# Patient Record
Sex: Female | Born: 1965
Health system: Southern US, Community
[De-identification: ages and names within clinical notes are randomized; demographics above are authoritative.]

## PROBLEM LIST (undated history)

## (undated) DIAGNOSIS — F329 Major depressive disorder, single episode, unspecified: Secondary | ICD-10-CM

## (undated) DIAGNOSIS — S83206A Unspecified tear of unspecified meniscus, current injury, right knee, initial encounter: Secondary | ICD-10-CM

## (undated) DIAGNOSIS — R112 Nausea with vomiting, unspecified: Secondary | ICD-10-CM

## (undated) DIAGNOSIS — M5412 Radiculopathy, cervical region: Secondary | ICD-10-CM

## (undated) DIAGNOSIS — F419 Anxiety disorder, unspecified: Secondary | ICD-10-CM

## (undated) DIAGNOSIS — N809 Endometriosis, unspecified: Secondary | ICD-10-CM

## (undated) DIAGNOSIS — F988 Other specified behavioral and emotional disorders with onset usually occurring in childhood and adolescence: Secondary | ICD-10-CM

## (undated) DIAGNOSIS — I10 Essential (primary) hypertension: Secondary | ICD-10-CM

## (undated) DIAGNOSIS — Z973 Presence of spectacles and contact lenses: Secondary | ICD-10-CM

## (undated) DIAGNOSIS — N3010 Interstitial cystitis (chronic) without hematuria: Secondary | ICD-10-CM

## (undated) DIAGNOSIS — F32A Depression, unspecified: Secondary | ICD-10-CM

## (undated) DIAGNOSIS — M1990 Unspecified osteoarthritis, unspecified site: Secondary | ICD-10-CM

## (undated) DIAGNOSIS — Z9889 Other specified postprocedural states: Secondary | ICD-10-CM

## (undated) DIAGNOSIS — M23221 Derangement of posterior horn of medial meniscus due to old tear or injury, right knee: Secondary | ICD-10-CM

## (undated) HISTORY — DX: Other specified postprocedural states: Z98.890

## (undated) HISTORY — PX: TOTAL ABDOMINAL HYSTERECTOMY W/ BILATERAL SALPINGOOPHORECTOMY: SHX83

## (undated) HISTORY — DX: Anxiety disorder, unspecified: F41.9

## (undated) HISTORY — PX: DIAGNOSTIC LAPAROSCOPY: SUR761

## (undated) HISTORY — DX: Derangement of posterior horn of medial meniscus due to old tear or injury, right knee: M23.221

## (undated) HISTORY — DX: Depression, unspecified: F32.A

## (undated) HISTORY — DX: Interstitial cystitis (chronic) without hematuria: N30.10

## (undated) HISTORY — DX: Radiculopathy, cervical region: M54.12

## (undated) HISTORY — PX: CYSTOSCOPY WITH HYDRODISTENSION AND BIOPSY: SHX5127

## (undated) HISTORY — PX: REDUCTION MAMMAPLASTY: SUR839

## (undated) HISTORY — PX: KNEE ARTHROSCOPY: SUR90

## (undated) HISTORY — PX: BLADDER SURGERY: SHX569

## (undated) HISTORY — DX: Major depressive disorder, single episode, unspecified: F32.9

## (undated) HISTORY — DX: Other specified behavioral and emotional disorders with onset usually occurring in childhood and adolescence: F98.8

## (undated) HISTORY — PX: SHOULDER ARTHROSCOPY: SHX128

---
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
## 2000-08-11 ENCOUNTER — Ambulatory Visit (HOSPITAL_COMMUNITY): Admission: RE | Admit: 2000-08-11 | Discharge: 2000-08-11 | Payer: Self-pay | Admitting: Obstetrics and Gynecology

## 2000-08-11 ENCOUNTER — Other Ambulatory Visit: Admission: RE | Admit: 2000-08-11 | Discharge: 2000-08-11 | Payer: Self-pay | Admitting: Obstetrics and Gynecology

## 2000-08-11 ENCOUNTER — Encounter: Payer: Self-pay | Admitting: Obstetrics and Gynecology

## 2003-11-10 ENCOUNTER — Encounter: Admission: RE | Admit: 2003-11-10 | Discharge: 2003-11-10 | Payer: Self-pay | Admitting: Family Medicine

## 2004-01-17 ENCOUNTER — Ambulatory Visit (HOSPITAL_COMMUNITY): Admission: RE | Admit: 2004-01-17 | Discharge: 2004-01-17 | Payer: Self-pay | Admitting: Urology

## 2004-01-17 ENCOUNTER — Ambulatory Visit (HOSPITAL_BASED_OUTPATIENT_CLINIC_OR_DEPARTMENT_OTHER): Admission: RE | Admit: 2004-01-17 | Discharge: 2004-01-17 | Payer: Self-pay | Admitting: Urology

## 2005-05-22 ENCOUNTER — Other Ambulatory Visit: Admission: RE | Admit: 2005-05-22 | Discharge: 2005-05-22 | Payer: Self-pay | Admitting: Family Medicine

## 2005-06-16 ENCOUNTER — Encounter: Admission: RE | Admit: 2005-06-16 | Discharge: 2005-06-16 | Payer: Self-pay | Admitting: Orthopedic Surgery

## 2006-04-14 ENCOUNTER — Ambulatory Visit (HOSPITAL_COMMUNITY): Admission: RE | Admit: 2006-04-14 | Discharge: 2006-04-14 | Payer: Self-pay | Admitting: Urology

## 2006-05-28 ENCOUNTER — Ambulatory Visit (HOSPITAL_COMMUNITY): Admission: RE | Admit: 2006-05-28 | Discharge: 2006-05-28 | Payer: Self-pay | Admitting: Obstetrics and Gynecology

## 2007-05-19 ENCOUNTER — Encounter (INDEPENDENT_AMBULATORY_CARE_PROVIDER_SITE_OTHER): Payer: Self-pay | Admitting: Obstetrics and Gynecology

## 2007-05-19 ENCOUNTER — Inpatient Hospital Stay (HOSPITAL_COMMUNITY): Admission: RE | Admit: 2007-05-19 | Discharge: 2007-05-21 | Payer: Self-pay | Admitting: Obstetrics and Gynecology

## 2008-03-07 ENCOUNTER — Ambulatory Visit (HOSPITAL_COMMUNITY): Admission: RE | Admit: 2008-03-07 | Discharge: 2008-03-07 | Payer: Self-pay | Admitting: Family Medicine

## 2008-03-14 ENCOUNTER — Encounter: Admission: RE | Admit: 2008-03-14 | Discharge: 2008-03-14 | Payer: Self-pay | Admitting: Family Medicine

## 2008-10-18 ENCOUNTER — Ambulatory Visit (HOSPITAL_COMMUNITY): Admission: RE | Admit: 2008-10-18 | Discharge: 2008-10-18 | Payer: Self-pay | Admitting: Orthopaedic Surgery

## 2008-11-02 ENCOUNTER — Ambulatory Visit (HOSPITAL_BASED_OUTPATIENT_CLINIC_OR_DEPARTMENT_OTHER): Admission: RE | Admit: 2008-11-02 | Discharge: 2008-11-02 | Payer: Self-pay | Admitting: Orthopaedic Surgery

## 2009-04-17 ENCOUNTER — Ambulatory Visit (HOSPITAL_COMMUNITY): Admission: RE | Admit: 2009-04-17 | Discharge: 2009-04-17 | Payer: Self-pay | Admitting: Family Medicine

## 2009-05-07 ENCOUNTER — Emergency Department (HOSPITAL_COMMUNITY): Admission: EM | Admit: 2009-05-07 | Discharge: 2009-05-07 | Payer: Self-pay | Admitting: Emergency Medicine

## 2009-06-25 ENCOUNTER — Encounter: Admission: RE | Admit: 2009-06-25 | Discharge: 2009-06-25 | Payer: Self-pay | Admitting: Orthopedic Surgery

## 2009-07-30 ENCOUNTER — Other Ambulatory Visit: Admission: RE | Admit: 2009-07-30 | Discharge: 2009-07-30 | Payer: Self-pay | Admitting: Family Medicine

## 2009-07-30 LAB — HM PAP SMEAR: HM Pap smear: NEGATIVE

## 2009-08-07 ENCOUNTER — Ambulatory Visit (HOSPITAL_BASED_OUTPATIENT_CLINIC_OR_DEPARTMENT_OTHER): Admission: RE | Admit: 2009-08-07 | Discharge: 2009-08-07 | Payer: Self-pay | Admitting: Orthopaedic Surgery

## 2009-08-22 ENCOUNTER — Encounter: Admission: RE | Admit: 2009-08-22 | Discharge: 2009-11-16 | Payer: Self-pay | Admitting: Orthopaedic Surgery

## 2009-11-15 ENCOUNTER — Ambulatory Visit (HOSPITAL_BASED_OUTPATIENT_CLINIC_OR_DEPARTMENT_OTHER): Admission: RE | Admit: 2009-11-15 | Discharge: 2009-11-15 | Payer: Self-pay | Admitting: Orthopaedic Surgery

## 2009-11-15 HISTORY — PX: CLOSED MANIPULATION SHOULDER: SUR205

## 2010-04-22 ENCOUNTER — Ambulatory Visit (HOSPITAL_COMMUNITY): Admission: RE | Admit: 2010-04-22 | Discharge: 2010-04-22 | Payer: Self-pay | Admitting: Family Medicine

## 2010-08-08 ENCOUNTER — Emergency Department (HOSPITAL_COMMUNITY): Admission: EM | Admit: 2010-08-08 | Discharge: 2010-08-08 | Payer: Self-pay | Admitting: Family Medicine
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
## 2011-01-14 ENCOUNTER — Other Ambulatory Visit (HOSPITAL_COMMUNITY): Payer: Self-pay | Admitting: Orthopaedic Surgery

## 2011-01-14 DIAGNOSIS — M75102 Unspecified rotator cuff tear or rupture of left shoulder, not specified as traumatic: Secondary | ICD-10-CM

## 2011-01-14 DIAGNOSIS — M25512 Pain in left shoulder: Secondary | ICD-10-CM

## 2011-02-03 ENCOUNTER — Ambulatory Visit (HOSPITAL_COMMUNITY)
Admission: RE | Admit: 2011-02-03 | Discharge: 2011-02-03 | Disposition: A | Discharge status: Home / Self Care | Payer: 59 | Source: Ambulatory Visit | Attending: Orthopaedic Surgery | Admitting: Orthopaedic Surgery

## 2011-02-03 ENCOUNTER — Other Ambulatory Visit (HOSPITAL_COMMUNITY): Payer: Self-pay

## 2011-02-03 DIAGNOSIS — X58XXXA Exposure to other specified factors, initial encounter: Secondary | ICD-10-CM | POA: Insufficient documentation

## 2011-02-03 DIAGNOSIS — S43439A Superior glenoid labrum lesion of unspecified shoulder, initial encounter: Secondary | ICD-10-CM | POA: Insufficient documentation

## 2011-02-03 DIAGNOSIS — M25519 Pain in unspecified shoulder: Secondary | ICD-10-CM | POA: Insufficient documentation

## 2011-02-03 DIAGNOSIS — M25512 Pain in left shoulder: Secondary | ICD-10-CM

## 2011-02-03 DIAGNOSIS — M75102 Unspecified rotator cuff tear or rupture of left shoulder, not specified as traumatic: Secondary | ICD-10-CM

## 2011-02-03 MED ORDER — GADOBENATE DIMEGLUMINE 529 MG/ML IV SOLN: 5.0000 mL | Freq: Once | INTRAVENOUS | Status: AC | PRN

## 2011-02-03 MED ORDER — IOHEXOL 300 MG/ML  SOLN
10.0000 mL | Freq: Once | INTRAMUSCULAR | Status: AC | PRN
  Administered 2011-02-03: 10 mL

## 2011-03-18 LAB — POCT HEMOGLOBIN-HEMACUE: Hemoglobin: 14.1 g/dL (ref 12.0–15.0)

## 2011-03-22 LAB — POCT HEMOGLOBIN-HEMACUE: Hemoglobin: 13.9 g/dL (ref 12.0–15.0)

## 2011-03-31 ENCOUNTER — Other Ambulatory Visit (HOSPITAL_COMMUNITY): Payer: Self-pay | Admitting: Family Medicine

## 2011-03-31 DIAGNOSIS — Z1231 Encounter for screening mammogram for malignant neoplasm of breast: Secondary | ICD-10-CM

## 2011-04-17 ENCOUNTER — Ambulatory Visit (HOSPITAL_BASED_OUTPATIENT_CLINIC_OR_DEPARTMENT_OTHER)
Admission: RE | Admit: 2011-04-17 | Discharge: 2011-04-17 | Disposition: A | Discharge status: Home / Self Care | Payer: 59 | Source: Ambulatory Visit | Attending: Orthopaedic Surgery | Admitting: Orthopaedic Surgery

## 2011-04-17 DIAGNOSIS — S43439A Superior glenoid labrum lesion of unspecified shoulder, initial encounter: Secondary | ICD-10-CM | POA: Insufficient documentation

## 2011-04-17 DIAGNOSIS — M7500 Adhesive capsulitis of unspecified shoulder: Secondary | ICD-10-CM | POA: Insufficient documentation

## 2011-04-17 DIAGNOSIS — X500XXA Overexertion from strenuous movement or load, initial encounter: Secondary | ICD-10-CM | POA: Insufficient documentation

## 2011-04-17 DIAGNOSIS — M67919 Unspecified disorder of synovium and tendon, unspecified shoulder: Secondary | ICD-10-CM | POA: Insufficient documentation

## 2011-04-17 DIAGNOSIS — Z01812 Encounter for preprocedural laboratory examination: Secondary | ICD-10-CM | POA: Insufficient documentation

## 2011-04-17 DIAGNOSIS — M719 Bursopathy, unspecified: Secondary | ICD-10-CM | POA: Insufficient documentation

## 2011-04-17 LAB — POCT HEMOGLOBIN-HEMACUE: Hemoglobin: 15.7 g/dL — ABNORMAL HIGH (ref 12.0–15.0)
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
## 2011-04-29 NOTE — Op Note (Signed)
NAME:  KAMERA, DUBAS                ACCOUNT NO.:  [id number]   MEDICAL RECORD NO.:  [id number]          PATIENT TYPE:  AMB   LOCATION:  DSC                          FACILITY:  [facility]   PHYSICIAN:  Claude Manges. Whitfield, M.D.DATE OF BIRTH:  [birth year]   DATE OF PROCEDURE:  11/02/2008  DATE OF DISCHARGE:                               OPERATIVE REPORT   PREOPERATIVE DIAGNOSES:  1. Possible tear of medial meniscus, left knee post.  2. Possible symptomatic medial shelf plica, left knee.   POSTOPERATIVE DIAGNOSES:  1. Tear on the knee surface of posterior horn of medial meniscus, left      knee.  2. Thickened medial shelf plica, left knee.   PROCEDURES:  1. Diagnostic arthroscopy, left knee.  2. Partial medial meniscectomy.  3. Plicectomy.   SURGEON:  Claude Manges. Whitfield, MD   ANESTHESIA:  IV sedation and local 1% Xylocaine with epinephrine.   COMPLICATIONS:  None.   HISTORY:  A 45 year old female has been experiencing pain along the  medial aspect of her left knee for approximately 6 weeks.  She has had  difficulty with running and is experiencing pain persistently along the  posterior medial aspect of her knee.  She has had some clicking, but no  sensation of her knee giving way.  She has had a cortisone injection at  time and anti-inflammatory medicines and still is having considerable  discomfort.  By exam, she is having posterior medial joint pain without  evidence of instability.   MRI scan was performed without contrast.  There was some tendinopathy of  the distal patellar tendon, but otherwise, there were no bony  abnormalities or evidence of meniscal or ligament pathology with  persistent discomfort.  She is now to have an arthroscopic evaluation.   PROCEDURE:  With the patient comfortable on operating table and under IV  sedation, the left lower extremity was placed in a thigh holder.  Leg  was then prepped with DuraPrep from thigh holder to ankle.  Sterile  draping was  performed.   Diagnostic arthroscopy was performed using a medial and lateral  parapatellar tendon puncture site.  The arthroscope was placed in a  lateral portal.  There was no evidence of an effusion.   Diagnostic arthroscopy revealed an intact cartilage above the patella.  There was some synovitis in the superior pouch, particularly medially  where there was a thickened plica that coursed along the medial gutter.  This was debrided with the ArthroCare wand back to stable capsule and  synovium.  There was no chondromalacia of the trochlear.   The lateral compartment was devoid of meniscal pathology or loose  bodies.  I did not see any appreciable chondromalacia.   The ACL appeared to be intact.  There were areas of synovitis on the  medial compartment and particularly anteriorly, these were debrided with  the ArthroCare wand.  I carefully evaluated the joint and I found  underneath surfaced tear at the junction of the middle and posterior  thirds of the meniscus.  It was very close to being through-and-through,  but it was definitely  ragged and was approximately a centimeter in  length.  I elected to debride this with a partial meniscectomy just  removing the meniscus back to that area and tapered it posteriorly and  anteriorly with the Cuda shaver and then the ArthroCare wand. So that I  could carefully probe the meniscus without evidence of any further  tearing or loose meniscus.  The joint was inspected without evidence of  loose material.  The 2 stab wounds were left open and infiltrated with  0.25% Marcaine with epinephrine.  A sterile bulky dressing was applied  followed by an Ace bandage.   PLAN:  Oxycodone for pain, aspirin a day, office Monday.      Claude Manges. Cleophas Dunker, M.D.  Electronically Signed     PWW/MEDQ  D:  11/02/2008  T:  11/03/2008  Job:  [id number]

## 2011-04-30 NOTE — Op Note (Signed)
NAME:  VALENTINE, KUECHLE NO.:  [id number]  MEDICAL RECORD NO.:  [id number]          PATIENT TYPE:  LOCATION:                                 FACILITY:  PHYSICIAN:  Claude Manges. Adriane Guglielmo, M.D.DATE OF BIRTH:  1966/08/07  DATE OF PROCEDURE:  04/17/2011 DATE OF DISCHARGE:                              OPERATIVE REPORT   PREOPERATIVE DIAGNOSIS:  Tear of anterior-superior glenoid labrum left shoulder with subacromial bursitis.  POSTOPERATIVE DIAGNOSES: 1. Adhesive capsulitis, left shoulder. 2. Subacromial bursitis. 3. Bucket-handle tear of glenoid labrum.  PROCEDURE: 1. Evaluation of left shoulder under anesthesia with manipulation. 2. Diagnostic arthroscopy, left shoulder with debridement of bucket     handle portion of labral tear. 3. Arthroscopic anterior glenoid labral repair. 4. Arthroscopic subacromial bursectomy.  SURGEON:  Claude Manges. Cleophas Dunker, MD  ASSISTANT:  Oris Drone. Petrarca, PA-C  ANESTHESIA:  General with supplemental interscalene nerve block.  COMPLICATIONS:  None.  HISTORY:  A 45 year old female sustained acute onset of left shoulder pain while performing a hand stand in January.  She felt immediate onset of something "tearing" in her shoulder.  She has had a subsequent MRI scan revealing a tear of the superior and anterior labrum and with persistent discomfort wishes to proceed with arthroscopic evaluation.  PROCEDURE:  The patient was evaluated in the holding area.  She did receive a preoperative interscalene nerve block, identified the left shoulder as appropriate operative extremity.  The patient was then transported to room #5 and placed under general orotracheal anesthesia and placed in a semi-sitting position with the shoulder frame. Evaluation of the shoulder was then performed.  There was considerable adhesive capsulitis lacking about 45 degrees to full overhead motion with loss of internal-external rotation and abduction.  Manipulation  was performed with soft lysis of adhesions, allowing then full range of motion.  The shoulder was then prepped with DuraPrep and the base of the neck circumferentially below the elbow.  Sterile draping was performed.  Marking pen was then used to outline the [facility] joint.  The coracoid and the acromion had a pointed fingerbreadth posterior and medial to the posterior angle of acromion, a small stab wound was made.  The arthroscope was then easily placed in the shoulder joint.  Hemarthrosis was evacuated from the manipulation.  Diagnostic arthroscopy revealed a bucket-handle tear of the labrum.  The injury occurred in January and there was some scarring.  It appeared as though the tear was posteriorly with one limb just anterior to the biceps tendon attachment and the other at about 4 o'clock posteriorly, but the remainder of the posterior labrum appeared to be intact.  There was more of an irregular appearance of the remaining anterior labrum, but it looked otherwise intact, so it was difficult to know if this was a bucket-handle tear of either the anterior posterior labrum that had flipped and then reattached.  In any rate, second portal was established anteriorly and I debrided the bucket- handle portion as it was in a white-white zone.  I then switched the scope from the posterior to the anterior portal and evaluated the posterior labrum and it  appeared to be otherwise intact.  There was tearing just at the biceps anchor and extending slightly posteriorly, so I elected to repair that with the Arthrex PushLock anchor to maintain a scope to the anterior portal.  A larger cannula was then inserted posteriorly with switching sticks and the PushLock anchor was then inserted.  A very nice purchase on the labrum and subsequently had nice purchase within the superior glenoid rim.  After insertion of the PushLock anchor, I then probed the labrum with an excellent repair and it was nicely  adherent to the glenoid.  I then reevaluated the joint through the anterior portal and the arthroscope and felt that remainder of the joint looked just fine.  I then inserted the arthroscope in the subacromial space posteriorly, the cannula in the subacromial space anteriorly, and third portal established in lateral subacromial space.  An arthroscopic subacromial bursectomy was performed.  The [facility] joint had a nice capsule, it did not appear to be prominent, I did not see any significant inflammatory tissue and felt that the acromion was a type 1 without anterior overhang or spur formation.  The wound was then irrigated with saline solution.  The anterior and posterior portals were closed with interrupted 4-0 Ethilon.  Sterile bulky dressing was applied followed by a sling.  PLAN:  Oxycodone for pain this 1 week.     Claude Manges. Cleophas Dunker, M.D.     PWW/MEDQ  D:  04/17/2011  T:  04/17/2011  Job:  [id number]  Electronically Signed by Norlene Campbell M.D. on 04/30/2011 01:41:12 PM

## 2011-05-02 NOTE — Discharge Summary (Signed)
NAME:  [name] [name], [name] [name]                ACCOUNT NO.:  [id number]   MEDICAL RECORD NO.:  [id number]          PATIENT TYPE:  INP   LOCATION:  9314                          [facility]ACILITY:  [facility]   PHYSICIAN:  Charles A. Delcambre, MDDATE OF BIRTH:  1966-11-01   DATE OF ADMISSION:  05/19/2007  DATE OF DISCHARGE:  05/21/2007                               DISCHARGE SUMMARY   PRIMARY DISCHARGE DIAGNOSIS:  1. Endometriosis with pelvic pain.  2. Adenomyosis.   PROCEDURE:  Transabdominal hysterectomy and bilateral salpingo-  oophorectomy.   DISPOSITION:  The patient was discharged home to follow up in the office  in 48 hours to discontinue staples.   She was given a prescription for:  1. Vicodin 1-2 p.o. q.4 h. p.r.n., #40.  2. Motrin 800 mg one p.o. q.8 h. p.r.n., #30, refill x1.  3. Instructed to use over-the-counter Colace and laxative that      evening.   She was instructed to call for any temperature greater than 100 degrees,  incision redness or drainage, increased pain or vaginal bleeding.  She  is instructed no driving for 2 weeks, bath for 2 weeks, and no lifting  greater than 25 pounds for 1 month.   LABORATORY:  As noted above.  Postoperative hemoglobin 11.3, hematocrit  32.7.  Pathology did return as diagnosed above, adenomyosis.  There was  gross endometriosis in the pelvis noted at the time of operation.   HISTORY AND PHYSICAL:  Dictated on the chart.   HOSPITAL COURSE:  The patient was admitted and underwent surgeries as  noted above.  Postoperatively she had routine pain course, routine post-  op course.  PCA was used for Dilaudid for pain control overnight.  Post-  op day #1, this was stopped and she was given p.o. Percocet.  She seemed  to tolerate Vicodin better.  So, we switched to Vicodin on post-op day  #2.  Colace was started and surgery was explained on post-op day #1.  On  post-op day #2, she was doing well, spontaneous flatus, no bowel  movement, pain was controlled.   She was having no difficulty.  She was  tolerating her diet.  She had some itching on Percocet, therefore, was  changed that day to Vicodin as noted above.  Vitals were stable.  She  was discharged home to follow up in the office as noted above.      [name], MD  Electronically Signed     CAD/MEDQ  D:  06/24/2007  T:  06/24/2007  Job:  [id number]
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
## 2011-05-02 NOTE — H&P (Signed)
NAME:  [name] [name], [name] [name]                ACCOUNT NO.:  [id number]   MEDICAL RECORD NO.:  [id number]          PATIENT TYPE:  AMB   LOCATION:  SDC                           [facility]ACILITY[facility]  [facility]   PHYSICIAN:  Charles A. Delcambre, MDDATE OF BIRTH:  Dec 07, 1966   DATE OF ADMISSION:  05/19/2007  DATE OF DISCHARGE:                              HISTORY & PHYSICAL   HISTORY OF PRESENT ILLNESS:  The patient is to be admitted on May 19, 2007 to undergo transabdominal hysterectomy, bilateral salpingo-  oophorectomy for pelvic pain and known endometriosis. She give informed  consent. Accepts the risks of bowel and bladder damage, blood product  risks including hepatitis and HIV exposure, menopause onset, possible  hormone replacement therapy, DVT. Anesthesia risks will be discussed  with the anesthesiologist. She understands that I cannot guarantee 100%  pain relief. She has stopped taking Ginkgo biloba, which would put her  at increased risk for bleeding, 10 days ago.   PAST MEDICAL HISTORY:  Interstitial cystitis, endometriosis.   PAST SURGICAL HISTORY:  Breast reduction in 1988. Laparoscopy in 1988,  endometriosis noted. Knee surgery in 1996. Diagnostic laparoscopy  showing endometriosis again on May 28, 2006.   MEDICATIONS:  BuSpar dose not specified, once daily.   ALLERGIES:  NO KNOWN DRUG ALLERGIES.   SOCIAL HISTORY:  1 pack per day cigarette smoking. She has cut back. No  alcohol or drug use. No STD exposure in the past. She is married in a  monogamous relationship with her husband.   FAMILY HISTORY:  Father with hypertension. Otherwise, denies family  history of breast, uterus, ovary, cervix, colon cancer, lymphoma,  coronary artery disease, stroke, or diabetes.   REVIEW OF SYSTEMS:  She has problems with right lower quadrant pain  continuing and dyspareunia developing now. She denies fever, chills,  rashes, lesions, headaches, dizziness, seasonal allergies, chest pain,  shortness of  breath, wheezing, diarrhea, constipation, bleeding, melena,  hematochezia, urgency, frequency, dysuria, incontinence, hematuria,  emotional changes. When she needs to urinate, she needs to go quickly  and some pain associated with interstitial cystitis in this regard.   PHYSICAL EXAMINATION:  GENERAL:  Alert and oriented times three. No  distress.  VITAL SIGNS:  Blood pressure 128/78, respiratory rate 24, heart rate 72.  Afebrile.  HEENT:  Examination grossly within normal limits.  NECK:  Supple without thyromegaly or adenopathy.  LUNGS:  Clear bilaterally.  BACK:  No CVAT, vertebral bodies nontender to palpation.  BREAST:  No masses, tenderness, or discharge.  SKIN:  No changes bilaterally.  ABDOMEN:  Soft, flat, and nontender. No hepatosplenomegaly or masses  noted. Lower quadrant is minimally tender to deep palpation.  HEART:  Regular rate and rhythm. Without murmur, rub, or gallop.  PELVIC:  Normal external genitalia. Bartholin, urethral, Skene within  normal limits. Vulva without discharge or lesions. No cervical motion  tenderness. Nulliparous cervix. Uterus not enlarged. Adnexa nontender  without masses. Ovaries palpable, normal size bilaterally. Posterior cul-  de-sac nodularities palpable and reproduces dyspareunia pain, consistent  with endometriosis.   I discussed LAVH versus vaginal hysteroscopy and BSO, but  as there are  multiple lesions seen with the laparoscopy, she wishes to undergo  transabdominal approach.   ASSESSMENT:  Pelvic pain and endometriosis, dyspareunia.   PLAN:  Transabdominal hysterectomy and bilateral salpingo-oophorectomy  as noted above. Informed consent is given. Preoperative H&P is dictated.  CBC, BMP, and SCD's will be used perioperatively. Serum HCG will be done  as well. NPO past midnight, evening prior to surgery.      [name], MD  Electronically Signed     CAD/MEDQ  D:  05/11/2007  T:  05/11/2007  Job:  [id number]

## 2011-05-02 NOTE — H&P (Signed)
NAME:  [name] [name], [name] [name]                ACCOUNT NO.:  [id number]   MEDICAL RECORD NO.:  [id number]          PATIENT TYPE:  AMB   LOCATION:  [facility]                           FACILITY:  [facility]   PHYSICIAN:  Charles A. Delcambre, MDDATE OF BIRTH:  10/26/1966   DATE OF ADMISSION:  DATE OF DISCHARGE:                                HISTORY & PHYSICAL   HISTORY OF PRESENT ILLNESS:  A 45 year old para 2-0-0-2 complaining of  pelvic pain, mainly in the right lower quadrant, and a non-complex ovarian  cyst at 3.2 cm, felt to be hemorrhagic versus endometriosis.  This was seen  on ultrasound, and she states her pain over the last several months has not  gone away.  It was initially noted to be a cyst with the urologist, for  which she is managed with interstitial cystitis.  LMP on Apr 27, 2006.  The  pain is constant throughout the month.  Occasional sharp and stabbing pain  with increase in urination and increase with intercourse radiating around  the side to the back low lumbar area on the right.   PAST MEDICAL HISTORY:  Interstitial cystitis and endometriosis documented at  previously laparoscopy.   PAST SURGICAL HISTORY:  1.  Breast reduction in 1988.  2.  Laparoscopy in 1988.  Endometriosis noted.  3.  Knee surgery in 1996.   MEDICATIONS:  BuSpar once a day, dose unspecified.   ALLERGIES:  No known drug allergies.   SOCIAL HISTORY:  One pack per day of cigarettes smoking.  Denies alcohol or  drug use in the past.  The patient is married and in a monogamous  relationship with her husband.   FAMILY HISTORY:  Father with hypertension.  No family history of breast,  uterus, ovary, cervix, colon cancer or lymphoma.  No coronary artery  disease, stroke or diabetes.   REVIEW OF SYSTEMS:  Problems with right lower quadrant pain and complex  ovarian cyst.  CONSTITUTIONAL:  Denies fever or chills.  SKIN:  No new  rashes or lesions.  Denies headaches or dizziness.  Some seasonal allergies  are  present.  No chest pain or shortness of breath or wheezing.  No  diarrhea, constipation, bleeding or melena.  No urgency, frequency, dysuria,  incontinence, or hematuria.  No galacturia.  No emotional changes.   PHYSICAL EXAMINATION:  GENERAL:  Alert and oriented x3.  In no distress.  Mood and affect normal.  Development normal.  Nutrition normal.  Grooming  normal.  No deformities.  VITAL SIGNS:  Blood pressure 120/80, respirations 16, pulse 90.  Height 5  feet, 4.5 inches.  Weight 164 pounds.  HEENT:  Grossly within normal limits.  NECK:  Supple without thyromegaly or adenopathy.  LUNGS:  Clear bilaterally.  BACK:  No CVA tenderness.  BREASTS:  Symmetrical.  Otherwise deferred.  ABDOMEN:  Soft, flat, nontender in all quadrant, except right lower quadrant  and suprapubic area with some palpable tenderness, but no guarding or  rebound pain.  She states her pain is in the mid pubic area, as well as the  right lower quadrant.  There is some referred pain to the right adnexa when  examining the left adnexa.  The right adnexa - the ovary is palpable and  about 3 cm in size.  No appreciably enlarged size.  Very minimal tenderness  in palpating the ovary.  This does not reproduce her pain or her tenderness.  In palpating the bladder region again, there is pain that makes her have the  strong urgency to void.   ASSESSMENT:  1.  Pelvic pain, 625.9.  2.  Ovarian cyst versus hemorrhagic cyst, 236.2.  3.  Endometriosis likely, 617.9.  4.  Interstitial cystitis, 595.1.   PLAN:  I discussed management with ibuprofen and a trial of Lupron versus  laparoscopy.  The patient prefers proceeding on with laparoscopy, and she  gives informed consent.  Accepts the risks of infection, bleeding, bowel and  bladder and ureteral damage, blood product risks including hepatitis and HIV  risks.  I can give no 100% guarantee of a pain-free state.  Possible  oophorectomy or laparotomy if necessary has been  understood by the patient.  Informed consent is given.  She initially wanted a full procedure done the  day of surgery; however, we did not come to agreement on whether to proceed  on with a full procedure such as hysterectomy to be done on the day of  surgery.  However, she does consent to oophorectomy if this becomes  necessary.  I would prefer, if we are going to have to make an incision,  such as a Pfannenstiel incision, that we prefer to postpone this to a later  date and wait on a trial  of conservative procedure.  She was in agreement.  We will proceed as outlined.  CBC, type and screen, UCG will be done.  NPO  past midnight.      [name], MD  Electronically Signed     CAD/MEDQ  D:  05/27/2006  T:  05/27/2006  Job:  [id number]

## 2011-05-02 NOTE — Op Note (Signed)
NAME:  [name] [name], [name] [name]                ACCOUNT NO.:  [id number]   MEDICAL RECORD NO.:  [id number]          PATIENT TYPE:  AMB   LOCATION:  [facility]                           FACILITY:  [facility]   PHYSICIAN:  Charles A. Delcambre, MDDATE OF BIRTH:  09-08-1966   DATE OF PROCEDURE:  05/19/2007  DATE OF DISCHARGE:                               OPERATIVE REPORT   PREOPERATIVE DIAGNOSES:  1. Endometriosis.  2. Pelvic pain.  3. Dyspareunia, failing medical management.   POSTOPERATIVE DIAGNOSES:  1. Endometriosis.  2. Pelvic pain.  3. Dyspareunia, failing medical management.  4. Pelvic adhesive disease.   PROCEDURES:  1. Transabdominal hysterectomy and bilateral salpingo-oophorectomy.  2. Lysis of adhesions.   SURGEON:  [name], MD   ASSISTANT:  Gerald Leitz, MD   COMPLICATIONS:  None.   ESTIMATED BLOOD LOSS:  100 mL.   ANESTHESIA:  General by the endotracheal route.   SPECIMENS:  Uterus, tubes and ovaries to Pathology.   OPERATIVE FINDINGS:  Endometriosis implants of the superficial and  posterior cul-de-sac resected with the posterior aspect of the  peritoneum with closure of the cuff.  Thereafter, mild adhesions of the  sigmoid colon to the left adnexa.  Instrument, sponge and needle counts  were correct x2.   PROCEDURE:  The patient was taken to the operating room and placed in  the supine position.  A general anesthetic was induced without  difficulty and she was then sterilely prepped and draped.   A Pfannenstiel incision was made with a knife and carried down to the  fascia.  The fascia was incised with the knife and Mayo scissors.  The  rectus sheath was released superiorly and inferiorly with Mayo scissors  and electrocautery.  The rectus muscles were bluntly dissected in the  midline.  The peritoneum was tented up and entered without any damage to  the bowel and further extended with Metzenbaum scissors.  A Balfour  retractor was placed.  Three moist laps were used  to pack the bowel out  of the pelvis to get the bowel adequately mobilized on the left.  Careful dissection with Metzenbaum scissors was undertaken to free the  sigmoid colon up out of the pelvis.  This allowed mobilization with a  lap sponge to give adequate visualization in the pelvis.  The round  ligaments were transected on both sides with 0 Vicryl transfixation  stitches and opened with cautery.  The bladder was dropped down  anteriorly with Metzenbaum scissors.  The infundibulopelvic pedicles  were isolated clearly with ureters seen posteriorly on the inner aspect  of the peritoneum deep in the pelvis on both sides.  The  infundibulopelvic pedicles were taken with 0 Vicryl, a free tie and then  a transfixation stitch of 0 Vicryl.  Hemostasis was excellent.   Skeletonization of the bladder down in the lower uterine segment was  undertaken with Metzenbaum scissors.  A sponge forceps was used to push  the bladder down further.  This allowed skeletonization of the uterine  vessels on either side.  They were taken with 0 Vicryl simple stitch on  the left and a transfixation stitch on the right.  Hemostasis was  excellent.  Several remaining pedicles were taken down on either side  with a straight Valentine clamp and a transfixation stitch of 0 Vicryl.  The final of which was a curved Heaney across the uterosacral ligament  and vested into the vagina.  When cut, 0 Vicryl transfixation stitch was  placed, holding the uterosacral angle on either side.  The cervix was  amputated with Ocie Doyne scissors.  The cuff was then closed with  Richardson angle sutures of 0 Vicryl on either angle of the vagina.  A  running locking 0 Vicryl was then used to close the remainder of the  cuff without damage to surrounding tissue.  Irrigation was carried out  and hemostasis was verified at all points.  The peritoneum implants, as  grossly could be seen, were removed on the peritoneum prior to closure  of  the cuff, eliminating any visual evidence of any endometriosis  remaining after closure of the cuff.  The pedicle revealed good  hemostasis.  Irrigation was carried out.  All pedicles revealed good  hemostasis.   The round ligament and vaginal angle sutures were cut.  Laps were  removed.  The bladder blade was removed.  The Balfour retractor was  removed.  Minor electrocautery was used on the rectus muscle on the  right and, otherwise, subfascial hemostasis was excellent.  The fascia  was closed with #1 Vicryl running nonlocking suture.  Subcutaneous  irrigation was carried out and minor electrocautery was done.  The skin  was then closed in a subcuticular stitch of 3-0 Monocryl and Steri-  Strips were placed.   The patient was taken to recovery room in good condition, having  tolerated the procedure well.      [name], MD  Electronically Signed     CAD/MEDQ  D:  05/19/2007  T:  05/19/2007  Job:  [id number]

## 2011-05-02 NOTE — Op Note (Signed)
NAME:  [name] [name], DIVIRGILIO NO.:  [id number]   MEDICAL RECORD NO.:  [id number]                   PATIENT TYPE:  AMB   LOCATION:  [facility]                                 FACILITY:  [facility]   PHYSICIAN:  Valetta Fuller, M.D.               DATE OF BIRTH:  1965/12/27   DATE OF PROCEDURE:  01/17/2004  DATE OF DISCHARGE:                                 OPERATIVE REPORT   PREOPERATIVE DIAGNOSES:  1. Urinary frequency.  2. Pelvic pain.  3. Probable interstitial cystitis.   POSTOPERATIVE DIAGNOSES:  1. Urinary frequency.  2. Pelvic pain.  3. Probable interstitial cystitis.   PROCEDURE PERFORMED:  Cystoscopy, urethral dilation, hydraulic  overdistention of the bladder, instillation of Clorpactin, Pyridium, and  Marcaine.   SURGEON:  Valetta Fuller, M.D.   ANESTHESIA:  General.   INDICATIONS:  [name] is a 45 year old female who has had very long-  standing symptoms of urinary frequency, nocturia, and some urgency.  More  recently, she has had some intermittent pelvic pain and occasional  dyspareunia.  A CT scan has shown some small ovarian cystic disease but  nothing else specific within her pelvis.  We had been suspicious for  interstitial cystitis and recommended cystoscopy for both diagnostic, as  well as potentially therapeutic purposes.   TECHNIQUE AND FINDINGS:  The patient was brought to the operating room where  she had the successful induction of general anesthesia.  She was placed in  the lithotomy position and prepped and draped in the usual manner.  Urethral  dilation was performed to 26 [place].  She then underwent hydraulic  overdistention of the bladder for five minutes at 1 meter of water pressure.  Bladder capacity was measured at 550 mL, which was confirmed with a second  hydraulic overdistention.  The patient had diffuse, 3-4+ severe glomerular  hemorrhaging again throughout her whole bladder.  At the completion of the  hydraulic  overdistention, a red Robinson catheter was placed.  Clorpactin  was instilled under gravity drainage a few hundred mL at a time and then  drained out of her bladder.  We placed some Marcaine and Pyridium in her  bladder at the end of the procedure, and the patient received a BNO  suppository.  She was brought to the recovery room in a stable condition.                                               Valetta Fuller, M.D.    DSG/MEDQ  D:  01/17/2004  T:  01/17/2004  Job:  [id number]

## 2011-05-06 ENCOUNTER — Ambulatory Visit (HOSPITAL_COMMUNITY)
Admission: RE | Admit: 2011-05-06 | Discharge: 2011-05-06 | Disposition: A | Discharge status: Home / Self Care | Payer: 59 | Source: Ambulatory Visit | Attending: Family Medicine | Admitting: Family Medicine

## 2011-05-06 DIAGNOSIS — Z1231 Encounter for screening mammogram for malignant neoplasm of breast: Secondary | ICD-10-CM | POA: Insufficient documentation

## 2011-05-06 LAB — HM MAMMOGRAPHY: HM Mammogram: NEGATIVE

## 2011-08-04 ENCOUNTER — Encounter: Payer: Self-pay | Admitting: Family Medicine

## 2011-08-04 ENCOUNTER — Ambulatory Visit (INDEPENDENT_AMBULATORY_CARE_PROVIDER_SITE_OTHER): Payer: 59 | Admitting: Family Medicine

## 2011-08-04 VITALS — BP 116/80 | HR 67 | Ht 65.5 in | Wt 172.0 lb

## 2011-08-04 DIAGNOSIS — Z0000 Encounter for general adult medical examination without abnormal findings: Secondary | ICD-10-CM

## 2011-08-04 DIAGNOSIS — F909 Attention-deficit hyperactivity disorder, unspecified type: Secondary | ICD-10-CM

## 2011-08-04 DIAGNOSIS — N3010 Interstitial cystitis (chronic) without hematuria: Secondary | ICD-10-CM

## 2011-08-04 LAB — POCT URINALYSIS DIPSTICK
Bilirubin, UA: NEGATIVE
Blood, UA: NEGATIVE
Glucose, UA: NEGATIVE
Ketones, UA: NEGATIVE
Leukocytes, UA: NEGATIVE
Nitrite, UA: NEGATIVE
Protein, UA: NEGATIVE
Spec Grav, UA: 1.02
Urobilinogen, UA: NEGATIVE
pH, UA: 6

## 2011-08-04 MED ORDER — HYDROXYZINE HCL 25 MG PO TABS: ORAL_TABLET | ORAL | Status: DC

## 2011-08-04 NOTE — Patient Instructions (Signed)
Heat to your right elbow for 20 minutes 3 times per day. Do as many things as you can palms up and open. If it keeps bothering you, come back for possible injection

## 2011-08-04 NOTE — Progress Notes (Signed)
  Subjective:    Patient ID: [name] [name], female    DOB: 1966-09-13, 45 y.o.   MRN: [id number]  HPI She is here for a complete examination. She does have a history of ADD and presently is on  Vyvanse. She also has a history of interstitial cystitis and presently is on hydroxyzine for this. She has not seen her urologist in quite some time. She states again she gets up 3 or 4 times per night to urinate but upon further questioning it is unclear as to whether she is truly getting up because of bladder issues or other concerns. She is under a lot of stress dealing with her husband and his alcohol issues. Her son is in a rehabilitation program and apparently is doing quite well. She has had a hysterectomy and is on hormone replacement.   Review of Systems Negative except as above    Objective:   Physical Exam BP 116/80  Pulse 67  Ht 5' 5.5" (1.664 m)  Wt 172 lb (78.019 kg)  BMI 28.19 kg/m2  General Appearance:    Alert, cooperative, no distress, appears stated age  Head:    Normocephalic, without obvious abnormality, atraumatic  Eyes:    PERRL, conjunctiva/corneas clear, EOM's intact, fundi    benign  Ears:    Normal TM's and external ear canals  Nose:   Nares normal, mucosa normal, no drainage or sinus   tenderness  Throat:   Lips, mucosa, and tongue normal; teeth and gums normal  Neck:   Supple, no lymphadenopathy;  thyroid:  no   enlargement/tenderness/nodules; no carotid   bruit or JVD  Back:    Spine nontender, no curvature, ROM normal, no CVA     tenderness  Lungs:     Clear to auscultation bilaterally without wheezes, rales or     ronchi; respirations unlabored  Chest Wall:    No tenderness or deformity   Heart:    Regular rate and rhythm, S1 and S2 normal, no murmur, rub   or gallop  Breast Exam:    Deferred to GYN  Abdomen:     Soft, non-tender, nondistended, normoactive bowel sounds,    no masses, no hepatosplenomegaly  Genitalia:    Deferred to GYN     Extremities:   No  clubbing, cyanosis or edema  Pulses:   2+ and symmetric all extremities  Skin:   Skin color, texture, turgor normal, no rashes or lesions  Lymph nodes:   Cervical, supraclavicular, and axillary nodes normal  Neurologic:   CNII-XII intact, normal strength, sensation and gait; reflexes 2+ and symmetric throughout          Psych:   Normal mood, affect, hygiene and grooming.           Assessment & Plan:  ADD. Interstitial cystitis. Situational anxiety. I had a long discussion with her concerning the anxiety and how she is handling this. She recognizes that to a certain extent she is an inhaler with her husband. I strongly encouraged her to get involved in counseling. Will also send her to urology for a local on her interstitial cystitis. We will readdress he sleep issue after she has had a chance to clear up the interstitial cystitis. I will renew her hydroxyzine at least for the time being.

## 2011-08-05 ENCOUNTER — Telehealth: Payer: Self-pay

## 2011-08-05 LAB — CBC WITH DIFFERENTIAL/PLATELET
Basophils Absolute: 0 10*3/uL (ref 0.0–0.1)
Basophils Relative: 0 % (ref 0–1)
Eosinophils Absolute: 0.1 10*3/uL (ref 0.0–0.7)
Eosinophils Relative: 2 % (ref 0–5)
HCT: 44 % (ref 36.0–46.0)
Hemoglobin: 14.6 g/dL (ref 12.0–15.0)
Lymphocytes Relative: 39 % (ref 12–46)
Lymphs Abs: 2.7 10*3/uL (ref 0.7–4.0)
MCH: 30.6 pg (ref 26.0–34.0)
MCHC: 33.2 g/dL (ref 30.0–36.0)
MCV: 92.2 fL (ref 78.0–100.0)
Monocytes Absolute: 0.5 10*3/uL (ref 0.1–1.0)
Monocytes Relative: 7 % (ref 3–12)
Neutro Abs: 3.6 10*3/uL (ref 1.7–7.7)
Neutrophils Relative %: 52 % (ref 43–77)
Platelets: 243 10*3/uL (ref 150–400)
RBC: 4.77 MIL/uL (ref 3.87–5.11)
RDW: 12.8 % (ref 11.5–15.5)
WBC: 7 10*3/uL (ref 4.0–10.5)

## 2011-08-05 LAB — COMPREHENSIVE METABOLIC PANEL
ALT: 29 U/L (ref 0–35)
AST: 25 U/L (ref 0–37)
Albumin: 4.5 g/dL (ref 3.5–5.2)
Alkaline Phosphatase: 49 U/L (ref 39–117)
BUN: 14 mg/dL (ref 6–23)
CO2: 24 mEq/L (ref 19–32)
Calcium: 8.8 mg/dL (ref 8.4–10.5)
Chloride: 107 mEq/L (ref 96–112)
Creat: 0.92 mg/dL (ref 0.50–1.10)
Glucose, Bld: 92 mg/dL (ref 70–99)
Potassium: 4.2 mEq/L (ref 3.5–5.3)
Sodium: 136 mEq/L (ref 135–145)
Total Bilirubin: 0.4 mg/dL (ref 0.3–1.2)
Total Protein: 6.9 g/dL (ref 6.0–8.3)

## 2011-08-05 LAB — LIPID PANEL
Cholesterol: 154 mg/dL (ref 0–200)
HDL: 44 mg/dL (ref >39)
LDL Cholesterol: 96 mg/dL (ref 0–99)
Total CHOL/HDL Ratio: 3.5 Ratio
Triglycerides: 70 mg/dL (ref <150)
VLDL: 14 mg/dL (ref 0–40)

## 2011-08-05 NOTE — Telephone Encounter (Signed)
Called pt to inform blood work looks good left message

## 2011-08-22 ENCOUNTER — Telehealth: Payer: Self-pay

## 2011-08-22 ENCOUNTER — Encounter: Payer: Self-pay | Admitting: Family Medicine

## 2011-08-22 NOTE — Telephone Encounter (Signed)
Left message for pt she needs appt per jcl

## 2011-09-02 ENCOUNTER — Ambulatory Visit (INDEPENDENT_AMBULATORY_CARE_PROVIDER_SITE_OTHER): Payer: 59 | Admitting: Family Medicine

## 2011-09-02 ENCOUNTER — Encounter: Payer: Self-pay | Admitting: Family Medicine

## 2011-09-02 VITALS — BP 130/86 | HR 80 | Wt 172.0 lb

## 2011-09-02 DIAGNOSIS — F909 Attention-deficit hyperactivity disorder, unspecified type: Secondary | ICD-10-CM

## 2011-09-02 DIAGNOSIS — S40022A Contusion of left upper arm, initial encounter: Secondary | ICD-10-CM

## 2011-09-02 DIAGNOSIS — F419 Anxiety disorder, unspecified: Secondary | ICD-10-CM

## 2011-09-02 DIAGNOSIS — S40029A Contusion of unspecified upper arm, initial encounter: Secondary | ICD-10-CM

## 2011-09-02 DIAGNOSIS — F341 Dysthymic disorder: Secondary | ICD-10-CM

## 2011-09-02 DIAGNOSIS — F32A Depression, unspecified: Secondary | ICD-10-CM

## 2011-09-02 MED ORDER — BUPROPION HCL ER (SR) 150 MG PO TB12: 150.0000 mg | ORAL_TABLET | Freq: Two times a day (BID) | ORAL | Status: DC

## 2011-09-02 MED ORDER — LISDEXAMFETAMINE DIMESYLATE 30 MG PO CAPS: 30.0000 mg | ORAL_CAPSULE | ORAL | Status: DC

## 2011-09-02 NOTE — Patient Instructions (Signed)
Take the Wellbutrin  once per day for the first week and then start on twice a day. If you have any symptoms all call me .
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
## 2011-09-10 ENCOUNTER — Telehealth: Payer: Self-pay | Admitting: *Deleted

## 2011-09-10 NOTE — Telephone Encounter (Signed)
Done

## 2011-09-16 LAB — POCT HEMOGLOBIN-HEMACUE: Hemoglobin: 14.2

## 2011-09-24 ENCOUNTER — Telehealth: Payer: Self-pay | Admitting: Family Medicine

## 2011-09-24 MED ORDER — EST ESTROGENS-METHYLTEST 0.625-1.25 MG PO TABS: 1.0000 | ORAL_TABLET | Freq: Every day | ORAL | Status: DC

## 2011-09-25 NOTE — Telephone Encounter (Signed)
DONE

## 2011-10-02 LAB — CBC
HCT: 32.7 — ABNORMAL LOW
HCT: 37.2
Hemoglobin: 11.3 — ABNORMAL LOW
Hemoglobin: 12.8
MCHC: 34.4
MCHC: 34.6
MCV: 91.1
MCV: 91.8
Platelets: 181
Platelets: 236
RBC: 3.56 — ABNORMAL LOW
RBC: 4.09
RDW: 12.3
RDW: 12.7
WBC: 12.4 — ABNORMAL HIGH
WBC: 6.7

## 2011-10-02 LAB — BASIC METABOLIC PANEL
BUN: 10
CO2: 26
Calcium: 9.1
Chloride: 103
Creatinine, Ser: 0.79
GFR calc Af Amer: >60
GFR calc non Af Amer: >60
Glucose, Bld: 97
Potassium: 4
Sodium: 136

## 2011-10-02 LAB — TYPE AND SCREEN
ABO/RH(D): A POS
Antibody Screen: NEGATIVE

## 2011-10-02 LAB — HCG, SERUM, QUALITATIVE: Preg, Serum: NEGATIVE

## 2011-10-06 ENCOUNTER — Encounter: Payer: Self-pay | Admitting: Family Medicine

## 2011-10-07 ENCOUNTER — Encounter: Payer: Self-pay | Admitting: Family Medicine

## 2011-10-07 ENCOUNTER — Ambulatory Visit (INDEPENDENT_AMBULATORY_CARE_PROVIDER_SITE_OTHER): Payer: 59 | Admitting: Family Medicine

## 2011-10-07 ENCOUNTER — Telehealth: Payer: Self-pay | Admitting: Family Medicine

## 2011-10-07 VITALS — BP 122/82 | HR 64 | Wt 171.0 lb

## 2011-10-07 DIAGNOSIS — F32A Depression, unspecified: Secondary | ICD-10-CM

## 2011-10-07 DIAGNOSIS — F419 Anxiety disorder, unspecified: Secondary | ICD-10-CM

## 2011-10-07 DIAGNOSIS — F341 Dysthymic disorder: Secondary | ICD-10-CM

## 2011-10-07 MED ORDER — ZOLPIDEM TARTRATE 10 MG PO TABS: 10.0000 mg | ORAL_TABLET | Freq: Every evening | ORAL | Status: DC | PRN

## 2011-10-07 NOTE — Telephone Encounter (Signed)
Called Ambien 10 mg in to [facility] pharmacy for pt.

## 2011-10-07 NOTE — Patient Instructions (Signed)
Take the Wellbutrin in the morning both doses. I will give you Ambien to help with sleep . Take it for several days and then as needed after that. Turn here in a month.
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
## 2011-10-28 ENCOUNTER — Telehealth: Payer: Self-pay | Admitting: Family Medicine

## 2011-10-28 NOTE — Telephone Encounter (Signed)
Pt called and cancelled 2nd follow up appt.  She said meds were doing fine and doesn't think she needs to come in again.  Please advise pt if she does.

## 2011-10-28 NOTE — Telephone Encounter (Signed)
I will see her as needed

## 2011-11-11 ENCOUNTER — Ambulatory Visit (INDEPENDENT_AMBULATORY_CARE_PROVIDER_SITE_OTHER): Payer: 59 | Admitting: Family Medicine

## 2011-11-11 ENCOUNTER — Encounter: Payer: Self-pay | Admitting: Family Medicine

## 2011-11-11 ENCOUNTER — Inpatient Hospital Stay: Payer: 59 | Admitting: Family Medicine

## 2011-11-11 DIAGNOSIS — R079 Chest pain, unspecified: Secondary | ICD-10-CM

## 2011-11-11 NOTE — Patient Instructions (Signed)
Tylenol for the discomfort. If the symptoms worsened, go to the emergency room

## 2011-11-11 NOTE — Progress Notes (Signed)
  Subjective:    Patient ID: [name] [name], female    DOB: 01/08/1966, 45 y.o.   MRN: [id number]  HPI She has a one hour history of left-sided pressure sensation with a pressure sensation in the shoulder. No shortness of breath, sweating or weakness. Deep breathing actually makes the shoulder discomfort increase.   Review of Systems     Objective:   Physical Exam Alert and in no distress. No chest wall tenderness. Skin is warm and dry. Cardiac exam shows regular rhythm without murmurs or gallops. Lungs are clear to auscultation. EKG shows no acute changes      Assessment & Plan:  Probable musculoskeletal chest pain Commend, for her pain however if her symptoms worsen, I did recommend she be evaluated in the emergency room.

## 2011-11-26 ENCOUNTER — Telehealth: Payer: Self-pay | Admitting: Family Medicine

## 2011-11-26 MED ORDER — LISDEXAMFETAMINE DIMESYLATE 30 MG PO CAPS: 30.0000 mg | ORAL_CAPSULE | ORAL | Status: DC

## 2011-11-26 NOTE — Telephone Encounter (Signed)
TSD  

## 2011-11-26 NOTE — Telephone Encounter (Signed)
Vyvanse renewed 

## 2011-12-02 ENCOUNTER — Telehealth: Payer: Self-pay | Admitting: Internal Medicine

## 2011-12-02 MED ORDER — BUPROPION HCL ER (SR) 150 MG PO TB12: 150.0000 mg | ORAL_TABLET | Freq: Two times a day (BID) | ORAL | Status: DC

## 2011-12-02 NOTE — Telephone Encounter (Signed)
Wellbutrin renewed.

## 2011-12-17 ENCOUNTER — Other Ambulatory Visit: Payer: Self-pay

## 2011-12-17 ENCOUNTER — Other Ambulatory Visit: Payer: Self-pay | Admitting: Family Medicine

## 2011-12-17 MED ORDER — ZOLPIDEM TARTRATE 10 MG PO TABS: 10.0000 mg | ORAL_TABLET | Freq: Every evening | ORAL | Status: DC | PRN

## 2011-12-17 NOTE — Telephone Encounter (Signed)
Refill Ambien but no refill

## 2011-12-17 NOTE — Telephone Encounter (Signed)
Is this ok?

## 2011-12-17 NOTE — Telephone Encounter (Signed)
Called med in 

## 2012-02-13 ENCOUNTER — Other Ambulatory Visit: Payer: Self-pay | Admitting: Family Medicine

## 2012-02-13 NOTE — Telephone Encounter (Signed)
IS THIS OK 

## 2012-02-17 ENCOUNTER — Telehealth: Payer: Self-pay | Admitting: Family Medicine

## 2012-02-17 NOTE — Telephone Encounter (Signed)
Renew the Ambien 

## 2012-02-18 MED ORDER — ZOLPIDEM TARTRATE 10 MG PO TABS: 10.0000 mg | ORAL_TABLET | Freq: Every evening | ORAL | Status: DC | PRN

## 2012-02-18 NOTE — Telephone Encounter (Signed)
Called in med 

## 2012-02-23 ENCOUNTER — Telehealth: Payer: Self-pay | Admitting: Family Medicine

## 2012-02-23 NOTE — Telephone Encounter (Signed)
She thinks she is having hot flash symptoms. She is on hormone replacement. I recommended that she check with pharmacy to see if it's better to take on a full or empty stomach. If no improvement, she'll make an appointment.

## 2012-02-24 ENCOUNTER — Telehealth: Payer: Self-pay | Admitting: Family Medicine

## 2012-02-24 MED ORDER — LISDEXAMFETAMINE DIMESYLATE 30 MG PO CAPS: 30.0000 mg | ORAL_CAPSULE | ORAL | Status: DC

## 2012-02-24 NOTE — Telephone Encounter (Signed)
Vyvanse will be renewed

## 2012-03-04 ENCOUNTER — Telehealth: Payer: Self-pay | Admitting: Family Medicine

## 2012-03-04 NOTE — Telephone Encounter (Signed)
DONE

## 2012-03-22 ENCOUNTER — Telehealth: Payer: Self-pay | Admitting: Internal Medicine

## 2012-03-22 MED ORDER — EST ESTROGENS-METHYLTEST 0.625-1.25 MG PO TABS: 1.0000 | ORAL_TABLET | Freq: Every day | ORAL | Status: DC

## 2012-03-22 NOTE — Telephone Encounter (Signed)
HRT renewed

## 2012-03-23 ENCOUNTER — Telehealth: Payer: Self-pay | Admitting: Internal Medicine

## 2012-03-23 MED ORDER — EST ESTROGENS-METHYLTEST 0.625-1.25 MG PO TABS: 1.0000 | ORAL_TABLET | Freq: Every day | ORAL | Status: DC

## 2012-03-23 NOTE — Telephone Encounter (Signed)
Dr. Susann Givens sent the medicine to [facility] instead of [facility]. Called it in to [place] [facility].

## 2012-03-30 ENCOUNTER — Encounter: Payer: Self-pay | Admitting: Medical

## 2012-03-30 ENCOUNTER — Ambulatory Visit (INDEPENDENT_AMBULATORY_CARE_PROVIDER_SITE_OTHER): Payer: 59 | Admitting: Medical

## 2012-03-30 VITALS — BP 130/80 | HR 76 | Temp 98.5°F | Resp 16 | Wt 166.0 lb

## 2012-03-30 DIAGNOSIS — W57XXXA Bitten or stung by nonvenomous insect and other nonvenomous arthropods, initial encounter: Secondary | ICD-10-CM

## 2012-03-30 DIAGNOSIS — N898 Other specified noninflammatory disorders of vagina: Secondary | ICD-10-CM

## 2012-03-30 DIAGNOSIS — N899 Noninflammatory disorder of vagina, unspecified: Secondary | ICD-10-CM

## 2012-03-30 DIAGNOSIS — T148XXA Other injury of unspecified body region, initial encounter: Secondary | ICD-10-CM

## 2012-03-30 DIAGNOSIS — IMO0002 Reserved for concepts with insufficient information to code with codable children: Secondary | ICD-10-CM

## 2012-03-30 DIAGNOSIS — S30860A Insect bite (nonvenomous) of lower back and pelvis, initial encounter: Secondary | ICD-10-CM

## 2012-03-30 MED ORDER — MUPIROCIN 2 % EX OINT: TOPICAL_OINTMENT | Freq: Three times a day (TID) | CUTANEOUS | Status: DC

## 2012-03-30 MED ORDER — LIDOCAINE HCL 2 % EX GEL: CUTANEOUS | Status: DC | PRN

## 2012-03-30 MED ORDER — MUPIROCIN 2 % EX OINT: TOPICAL_OINTMENT | Freq: Three times a day (TID) | CUTANEOUS | Status: AC

## 2012-03-30 MED ORDER — LIDOCAINE HCL 2 % EX GEL
CUTANEOUS | Status: DC | PRN
Start: 2012-03-30 — End: 2012-03-30

## 2012-03-30 MED ORDER — DOXYCYCLINE HYCLATE 100 MG PO TABS: 100.0000 mg | ORAL_TABLET | Freq: Two times a day (BID) | ORAL | Status: DC

## 2012-03-30 NOTE — Progress Notes (Signed)
Subjective Here for possible tick in vaginal region.  She was outside yesterday doing yard work, over night felt something painful in vaginal region. Had husband look this morning, though he saw a tiny tick.  She notes discomfort, itching and irritation all day.  Husband does yard work and has ticks on him often.  She denies vaginal discharge, odor or any concern for STD or yeast infection.  No other c/o. No other aggravating or relieving factors.   No past medical history on file.  ROS negative  Objective: Gen: WD, WN, NAD Gyn: inferior left lateral vulva just slightly to left of labia with small 2mm papular erythematous lesion suggestive of insect bite. No obvious tick or insect.  Speculum exam with slight white discharge, no other lesions or insects, wet prep swab taken, exam chaperoned by nurse  Wet prep with some bacteria, epithelial, no yeasts, clue cells or trich.  Negative.  Assessment: Encounter Diagnoses  Name Primary?  . Insect bite of genital area Yes  . Vaginal irritation    Plan: Will treat as insect bite.  Advised warm soaks, can use topical lidocaine jelly, begin Mupirocin ointment and if it starts to get red, swollen and infected, then begin Doxycycline.  Otherwise call/return if not improving or worse.  Can use Ibuprofen OTC for pain.

## 2012-03-30 NOTE — Patient Instructions (Signed)
   Warm bath soaks with salt water  Use the antibiotic ointment Mupirocin 3 times daily  You can use lidocaine jelly topically for pain  Ibuprofen OTC 200mg , 3-4 tablets 3 times daily for pain  If redness, area looking bigger or infected, then begin Doxycycline antibiotic.

## 2012-04-05 ENCOUNTER — Other Ambulatory Visit: Payer: Self-pay | Admitting: Family Medicine

## 2012-04-05 DIAGNOSIS — Z1231 Encounter for screening mammogram for malignant neoplasm of breast: Secondary | ICD-10-CM

## 2012-04-07 ENCOUNTER — Ambulatory Visit (INDEPENDENT_AMBULATORY_CARE_PROVIDER_SITE_OTHER): Payer: 59 | Admitting: Family Medicine

## 2012-04-07 ENCOUNTER — Encounter: Payer: Self-pay | Admitting: Family Medicine

## 2012-04-07 VITALS — BP 114/70 | HR 78 | Wt 166.0 lb

## 2012-04-07 DIAGNOSIS — Z90710 Acquired absence of both cervix and uterus: Secondary | ICD-10-CM

## 2012-04-07 MED ORDER — ESTROGENS CONJUGATED 0.625 MG PO TABS: 0.6250 mg | ORAL_TABLET | Freq: Every day | ORAL | Status: DC

## 2012-04-07 NOTE — Progress Notes (Signed)
  Subjective:    Patient ID: [name] [name], female    DOB: [birth year], 46 y.o.   MRN: [id number]  HPI She is here for consultation. Apparently Estratest is no longer being made. She is interested in stopping hormone replacement. She had a total hysterectomy several years ago and has been on medication since then. She does have concerns over libido.   Review of Systems     Objective:   Physical Exam Alert and in no distress otherwise not examined       Assessment & Plan:   1. H/O hysterectomy for benign disease  estrogens, conjugated, (PREMARIN) 0.625 MG tablet   I discussed options with her. I will place her on Premarin. Discussed keeping her on this until roughly around age 24. She is comfortable with this. She will let me know if she notes any libido issues the

## 2012-04-07 NOTE — Patient Instructions (Signed)
Let me know if you notice this there is any change in your libido

## 2012-04-14 ENCOUNTER — Telehealth: Payer: Self-pay | Admitting: Internal Medicine

## 2012-04-14 MED ORDER — ZOLPIDEM TARTRATE 10 MG PO TABS: 10.0000 mg | ORAL_TABLET | Freq: Every evening | ORAL | Status: DC | PRN

## 2012-04-14 NOTE — Telephone Encounter (Signed)
Renew Ambien.

## 2012-04-14 NOTE — Telephone Encounter (Signed)
Med called in per jcl 

## 2012-05-17 ENCOUNTER — Ambulatory Visit (HOSPITAL_COMMUNITY): Payer: 59

## 2012-05-18 ENCOUNTER — Ambulatory Visit (HOSPITAL_COMMUNITY)
Admission: RE | Admit: 2012-05-18 | Discharge: 2012-05-18 | Disposition: A | Discharge status: Home / Self Care | Payer: 59 | Source: Ambulatory Visit | Attending: Family Medicine | Admitting: Family Medicine

## 2012-05-18 DIAGNOSIS — Z1231 Encounter for screening mammogram for malignant neoplasm of breast: Secondary | ICD-10-CM | POA: Insufficient documentation

## 2012-06-01 ENCOUNTER — Other Ambulatory Visit: Payer: Self-pay | Admitting: Family Medicine

## 2012-06-01 NOTE — Telephone Encounter (Signed)
Wellbutrin renewed.

## 2012-06-01 NOTE — Telephone Encounter (Signed)
Is this okay?

## 2012-06-02 ENCOUNTER — Telehealth: Payer: Self-pay | Admitting: Family Medicine

## 2012-06-02 MED ORDER — LISDEXAMFETAMINE DIMESYLATE 30 MG PO CAPS: 30.0000 mg | ORAL_CAPSULE | ORAL | Status: DC

## 2012-06-02 NOTE — Telephone Encounter (Signed)
Vyvanse renewed 

## 2012-06-02 NOTE — Telephone Encounter (Signed)
LM

## 2012-06-14 ENCOUNTER — Telehealth: Payer: Self-pay | Admitting: Family Medicine

## 2012-06-14 NOTE — Telephone Encounter (Signed)
Called pt left message for her to make appt to dicuss the med problem

## 2012-06-14 NOTE — Telephone Encounter (Signed)
Have her set up an appointment to discuss this.

## 2012-07-16 ENCOUNTER — Ambulatory Visit (INDEPENDENT_AMBULATORY_CARE_PROVIDER_SITE_OTHER): Payer: 59 | Admitting: Medical

## 2012-07-16 ENCOUNTER — Encounter: Payer: Self-pay | Admitting: Medical

## 2012-07-16 VITALS — BP 130/80 | HR 60 | Temp 98.2°F | Resp 16 | Wt 163.0 lb

## 2012-07-16 DIAGNOSIS — K3189 Other diseases of stomach and duodenum: Secondary | ICD-10-CM

## 2012-07-16 DIAGNOSIS — K2970 Gastritis, unspecified, without bleeding: Secondary | ICD-10-CM

## 2012-07-16 DIAGNOSIS — R1013 Epigastric pain: Secondary | ICD-10-CM

## 2012-07-16 DIAGNOSIS — K30 Functional dyspepsia: Secondary | ICD-10-CM

## 2012-07-16 DIAGNOSIS — R110 Nausea: Secondary | ICD-10-CM

## 2012-07-16 DIAGNOSIS — K2990 Gastroduodenitis, unspecified, without bleeding: Secondary | ICD-10-CM

## 2012-07-16 LAB — POCT URINALYSIS DIPSTICK
Bilirubin, UA: NEGATIVE
Glucose, UA: NEGATIVE
Ketones, UA: NEGATIVE
Leukocytes, UA: NEGATIVE
Nitrite, UA: NEGATIVE
Protein, UA: NEGATIVE
Spec Grav, UA: 1.02
Urobilinogen, UA: NEGATIVE
pH, UA: 5

## 2012-07-16 MED ORDER — PROMETHAZINE HCL 25 MG PO TABS: 25.0000 mg | ORAL_TABLET | Freq: Three times a day (TID) | ORAL | Status: DC | PRN

## 2012-07-16 MED ORDER — ESOMEPRAZOLE MAGNESIUM 40 MG PO CPDR: 40.0000 mg | DELAYED_RELEASE_CAPSULE | Freq: Every day | ORAL | Status: DC

## 2012-07-16 NOTE — Progress Notes (Signed)
Subjective:   HPI  [name] [name] is a 46 y.o. female who presents with c/o nausea and upset stomach.  Been feeling not quite right the past week or more with nausea, but worse nausea and upset stomach this week.  She notes nausea like when she had morning sickness in the past, feels very tired, pain right lower pelvic region.  Does have hx/o interstitial cystis though.  S/p complete hysterectomy.  Denies vomiting or diarrhea.   She thinks she had fever week before.  No other aggravating or relieving factors.    No other c/o.  The following portions of the patient's history were reviewed and updated as appropriate: allergies, current medications, past family history, past medical history, past social history, past surgical history and problem list.  No past medical history on file.  No Known Allergies  Review of Systems Constitutional: -fever, -chills, -sweats, -unexpected -weight change, +fatigue ENT: -runny nose, -ear pain, -sore throat Cardiology:  -chest pain, -palpitations, -edema Respiratory: -cough, -shortness of breath, -wheezing Gastroenterology: +generalized abdominal discomfort, +nausea, -vomiting, -diarrhea, -constipation  Hematology: -bleeding or bruising problems Musculoskeletal: -arthralgias, -myalgias, -joint swelling, -back pain Urology: +urinary frequency, +urgency Neurology: -headache, -weakness, -tingling, -numbness    Objective:   Physical Exam  General appearance: alert, no distress, WD/WN HEENT: normocephalic, sclerae anicteric, TMs pearly, nares patent, no discharge or erythema, pharynx normal Oral cavity: MMM, no lesions Neck: supple, no lymphadenopathy, no thyromegaly, no masses Heart: RRR, normal S1, S2, no murmurs Lungs: CTA bilaterally, no wheezes, rhonchi, or rales Abdomen: +bs, soft, mild generalized tenderness, non distended, no masses, no hepatomegaly, no splenomegaly Pulses: 2+ symmetric, upper and lower extremities, normal cap  refill   Assessment and Plan :     Encounter Diagnoses  Name Primary?  . Nausea Yes  . Upset stomach   . Gastritis    Urinalysis unremarkable today.  Most likely diagnosis is gastritis.  Advised she hydrate with water, begin samples of Nexium, can use Tums prn, and script for Phenergan sent just in case.   If not improving in 3-5 days, call or return.

## 2012-07-29 ENCOUNTER — Encounter: Payer: Self-pay | Admitting: Internal Medicine

## 2012-08-04 ENCOUNTER — Encounter: Payer: Self-pay | Admitting: Family Medicine

## 2012-08-04 ENCOUNTER — Ambulatory Visit (INDEPENDENT_AMBULATORY_CARE_PROVIDER_SITE_OTHER): Payer: 59 | Admitting: Family Medicine

## 2012-08-04 VITALS — BP 118/76 | HR 66 | Ht 64.5 in | Wt 162.0 lb

## 2012-08-04 DIAGNOSIS — F341 Dysthymic disorder: Secondary | ICD-10-CM

## 2012-08-04 DIAGNOSIS — Z0000 Encounter for general adult medical examination without abnormal findings: Secondary | ICD-10-CM

## 2012-08-04 DIAGNOSIS — G479 Sleep disorder, unspecified: Secondary | ICD-10-CM

## 2012-08-04 DIAGNOSIS — F988 Other specified behavioral and emotional disorders with onset usually occurring in childhood and adolescence: Secondary | ICD-10-CM

## 2012-08-04 DIAGNOSIS — N3010 Interstitial cystitis (chronic) without hematuria: Secondary | ICD-10-CM

## 2012-08-04 LAB — POCT URINALYSIS DIPSTICK
Bilirubin, UA: NEGATIVE
Blood, UA: NEGATIVE
Glucose, UA: NEGATIVE
Ketones, UA: NEGATIVE
Leukocytes, UA: NEGATIVE
Nitrite, UA: NEGATIVE
Protein, UA: NEGATIVE
Spec Grav, UA: 1.025
Urobilinogen, UA: NEGATIVE
pH, UA: 5

## 2012-08-04 LAB — HEMOCCULT GUIAC POC 1CARD (OFFICE)

## 2012-08-04 NOTE — Progress Notes (Signed)
Subjective:    Patient ID: [name] [name], female    DOB: 1966-04-30, 46 y.o.   MRN: [id number]  HPI She is here for complete examination. She continues on Wellbutrin and is doing quite nicely on that. She also has ADD and is using Vyvanse with good results. She has a long history of sleep disturbance dating back 20 years or more. She can fall sleep easily but has trouble staying asleep. In the past she has been using 2 Benadryl as well as Ambien to help with this. She states that she is using the Benadryl to help dry her out. She does wake up in the middle night and does urinate. She does have a history of interstitial cystitis but says this is usually triggered by certain foods. He has had some difficulty with right lower quadrant pain has been intermittent in nature but she cannot relate this to food, urinating, bowel movements. She has a one-month history of bilateral knee pain with clicking sensation. She's had previous surgery on her knees for meniscal damage. She's had no locking or grinding or effusion. Her home life is unchanged. Work is going well. Family and social history is unchanged.   Review of Systems Negative except as above    Objective:   Physical Exam BP 118/76  Pulse 66  Ht 5' 4.5" (1.638 m)  Wt 162 lb (73.483 kg)  BMI 27.38 kg/m2  SpO2 98%  General Appearance:    Alert, cooperative, no distress, appears stated age  Head:    Normocephalic, without obvious abnormality, atraumatic  Eyes:    PERRL, conjunctiva/corneas clear, EOM's intact, fundi    benign  Ears:    Normal TM's and external ear canals  Nose:   Nares normal, mucosa normal, no drainage or sinus   tenderness  Throat:   Lips, mucosa, and tongue normal; teeth and gums normal  Neck:   Supple, no lymphadenopathy;  thyroid:  no   enlargement/tenderness/nodules; no carotid   bruit or JVD  Back:    Spine nontender, no curvature, ROM normal, no CVA     tenderness  Lungs:     Clear to auscultation bilaterally  without wheezes, rales or     ronchi; respirations unlabored  Chest Wall:    No tenderness or deformity   Heart:    Regular rate and rhythm, S1 and S2 normal, no murmur, rub   or gallop  Breast Exam:    No tenderness, masses, or nipple discharge or inversion.      No axillary lymphadenopathy  Abdomen:     Soft, non-tender, nondistended, normoactive bowel sounds,    no masses, no hepatosplenomegaly  Genitalia:    Normal external genitalia without lesions.  BUS and vagina normal; cervix without lesions, or cervical motion tenderness. No abnormal vaginal discharge.  Uterus and adnexa not enlarged, nontender, no masses.  Pap performed  Rectal:    Normal tone, no masses or tenderness; guaiac negative stool  Extremities:   No clubbing, cyanosis or edema.lateral knee exam shows no effusion. McMurray's testing negative. Anterior drawer negative. Exam of the patella shows no pain.   Pulses:   2+ and symmetric all extremities  Skin:   Skin color, texture, turgor normal, no rashes or lesions  Lymph nodes:   Cervical, supraclavicular, and axillary nodes normal  Neurologic:   CNII-XII intact, normal strength, sensation and gait; reflexes 2+ and symmetric throughout          Psych:   Normal mood, affect, hygiene and  grooming.          Assessment & Plan:   1. Routine general medical examination at a health care facility  POCT Urinalysis Dipstick, Hemoccult - 1 Card (office)  2. Dysthymia    3. ADD (attention deficit disorder)    4. Sleep disturbance    5. IC (interstitial cystitis)    6  lateral knee pain recommend terminal extension exercises. We'll also continue on her ADD medicine as well as Wellbutrin. She will continue to monitor her food intake in regard to her interstitial cystitis. Return here if continued difficulty with knees. I also discussed sleep disturbance with her in detail. Information was given. Strongly encouraged her to stop the Benadryl. Recommend she use Ambien as needed rather  than regularly. There is a real question is whether it is doing any good anyway.

## 2012-08-04 NOTE — Patient Instructions (Addendum)
Insomnia Insomnia is frequent trouble falling and/or staying asleep. Insomnia can be a long term problem or a short term problem. Both are common. Insomnia can be a short term problem when the wakefulness is related to a certain stress or worry. Long term insomnia is often related to ongoing stress during waking hours and/or poor sleeping habits. Overtime, sleep deprivation itself can make the problem worse. Every little thing feels more severe because you are overtired and your ability to cope is decreased. CAUSES   Stress, anxiety, and depression.   Poor sleeping habits.   Distractions such as TV in the bedroom.   Naps close to bedtime.   Engaging in emotionally charged conversations before bed.   Technical reading before sleep.   Alcohol and other sedatives. They may make the problem worse. They can hurt normal sleep patterns and normal dream activity.   Stimulants such as caffeine for several hours prior to bedtime.   Pain syndromes and shortness of breath can cause insomnia.   Exercise late at night.   Changing time zones may cause sleeping problems (jet lag).  It is sometimes helpful to have someone observe your sleeping patterns. They should look for periods of not breathing during the night (sleep apnea). They should also look to see how long those periods last. If you live alone or observers are uncertain, you can also be observed at a sleep clinic where your sleep patterns will be professionally monitored. Sleep apnea requires a checkup and treatment. Give your caregivers your medical history. Give your caregivers observations your family has made about your sleep.  SYMPTOMS   Not feeling rested in the morning.   Anxiety and restlessness at bedtime.   Difficulty falling and staying asleep.  TREATMENT   Your caregiver may prescribe treatment for an underlying medical disorders. Your caregiver can give advice or help if you are using alcohol or other drugs for  self-medication. Treatment of underlying problems will usually eliminate insomnia problems.   Medications can be prescribed for short time use. They are generally not recommended for lengthy use.   Over-the-counter sleep medicines are not recommended for lengthy use. They can be habit forming.   You can promote easier sleeping by making lifestyle changes such as:   Using relaxation techniques that help with breathing and reduce muscle tension.   Exercising earlier in the day.   Changing your diet and the time of your last meal. No night time snacks.   Establish a regular time to go to bed.   Counseling can help with stressful problems and worry.   Soothing music and white noise may be helpful if there are background noises you cannot remove.   Stop tedious detailed work at least one hour before bedtime.  HOME CARE INSTRUCTIONS   Keep a diary. Inform your caregiver about your progress. This includes any medication side effects. See your caregiver regularly. Take note of:   Times when you are asleep.   Times when you are awake during the night.   The quality of your sleep.   How you feel the next day.  This information will help your caregiver care for you.  Get out of bed if you are still awake after 15 minutes. Read or do some quiet activity. Keep the lights down. Wait until you feel sleepy and go back to bed.   Keep regular sleeping and waking hours. Avoid naps.   Exercise regularly.   Avoid distractions at bedtime. Distractions include watching television or engaging   in any intense or detailed activity like attempting to balance the household checkbook.   Develop a bedtime ritual. Keep a familiar routine of bathing, brushing your teeth, climbing into bed at the same time each night, listening to soothing music. Routines increase the success of falling to sleep faster.   Use relaxation techniques. This can be using breathing and muscle tension release routines. It can  also include visualizing peaceful scenes. You can also help control troubling or intruding thoughts by keeping your mind occupied with boring or repetitive thoughts like the old concept of counting sheep. You can make it more creative like imagining planting one beautiful flower after another in your backyard garden.   During your day, work to eliminate stress. When this is not possible use some of the previous suggestions to help reduce the anxiety that accompanies stressful situations.  MAKE SURE YOU:   Understand these instructions.   Will watch your condition.   Will get help right away if you are not doing well or get worse.  Document Released: 11/28/2000 Document Revised: 11/20/2011 Document Reviewed: 12/29/2007 [facility] Patient Information 2012 [place], [place]. To the terminal extension exercises for your niece and see if that'll help with the pain. If you have continued difficulty with that, let me know. Also let me know how you're doing with your sleeping

## 2012-08-18 ENCOUNTER — Telehealth: Payer: Self-pay | Admitting: Internal Medicine

## 2012-08-18 MED ORDER — ZOLPIDEM TARTRATE 10 MG PO TABS: 10.0000 mg | ORAL_TABLET | Freq: Every evening | ORAL | Status: DC | PRN

## 2012-08-18 NOTE — Telephone Encounter (Signed)
Pt states that she can not sleep well without med at night and would like to go back on ambien 10mg . [facility]. Please contact pt and leave a message if she doesn't answer to know if he will refill that med or not

## 2012-08-18 NOTE — Telephone Encounter (Signed)
Call in a 30 day supply of Ambien with one refill. Reinforce with her the fact that she should use the Ambien as needed rather than regularly . Tell her to wait at least a half an hour after going to bed to see if she truly needs it before taking the pill

## 2012-08-18 NOTE — Telephone Encounter (Signed)
CALLED MED IN AND CALLED PT CELL # AND LEFT MESSAGE WORD FOR WORD .

## 2012-08-23 ENCOUNTER — Other Ambulatory Visit: Payer: Self-pay | Admitting: Family Medicine

## 2012-08-23 NOTE — Telephone Encounter (Signed)
Pt req refill for Estrates HS to [facility].

## 2012-09-08 ENCOUNTER — Telehealth: Payer: Self-pay | Admitting: Family Medicine

## 2012-09-08 ENCOUNTER — Other Ambulatory Visit: Payer: Self-pay | Admitting: Medical

## 2012-09-08 MED ORDER — LISDEXAMFETAMINE DIMESYLATE 30 MG PO CAPS: 30.0000 mg | ORAL_CAPSULE | ORAL | Status: DC

## 2012-09-21 ENCOUNTER — Other Ambulatory Visit: Payer: Self-pay | Admitting: Family Medicine

## 2012-09-21 NOTE — Telephone Encounter (Signed)
Is this ok?

## 2012-09-22 NOTE — Telephone Encounter (Signed)
DONE

## 2012-09-24 ENCOUNTER — Other Ambulatory Visit: Payer: Self-pay | Admitting: Family Medicine

## 2012-11-24 ENCOUNTER — Telehealth: Payer: Self-pay | Admitting: Internal Medicine

## 2012-11-25 MED ORDER — LISDEXAMFETAMINE DIMESYLATE 30 MG PO CAPS: 30.0000 mg | ORAL_CAPSULE | ORAL | Status: DC

## 2012-11-25 NOTE — Telephone Encounter (Signed)
Vyvanse renewed 

## 2012-11-29 ENCOUNTER — Telehealth: Payer: Self-pay | Admitting: Family Medicine

## 2012-11-29 ENCOUNTER — Other Ambulatory Visit: Payer: Self-pay | Admitting: Family Medicine

## 2012-11-29 MED ORDER — BUPROPION HCL ER (SR) 150 MG PO TB12: 150.0000 mg | ORAL_TABLET | Freq: Two times a day (BID) | ORAL | Status: DC

## 2012-11-29 NOTE — Telephone Encounter (Signed)
Sent in

## 2012-12-15 DIAGNOSIS — M5412 Radiculopathy, cervical region: Secondary | ICD-10-CM

## 2012-12-15 HISTORY — DX: Radiculopathy, cervical region: M54.12

## 2012-12-20 ENCOUNTER — Other Ambulatory Visit: Payer: Self-pay | Admitting: Family Medicine

## 2012-12-20 ENCOUNTER — Other Ambulatory Visit: Payer: Self-pay

## 2012-12-20 NOTE — Telephone Encounter (Signed)
CALLED IN AMBIEN PER JCL 

## 2012-12-20 NOTE — Telephone Encounter (Signed)
Okay to renew

## 2012-12-20 NOTE — Telephone Encounter (Signed)
IS THIS OK 

## 2013-01-09 ENCOUNTER — Other Ambulatory Visit: Payer: Self-pay | Admitting: Family Medicine

## 2013-01-09 MED ORDER — OSELTAMIVIR PHOSPHATE 75 MG PO CAPS: 75.0000 mg | ORAL_CAPSULE | Freq: Two times a day (BID) | ORAL | Status: DC

## 2013-01-09 NOTE — Progress Notes (Signed)
Tamiflu called in. Her daughter was diagnosed and now she has similar symptoms. Recommend she take anti-inflammatory of choice to help with her symptoms as well.

## 2013-01-25 ENCOUNTER — Ambulatory Visit (INDEPENDENT_AMBULATORY_CARE_PROVIDER_SITE_OTHER): Payer: 59 | Admitting: Family Medicine

## 2013-01-25 ENCOUNTER — Encounter: Payer: Self-pay | Admitting: Family Medicine

## 2013-01-25 VITALS — BP 120/80 | HR 80 | Ht 64.0 in | Wt 165.0 lb

## 2013-01-25 DIAGNOSIS — G479 Sleep disorder, unspecified: Secondary | ICD-10-CM

## 2013-01-25 DIAGNOSIS — M25561 Pain in right knee: Secondary | ICD-10-CM

## 2013-01-25 DIAGNOSIS — M25569 Pain in unspecified knee: Secondary | ICD-10-CM

## 2013-01-25 DIAGNOSIS — F988 Other specified behavioral and emotional disorders with onset usually occurring in childhood and adolescence: Secondary | ICD-10-CM

## 2013-01-25 DIAGNOSIS — F341 Dysthymic disorder: Secondary | ICD-10-CM

## 2013-01-25 DIAGNOSIS — N3010 Interstitial cystitis (chronic) without hematuria: Secondary | ICD-10-CM

## 2013-01-25 NOTE — Progress Notes (Signed)
  Subjective:    Patient ID: [name] [name], female    DOB: 10/10/1966, 47 y.o.   MRN: [id number]  HPI She is here to discuss multiple issues. She would like to be switched to plain Adderall as opposed to Vyvanse help cut down on her cost. She also continues on Wellbutrin and has been on this for quite some time to help with her underlying dysthymia. She does have underlying interstitial cystitis and finds that Benadryl at night helps with the nocturia. She also continues on 5 mg of Ambien to help with her sleep disturbance. She has had difficulty with this for over a year. She blames normal daily stresses for the need for the sleep medication. She states she has tried different sleep hygiene techniques without much success. She also complainsof bilateral knee pain however right greater than left. The pain is pretty constant but does get worse with physical activities. She has had previous knee surgery on both knees. She complains of a cracking sensation but no popping locking grinding or effusion.   Review of Systems     Objective:   Physical Exam Alert and in no distress. Exam of the right knee shows no effusion. Compression test negative. McMurray's test medially did cause discomfort. Anterior drawer negative. No effusion noted.       Assessment & Plan:   1. Dysthymia   2. ADD (attention deficit disorder)   3. Sleep disturbance   4. IC (interstitial cystitis)   5. Knee pain, acute, right    I gave her a discount card for Vyvanse for the next 6 months. We will readdress this at that time. Explained that switching from Vyvanse and Adderall can potentially take some time and effort. Also discussed sleep hygiene with her. I will refer her to a sleep specialist to help with this. Again discussed the fact that sleep is a learned behavior and she has learned to not sleep. Also discussed the use of Benadryl to help with her IC symptoms. Recommend terminal extension exercises for her knees to help  strengthen them. If continued difficulty with pain she is to return to see me. Sleep hygiene information given to patient again.

## 2013-01-25 NOTE — Patient Instructions (Signed)

## 2013-02-01 ENCOUNTER — Telehealth: Payer: Self-pay | Admitting: Family Medicine

## 2013-02-01 NOTE — Telephone Encounter (Signed)
Pt called she is not due to get another refill on Vyvanse until 03/08/13 and her Vyvanse card you gave her must be activated and used by Feb 28th and is only good through June.  Do you have a different card or can she switch to Adderall as the Vyvanse copay went from 4.00 to 25.00.  Please leave message on pt voice mail [phone]

## 2013-02-01 NOTE — Telephone Encounter (Signed)
See if you can help her with this. 

## 2013-02-01 NOTE — Telephone Encounter (Signed)
Dr.lalonde all the cards we have for vyvanse are suppose to be used the first time by Feb 11 2013 so please advise

## 2013-02-01 NOTE — Telephone Encounter (Signed)
It doesn't look like we can help with that card. Check with the company and see if they have any new cards coming out

## 2013-02-01 NOTE — Telephone Encounter (Signed)
Dr.lalonde all Vyvanse cards we have all have to used the first time by 02/11/13 please advise what you want to do

## 2013-02-03 NOTE — Telephone Encounter (Signed)
Dr.lalonde looked into company they have a new promo. But its more than what her copay is going to be please advise

## 2013-02-03 NOTE — Telephone Encounter (Signed)
Called pt back to let her know that the new card coming out is $ 30 copay  Does she want to change to shorter acting just call me back and let me know

## 2013-02-04 ENCOUNTER — Telehealth: Payer: Self-pay

## 2013-02-04 NOTE — Telephone Encounter (Signed)
PT CALLED AND SAID THAT SHE THOUGHT THAT THERE WAS XR ADDERALL THAT YOU TAKE 2 TIMES A DAY AND THAT SHE THOUGHT THAT YOU AND HER TALKED ABOUT IT SHE SAID SHE HAD 1 MORE MONTH LEFT NOT TO BE IN A RUSH  PLEASE ADVISE

## 2013-02-28 ENCOUNTER — Telehealth: Payer: Self-pay

## 2013-02-28 MED ORDER — AMPHETAMINE-DEXTROAMPHET ER 15 MG PO CP24: 15.0000 mg | ORAL_CAPSULE | ORAL | Status: DC

## 2013-02-28 MED ORDER — BUPROPION HCL ER (SR) 150 MG PO TB12: 150.0000 mg | ORAL_TABLET | Freq: Two times a day (BID) | ORAL | Status: DC

## 2013-02-28 NOTE — Telephone Encounter (Signed)
She would like to be switched Adderall XR because of costs. Usually the dosing is half of the Vyvanse dosing. This will be explained to her.

## 2013-02-28 NOTE — Telephone Encounter (Signed)
Pt called and stated she needs refill Wellbutrin and would like adderal xr  this time please advise

## 2013-02-28 NOTE — Telephone Encounter (Signed)
Call and let her noted to pick up the prescription for the Adderall. Explained to her that it is usually half the dosing of her Vyvanse. Have her let us know how well this works

## 2013-03-01 ENCOUNTER — Other Ambulatory Visit: Payer: Self-pay

## 2013-03-01 NOTE — Telephone Encounter (Signed)
Called and left message for pt that Rx is ready to pick up Wellbutrin was called in and adder all is usually 1/2 the dose of vayvanse and to call us back and let us know how it works

## 2013-03-02 ENCOUNTER — Telehealth: Payer: Self-pay | Admitting: Family Medicine

## 2013-03-02 NOTE — Telephone Encounter (Signed)
Called and requested prior auth for for generic adderall

## 2013-03-04 ENCOUNTER — Other Ambulatory Visit: Payer: Self-pay | Admitting: *Deleted

## 2013-03-04 DIAGNOSIS — R351 Nocturia: Secondary | ICD-10-CM

## 2013-03-04 DIAGNOSIS — G479 Sleep disorder, unspecified: Secondary | ICD-10-CM

## 2013-03-08 ENCOUNTER — Telehealth: Payer: Self-pay | Admitting: Family Medicine

## 2013-03-08 NOTE — Telephone Encounter (Signed)
LM

## 2013-03-22 ENCOUNTER — Other Ambulatory Visit: Payer: Self-pay | Admitting: Family Medicine

## 2013-04-05 ENCOUNTER — Telehealth: Payer: Self-pay | Admitting: Internal Medicine

## 2013-04-05 MED ORDER — AMPHETAMINE-DEXTROAMPHET ER 15 MG PO CP24: 15.0000 mg | ORAL_CAPSULE | ORAL | Status: DC

## 2013-04-05 NOTE — Telephone Encounter (Signed)
Pt needs a refill on adderall XR 15mg  24 hr. Call when ready

## 2013-04-05 NOTE — Telephone Encounter (Signed)
Adderall renewed.

## 2013-04-14 ENCOUNTER — Ambulatory Visit (INDEPENDENT_AMBULATORY_CARE_PROVIDER_SITE_OTHER): Payer: 59 | Admitting: Family Medicine

## 2013-04-14 ENCOUNTER — Encounter: Payer: Self-pay | Admitting: Family Medicine

## 2013-04-14 VITALS — BP 124/80 | Wt 164.0 lb

## 2013-04-14 DIAGNOSIS — L989 Disorder of the skin and subcutaneous tissue, unspecified: Secondary | ICD-10-CM

## 2013-04-14 DIAGNOSIS — R109 Unspecified abdominal pain: Secondary | ICD-10-CM

## 2013-04-14 MED ORDER — HYDROCODONE-ACETAMINOPHEN 5-325MG PREPACK (~~LOC~~: 1.0000 | ORAL_TABLET | Freq: Four times a day (QID) | ORAL | Status: DC | PRN

## 2013-04-14 NOTE — Patient Instructions (Signed)
Take 2 Aleve twice per day first and if that doesn't help then use of Vicodin mainly at night. You cannot use it and drive

## 2013-04-14 NOTE — Progress Notes (Signed)
  Subjective:    Patient ID: [name] [name], female    DOB: 05-Sep-1966, 47 y.o.   MRN: [id number]  HPI I. Days ago she noted the onset of right lateral rib pain. It hurts to take a deep breath or lie on that side. No fever, chills, cough or congestion. No nausea, abdominal pain or vomiting. No relation to food. No urgency or frequentShe did try Tylenol today without success. She also has a lesion on her left upper chest and she would like evaluated. She states that this recently occurred.   Review of Systems     Objective:   Physical Exam Exam of the left upper chest does show a 1 cm raised slightly waxy appearing lesion. Lungs are clear to auscultation. No chest or rib tenderness. Abdominal exam shows no masses or tenderness.       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 Also recommend supportive care for the ribs using Aleve 2 pills twice per day. I will also get some codeine.

## 2013-04-15 ENCOUNTER — Other Ambulatory Visit: Payer: Self-pay | Admitting: Family Medicine

## 2013-04-15 DIAGNOSIS — R109 Unspecified abdominal pain: Secondary | ICD-10-CM

## 2013-04-15 MED ORDER — HYDROCODONE-ACETAMINOPHEN 5-325MG PREPACK (~~LOC~~: ORAL_TABLET | ORAL | Status: DC

## 2013-04-18 ENCOUNTER — Telehealth: Payer: Self-pay | Admitting: Internal Medicine

## 2013-04-18 DIAGNOSIS — K829 Disease of gallbladder, unspecified: Secondary | ICD-10-CM

## 2013-04-18 DIAGNOSIS — R0781 Pleurodynia: Secondary | ICD-10-CM

## 2013-04-18 NOTE — Telephone Encounter (Signed)
Pt states she is still in a lot of pain. Over the weekend it has gotten worse and notices it get worst after she eats. It has moved from the lateral side to right under her right breast area also. She doesn't feel she has a fever but she gets hot and cold a lot. Pt wants to know what to do next?

## 2013-04-18 NOTE — Telephone Encounter (Signed)
Set her up for a gallbladder ultrasound

## 2013-04-19 ENCOUNTER — Other Ambulatory Visit: Payer: 59

## 2013-04-19 NOTE — Telephone Encounter (Signed)
I have scheduled pt today for gallbladder @ 11.15am @ [address] only if she hasnt eaten or drinking anything after midnight. i did call pt but she was going to call me back.

## 2013-04-19 NOTE — Telephone Encounter (Signed)
Pt was unable to go today due to eating breakfast. i recancelled her for Thursday but she states she is unable to go then. i gave her the number so she can call herself and schedule her appt

## 2013-04-20 ENCOUNTER — Other Ambulatory Visit: Payer: 59

## 2013-04-20 ENCOUNTER — Ambulatory Visit (HOSPITAL_COMMUNITY)
Admission: RE | Admit: 2013-04-20 | Discharge: 2013-04-20 | Disposition: A | Discharge status: Home / Self Care | Payer: 59 | Source: Ambulatory Visit | Attending: Family Medicine | Admitting: Family Medicine

## 2013-04-20 ENCOUNTER — Other Ambulatory Visit: Payer: Self-pay | Admitting: Family Medicine

## 2013-04-20 DIAGNOSIS — K829 Disease of gallbladder, unspecified: Secondary | ICD-10-CM

## 2013-04-20 DIAGNOSIS — R0781 Pleurodynia: Secondary | ICD-10-CM

## 2013-04-20 DIAGNOSIS — R1011 Right upper quadrant pain: Secondary | ICD-10-CM | POA: Insufficient documentation

## 2013-04-20 NOTE — Progress Notes (Signed)
Quick Note:  LEFT MESSAGE WORD FOR WORDwon't know what else I can do until after I evaluate her and decide ______

## 2013-04-21 ENCOUNTER — Other Ambulatory Visit: Payer: 59

## 2013-04-26 ENCOUNTER — Ambulatory Visit (INDEPENDENT_AMBULATORY_CARE_PROVIDER_SITE_OTHER): Payer: 59 | Admitting: Medical

## 2013-04-26 ENCOUNTER — Encounter: Payer: Self-pay | Admitting: Medical

## 2013-04-26 VITALS — BP 132/82 | HR 68 | Temp 98.2°F | Resp 16 | Wt 163.0 lb

## 2013-04-26 DIAGNOSIS — R110 Nausea: Secondary | ICD-10-CM

## 2013-04-26 DIAGNOSIS — R1011 Right upper quadrant pain: Secondary | ICD-10-CM

## 2013-04-26 LAB — CBC WITH DIFFERENTIAL/PLATELET
Basophils Absolute: 0 10*3/uL (ref 0.0–0.1)
Basophils Relative: 0 % (ref 0–1)
Eosinophils Absolute: 0.1 10*3/uL (ref 0.0–0.7)
Eosinophils Relative: 2 % (ref 0–5)
HCT: 41 % (ref 36.0–46.0)
Hemoglobin: 14.3 g/dL (ref 12.0–15.0)
Lymphocytes Relative: 39 % (ref 12–46)
Lymphs Abs: 2.7 10*3/uL (ref 0.7–4.0)
MCH: 30.7 pg (ref 26.0–34.0)
MCHC: 34.9 g/dL (ref 30.0–36.0)
MCV: 88 fL (ref 78.0–100.0)
Monocytes Absolute: 0.7 10*3/uL (ref 0.1–1.0)
Monocytes Relative: 10 % (ref 3–12)
Neutro Abs: 3.4 10*3/uL (ref 1.7–7.7)
Neutrophils Relative %: 49 % (ref 43–77)
Platelets: 235 10*3/uL (ref 150–400)
RBC: 4.66 MIL/uL (ref 3.87–5.11)
RDW: 13.2 % (ref 11.5–15.5)
WBC: 7 10*3/uL (ref 4.0–10.5)

## 2013-04-26 LAB — POCT URINALYSIS DIPSTICK
Bilirubin, UA: NEGATIVE
Blood, UA: NEGATIVE
Glucose, UA: NEGATIVE
Ketones, UA: NEGATIVE
Leukocytes, UA: NEGATIVE
Nitrite, UA: NEGATIVE
Protein, UA: NEGATIVE
Spec Grav, UA: 1.01
Urobilinogen, UA: NEGATIVE
pH, UA: 5

## 2013-04-26 NOTE — Progress Notes (Signed)
Subjective:  [name] [name] is a 47 y.o. female who presents for 2 wk hx/o sharp pain underneath right breast.  Saw Dr. Susann Givens for this recently, few weeks ago.   She ended up having RUQ ultrasound, was given hydrocodone for pain.  ultrasound was reportedly normal.  Pain has gotten worse.  She reports constant sharp pain under right breast, worse after meals, worse with deep breathing, worse at night lying, better when propped up on pillows.  No radiation of pain.   No other chest pain, no SOB, no DOE, no wheezing, no fever.  Has some nausea, no vomiting.  No diarrhea, no blood in stool.  No other BM changes.  Using heat.  hydrocodone at night.  She did recently switched from Vyvanse to Adderall.  No other aggravating or relieving factors.    No other c/o.  The following portions of the patient's history were reviewed and updated as appropriate: allergies, current medications, past family history, past medical history, past social history, past surgical history and problem list.  ROS Otherwise as in subjective above  Objective: Physical Exam  Vital signs reviewed  General appearence: alert, no distress, WD/WN Oral cavity: MMM, no lesions Neck: supple, no lymphadenopathy, no thyromegaly, no masses Heart: RRR, normal S1, S2, no murmurs Lungs: CTA bilaterally, no wheezes, rhonchi, or rales Abdomen: +bs, soft, RUQ tenderness, +murphy's sign, non distended, no masses, no hepatomegaly, no splenomegaly Back: nontender Pulses: 2+ radial pulses, 2+ pedal pulses, normal cap refill Ext: no edema   Assessment: Encounter Diagnoses  Name Primary?  . Abdominal pain, right upper quadrant Yes  . Nausea     Plan: Recent abdominal ultrasound normal.  Labs today.  advised that symptoms suggest gall bladder issue despite normal ultrasound.  Pending labs, consider HIDA, CXR, KUB.  C/t hydrocodone prn, advised clear liquid diet for today/tomorrow, BRAT diet if desired.    Follow up: pending labs

## 2013-04-27 ENCOUNTER — Other Ambulatory Visit: Payer: Self-pay | Admitting: Medical

## 2013-04-27 ENCOUNTER — Telehealth: Payer: Self-pay | Admitting: Medical

## 2013-04-27 DIAGNOSIS — R1011 Right upper quadrant pain: Secondary | ICD-10-CM

## 2013-04-27 LAB — HEPATIC FUNCTION PANEL
ALT: 54 U/L — ABNORMAL HIGH (ref 0–35)
AST: 25 U/L (ref 0–37)
Albumin: 4.5 g/dL (ref 3.5–5.2)
Alkaline Phosphatase: 59 U/L (ref 39–117)
Bilirubin, Direct: 0.1 mg/dL (ref 0.0–0.3)
Indirect Bilirubin: 0.2 mg/dL (ref 0.0–0.9)
Total Bilirubin: 0.3 mg/dL (ref 0.3–1.2)
Total Protein: 6.8 g/dL (ref 6.0–8.3)

## 2013-04-27 LAB — BASIC METABOLIC PANEL
BUN: 12 mg/dL (ref 6–23)
CO2: 27 mEq/L (ref 19–32)
Calcium: 9.9 mg/dL (ref 8.4–10.5)
Chloride: 103 mEq/L (ref 96–112)
Creat: 0.95 mg/dL (ref 0.50–1.10)
Glucose, Bld: 79 mg/dL (ref 70–99)
Potassium: 4 mEq/L (ref 3.5–5.3)
Sodium: 139 mEq/L (ref 135–145)

## 2013-04-27 NOTE — Telephone Encounter (Signed)
i called pt, answered her questions.  She will have KUB Tuesday.

## 2013-05-03 ENCOUNTER — Ambulatory Visit (HOSPITAL_COMMUNITY)
Admission: RE | Admit: 2013-05-03 | Discharge: 2013-05-03 | Disposition: A | Discharge status: Home / Self Care | Payer: 59 | Source: Ambulatory Visit | Attending: Medical | Admitting: Medical

## 2013-05-03 DIAGNOSIS — Z90710 Acquired absence of both cervix and uterus: Secondary | ICD-10-CM | POA: Insufficient documentation

## 2013-05-03 DIAGNOSIS — R1011 Right upper quadrant pain: Secondary | ICD-10-CM | POA: Insufficient documentation

## 2013-05-03 DIAGNOSIS — R110 Nausea: Secondary | ICD-10-CM | POA: Insufficient documentation

## 2013-05-06 ENCOUNTER — Other Ambulatory Visit: Payer: Self-pay | Admitting: Family Medicine

## 2013-05-10 NOTE — Telephone Encounter (Signed)
IS THIS OK 

## 2013-05-10 NOTE — Telephone Encounter (Signed)
Okay to renew

## 2013-05-16 ENCOUNTER — Ambulatory Visit: Payer: 59 | Admitting: Family Medicine

## 2013-05-27 ENCOUNTER — Emergency Department (HOSPITAL_COMMUNITY): Payer: 59

## 2013-05-27 ENCOUNTER — Emergency Department (HOSPITAL_COMMUNITY)
Admission: EM | Admit: 2013-05-27 | Discharge: 2013-05-28 | Disposition: A | Discharge status: Home / Self Care | Payer: 59 | Attending: Emergency Medicine | Admitting: Emergency Medicine

## 2013-05-27 ENCOUNTER — Encounter (HOSPITAL_COMMUNITY): Payer: Self-pay

## 2013-05-27 DIAGNOSIS — W010XXA Fall on same level from slipping, tripping and stumbling without subsequent striking against object, initial encounter: Secondary | ICD-10-CM | POA: Insufficient documentation

## 2013-05-27 DIAGNOSIS — S2239XA Fracture of one rib, unspecified side, initial encounter for closed fracture: Secondary | ICD-10-CM | POA: Insufficient documentation

## 2013-05-27 DIAGNOSIS — Y9301 Activity, walking, marching and hiking: Secondary | ICD-10-CM | POA: Insufficient documentation

## 2013-05-27 DIAGNOSIS — S2232XA Fracture of one rib, left side, initial encounter for closed fracture: Secondary | ICD-10-CM

## 2013-05-27 DIAGNOSIS — Z79899 Other long term (current) drug therapy: Secondary | ICD-10-CM | POA: Insufficient documentation

## 2013-05-27 DIAGNOSIS — F329 Major depressive disorder, single episode, unspecified: Secondary | ICD-10-CM | POA: Insufficient documentation

## 2013-05-27 DIAGNOSIS — Y92009 Unspecified place in unspecified non-institutional (private) residence as the place of occurrence of the external cause: Secondary | ICD-10-CM | POA: Insufficient documentation

## 2013-05-27 DIAGNOSIS — Z8719 Personal history of other diseases of the digestive system: Secondary | ICD-10-CM | POA: Insufficient documentation

## 2013-05-27 DIAGNOSIS — F3289 Other specified depressive episodes: Secondary | ICD-10-CM | POA: Insufficient documentation

## 2013-05-27 DIAGNOSIS — Z87891 Personal history of nicotine dependence: Secondary | ICD-10-CM | POA: Insufficient documentation

## 2013-05-27 NOTE — ED Notes (Signed)
Pt reports falling off step to ground and reports pain to L mid back. Pt feels as if something is loose or rib is cracked. Pt denies hitting head/LOC. No injury noted to L side. No tenderness felt on palpation.

## 2013-05-28 LAB — URINALYSIS, ROUTINE W REFLEX MICROSCOPIC
Bilirubin Urine: NEGATIVE
Glucose, UA: NEGATIVE mg/dL
Hgb urine dipstick: NEGATIVE
Ketones, ur: NEGATIVE mg/dL
Leukocytes, UA: NEGATIVE
Nitrite: NEGATIVE
Protein, ur: NEGATIVE mg/dL
Specific Gravity, Urine: 1.012 (ref 1.005–1.030)
Urobilinogen, UA: 0.2 mg/dL (ref 0.0–1.0)
pH: 8 (ref 5.0–8.0)

## 2013-05-28 MED ORDER — OXYCODONE-ACETAMINOPHEN 5-325 MG PO TABS
2.0000 | ORAL_TABLET | Freq: Once | ORAL | Status: AC
  Administered 2013-05-28: 2 via ORAL
  Filled 2013-05-28: qty 2

## 2013-05-28 MED ORDER — IBUPROFEN 800 MG PO TABS: 800.0000 mg | ORAL_TABLET | Freq: Three times a day (TID) | ORAL | Status: DC

## 2013-05-28 MED ORDER — TRAMADOL HCL 50 MG PO TABS: 50.0000 mg | ORAL_TABLET | Freq: Four times a day (QID) | ORAL | Status: DC | PRN

## 2013-05-28 MED ORDER — ONDANSETRON 8 MG PO TBDP
8.0000 mg | ORAL_TABLET | Freq: Once | ORAL | Status: AC
  Administered 2013-05-28: 8 mg via ORAL
  Filled 2013-05-28: qty 1

## 2013-05-28 NOTE — ED Notes (Signed)
Provider at bedside

## 2013-05-28 NOTE — ED Provider Notes (Signed)
History     CSN: [id number]  Arrival date & time 05/27/13  2249   First MD Initiated Contact with Patient 05/27/13 2353      Chief Complaint  Patient presents with  . Back Pain    (Consider location/radiation/quality/duration/timing/severity/associated sxs/prior treatment) HPI Comments: Patient is a 47 year old female with a history of depression who presents for left-sided chest wall pain after a fall at home 4 hours ago. Patient states that she was walking on her deck when she lost her footing and fell on her left side. Patient states she had onset of sharp pain in her left side shortly after, worse with deep inspiration and palpation and without alleviating factors. Patient denies hitting her head or loss of consciousness. Patient denies vision changes, dizziness, chest pain, shortness of breath, nausea or vomiting, abdominal pain, and numbness or tingling in her extremities.  Patient is a 47 y.o. female presenting with back pain. The history is provided by the patient. No language interpreter was used.  Back Pain Associated symptoms: no abdominal pain, no chest pain, no fever and no numbness     Past Medical History  Diagnosis Date  . Depression   . Interstitial cystitis     Past Surgical History  Procedure Laterality Date  . Abdominal hysterectomy    . Knee surgery      both knees  . Breast reduction surgery      age 71yo  . Bladder surgery      due to interstitial cystitis    History reviewed. No pertinent family history.  History  Substance Use Topics  . Smoking status: Former Smoker    Quit date: 08/04/1990  . Smokeless tobacco: Never Used  . Alcohol Use: Yes     Comment: rare    OB History   Grav Para Term Preterm Abortions TAB SAB Ect Mult Living                  Review of Systems  Constitutional: Negative for fever.  Respiratory: Negative for chest tightness and shortness of breath.   Cardiovascular: Negative for chest pain.  Gastrointestinal:  Negative for nausea, vomiting and abdominal pain.  Musculoskeletal: Positive for back pain.  Skin: Negative for color change.  Neurological: Negative for dizziness and numbness.  All other systems reviewed and are negative.    Allergies  Review of patient's allergies indicates no known allergies.  Home Medications   Current Outpatient Rx  Name  Route  Sig  Dispense  Refill  . amphetamine-dextroamphetamine (ADDERALL XR) 15 MG 24 hr capsule   Oral   Take 1 capsule (15 mg total) by mouth every morning.   30 capsule   0   . buPROPion (WELLBUTRIN SR) 150 MG 12 hr tablet   Oral   Take 300 mg by mouth every morning.         . diphenhydrAMINE (BENADRYL) 25 MG tablet   Oral   Take 25 mg by mouth at bedtime as needed for sleep.         Marland Kitchen estrogen-methylTESTOSTERone 0.625-1.25 MG per tablet   Oral   Take 1 tablet by mouth at bedtime.         Marland Kitchen zolpidem (AMBIEN) 10 MG tablet   Oral   Take 5 mg by mouth at bedtime as needed for sleep.          Marland Kitchen ibuprofen (ADVIL,MOTRIN) 800 MG tablet   Oral   Take 1 tablet (800 mg total) by mouth 3 (  three) times daily.   21 tablet   0   . traMADol (ULTRAM) 50 MG tablet   Oral   Take 1 tablet (50 mg total) by mouth every 6 (six) hours as needed for pain.   15 tablet   0     BP 182/98  Pulse 82  Temp(Src) 98.5 F (36.9 C) (Oral)  Resp 20  SpO2 99%  Physical Exam  Nursing note and vitals reviewed. Constitutional: She is oriented to person, place, and time. She appears well-developed and well-nourished. No distress.  HENT:  Head: Normocephalic and atraumatic.  Eyes: Conjunctivae and EOM are normal.  Neck: Normal range of motion. Neck supple.  Cardiovascular: Normal rate, regular rhythm, normal heart sounds and intact distal pulses.   Pulmonary/Chest: Effort normal and breath sounds normal. No respiratory distress. She has no wheezes. She has no rales. She exhibits tenderness and bony tenderness. She exhibits no laceration, no  crepitus, no edema, no deformity, no swelling and no retraction.    Abdominal: Soft. She exhibits no distension. There is no tenderness. There is no rebound and no guarding.  Musculoskeletal: Normal range of motion. She exhibits no edema.  Neurological: She is alert and oriented to person, place, and time.  Skin: Skin is warm and dry. No rash noted. She is not diaphoretic. No erythema. No pallor.  Psychiatric: She has a normal mood and affect. Her behavior is normal.    ED Course  Procedures (including critical care time)  Labs Reviewed  URINALYSIS, ROUTINE W REFLEX MICROSCOPIC   Dg Ribs Unilateral W/chest Left  05/28/2013   *RADIOLOGY REPORT*  Clinical Data: Left posterior rib pain, shortness of breath and nausea following a fall.  The  LEFT RIBS AND CHEST - 3+ VIEW  Comparison: None.  Findings: Normal sized heart.  Clear lungs.  Minimally displaced left posterolateral 9th rib fracture.  Small twelfth ribs.  No pneumothorax.  Old, healed left posterolateral sixth and seventh rib fractures.  Mild lumbar spine degenerative changes and minimal scoliosis.  IMPRESSION: Minimally displaced left ninth rib fracture without pneumothorax.   Original Report Authenticated By: Beckie Salts, M.D.     1. Rib fracture, left, closed, initial encounter      MDM  Left-sided chest wall pain x4 hours. X-ray significant for minimally displaced left posterolateral ninth rib fracture. Questionable nondisplaced 10th rib fracture by my interpretation. Physical exam significant for bony tenderness to palpation of the left lateral chest wall. Lungs clear to auscultation bilaterally an x-ray without evidence of pneumothorax. Patient appropriate for discharge with primary care followup to ensure resolution of symptoms. 800 mg ibuprofen every 8 hours advised her discomfort. Rx for Tramadol prescribed for severe pain. Indications for ED return discussed. Patient states comfort and understanding with this discharge plan  with no unaddressed concerns.       Antony Madura, PA-C 05/28/13 [phone]

## 2013-05-28 NOTE — ED Provider Notes (Signed)
Medical screening examination/treatment/procedure(s) were performed by non-physician practitioner and as supervising physician I was immediately available for consultation/collaboration.  Olivia Mackie, MD 05/28/13 [phone]

## 2013-06-28 ENCOUNTER — Telehealth: Payer: Self-pay | Admitting: Family Medicine

## 2013-06-28 NOTE — Telephone Encounter (Signed)
If her mammograms have all been normal, she can go every 2 years

## 2013-06-28 NOTE — Telephone Encounter (Signed)
Left word for word message  

## 2013-07-04 ENCOUNTER — Telehealth: Payer: Self-pay | Admitting: Internal Medicine

## 2013-07-04 MED ORDER — AMPHETAMINE-DEXTROAMPHET ER 15 MG PO CP24: 15.0000 mg | ORAL_CAPSULE | ORAL | Status: DC

## 2013-07-04 NOTE — Telephone Encounter (Signed)
Pt needs a refill on her adderall. Call when ready

## 2013-07-18 ENCOUNTER — Encounter: Payer: Self-pay | Admitting: Medical

## 2013-07-18 ENCOUNTER — Ambulatory Visit (INDEPENDENT_AMBULATORY_CARE_PROVIDER_SITE_OTHER): Payer: 59 | Admitting: Medical

## 2013-07-18 VITALS — BP 120/80 | HR 76 | Temp 98.0°F | Resp 16 | Wt 165.0 lb

## 2013-07-18 DIAGNOSIS — G4700 Insomnia, unspecified: Secondary | ICD-10-CM

## 2013-07-18 DIAGNOSIS — R059 Cough, unspecified: Secondary | ICD-10-CM

## 2013-07-18 DIAGNOSIS — R05 Cough: Secondary | ICD-10-CM

## 2013-07-18 DIAGNOSIS — J029 Acute pharyngitis, unspecified: Secondary | ICD-10-CM

## 2013-07-18 DIAGNOSIS — J069 Acute upper respiratory infection, unspecified: Secondary | ICD-10-CM

## 2013-07-18 LAB — POCT RAPID STREP A (OFFICE): Rapid Strep A Screen: NEGATIVE

## 2013-07-18 MED ORDER — ZOLPIDEM TARTRATE 10 MG PO TABS: 5.0000 mg | ORAL_TABLET | Freq: Every evening | ORAL | Status: DC | PRN

## 2013-07-18 MED ORDER — HYDROCODONE-HOMATROPINE 5-1.5 MG/5ML PO SYRP: 5.0000 mL | ORAL_SOLUTION | Freq: Three times a day (TID) | ORAL | Status: DC | PRN

## 2013-07-18 NOTE — Progress Notes (Signed)
Subjective:  [name] [name] is a 47 y.o. female who presents for 3 day hx/o sore throat, headache, ear pressure, cough.   Cough is nonproductive but frequent, keeping her up at night too.   Her daughter has been sick recently with similar.   She is currently using nothing for her symptoms.  Denies fever, NVD, rash.  Denies wheezing, SOB, chest pain.  No other aggravating or relieving factors.  Needs refill on her Ambien.  Uses 1/2 tablet QHS for sleep.  No other c/o.   Objective:  Filed Vitals:   07/18/13 1637  BP: 120/80  Pulse: 76  Temp: 98 F (36.7 C)  Resp: 16    General appearance: Alert, WD/WN, no distress, mildly ill appearing                             Skin: warm, no rash                           Head: no sinus tenderness                            Eyes: conjunctiva normal, corneas clear, PERRLA                            Ears: pearly TMs, external ear canals normal                          Nose: septum midline, turbinates swollen, with erythema and clear discharge             Mouth/throat: MMM, tongue normal, mild pharyngeal erythema                           Neck: supple, no adenopathy, no thyromegaly, nontender                          Heart: RRR, normal S1, S2, no murmurs                         Lungs: CTA bilaterally, no wheezes, rales, or rhonchi     Assessment and Plan: Encounter Diagnoses  Name Primary?  . Viral upper respiratory infection Yes  . Cough   . Sore throat   . Insomnia    Discussed diagnosis and treatment of URI.  Suggested symptomatic OTC remedies.  Nasal saline spray for congestion, salt water gargles.  Tylenol or Ibuprofen OTC for fever and malaise.  Call/return in 2-3 days if symptoms aren't resolving.  Refilled her Ambien.

## 2013-07-18 NOTE — Patient Instructions (Signed)
Increase water intake.   Begin OTC Mucinex DM or Robitussin DM.   Rest.  Use the Hycodan syrup for worse cough.  Caution - this may cause drowsiness.   Use salt water gargles.  Use nasal saline mist.  If not improving by mid to late week, call back.

## 2013-07-19 LAB — POCT RAPID STREP A (OFFICE): Rapid Strep A Screen: NEGATIVE

## 2013-07-19 NOTE — Addendum Note (Signed)
Addended by: Leretha Dykes L on: 07/19/2013 12:01 PM   Modules accepted: Orders

## 2013-08-05 ENCOUNTER — Encounter: Payer: 59 | Admitting: Family Medicine

## 2013-08-17 ENCOUNTER — Ambulatory Visit (INDEPENDENT_AMBULATORY_CARE_PROVIDER_SITE_OTHER): Payer: 59 | Admitting: Family Medicine

## 2013-08-17 ENCOUNTER — Encounter: Payer: Self-pay | Admitting: Family Medicine

## 2013-08-17 VITALS — BP 128/90 | HR 78 | Ht 64.0 in | Wt 166.0 lb

## 2013-08-17 DIAGNOSIS — M25512 Pain in left shoulder: Secondary | ICD-10-CM

## 2013-08-17 DIAGNOSIS — M25519 Pain in unspecified shoulder: Secondary | ICD-10-CM

## 2013-08-17 DIAGNOSIS — Z0000 Encounter for general adult medical examination without abnormal findings: Secondary | ICD-10-CM

## 2013-08-17 DIAGNOSIS — G479 Sleep disorder, unspecified: Secondary | ICD-10-CM

## 2013-08-17 DIAGNOSIS — F988 Other specified behavioral and emotional disorders with onset usually occurring in childhood and adolescence: Secondary | ICD-10-CM

## 2013-08-17 DIAGNOSIS — F341 Dysthymic disorder: Secondary | ICD-10-CM

## 2013-08-17 LAB — POCT URINALYSIS DIPSTICK
Bilirubin, UA: NEGATIVE
Blood, UA: NEGATIVE
Glucose, UA: NEGATIVE
Ketones, UA: NEGATIVE
Leukocytes, UA: NEGATIVE
Nitrite, UA: NEGATIVE
Protein, UA: NEGATIVE
Spec Grav, UA: >=1.03
Urobilinogen, UA: NEGATIVE
pH, UA: 5

## 2013-08-17 NOTE — Progress Notes (Signed)
  Subjective:    Patient ID: [name] [name], female    DOB: [birth year], 47 y.o.   MRN: [id number]  HPI She is here for complete examination. She complains of a one-month history of left arm pain. No history of recent injury however she has had previous surgery on this. She has not tried any medications. She continues on her Wellbutrin and seems to be doing fairly well on this. She also has underlying ADD and continues on Adderall. She complains of a long history of sleep disturbance and is using Benadryl as well as occasionally alcohol and Ambien. She cites life stresses and has more difficulty when her husband is out of town. She was seen by sleep specialist however the workup including doing a sleep study was not done due to cost. She also has concerns over staying on estrogen and testosterone replacement. She did have a total hysterectomy.   Review of Systems Negative except as above    Objective:   Physical Exam BP 128/90  Pulse 78  Ht 5\' 4"  (1.626 m)  Wt 166 lb (75.297 kg)  BMI 28.48 kg/m2  General Appearance:    Alert, cooperative, no distress, appears stated age  Head:    Normocephalic, without obvious abnormality, atraumatic  Eyes:    PERRL, conjunctiva/corneas clear, EOM's intact, fundi    benign  Ears:    Normal TM's and external ear canals  Nose:   Nares normal, mucosa normal, no drainage or sinus   tenderness  Throat:   Lips, mucosa, and tongue normal; teeth and gums normal  Neck:   Supple, no lymphadenopathy;  thyroid:  no   enlargement/tenderness/nodules; no carotid   bruit or JVD  Back:    Spine nontender, no curvature, ROM normal, no CVA     tenderness  Lungs:     Clear to auscultation bilaterally without wheezes, rales or     ronchi; respirations unlabored  Chest Wall:    No tenderness or deformity   Heart:    Regular rate and rhythm, S1 and S2 normal, no murmur, rub   or gallop  Breast Exam:    Deferred to GYN  Abdomen:     Soft, non-tender, nondistended, normoactive  bowel sounds,    no masses, no hepatosplenomegaly  Genitalia:    Deferred to GYN     Extremities:   No clubbing, cyanosis or edema  Pulses:   2+ and symmetric all extremities  Skin:   Skin color, texture, turgor normal, no rashes or lesions  Lymph nodes:   Cervical, supraclavicular, and axillary nodes normal  Neurologic:   CNII-XII intact, normal strength, sensation and gait; reflexes 2+ and symmetric throughout          Psych:   Normal mood, affect, hygiene and grooming.          Assessment & Plan:  Routine general medical examination at a health care facility - Plan: POCT Urinalysis Dipstick, Lipid panel  Dysthymia  ADD (attention deficit disorder)  Sleep disturbance  Left shoulder pain  2 guaiac cards will be given. Information on sleep disturbance given. Spent a great deal of time discussing the need for her to recognize that sleeping is a learned behavior and she has learned not to sleep. Strongly encourage her to avoid alcohol as well as Benadryl at night.

## 2013-08-17 NOTE — Patient Instructions (Addendum)
Take 2 Aleve twice per day for the next 10 days to 2 weeks and if still having difficulty let me knowInsomnia Insomnia is frequent trouble falling and/or staying asleep. Insomnia can be a long term problem or a short term problem. Both are common. Insomnia can be a short term problem when the wakefulness is related to a certain stress or worry. Long term insomnia is often related to ongoing stress during waking hours and/or poor sleeping habits. Overtime, sleep deprivation itself can make the problem worse. Every little thing feels more severe because you are overtired and your ability to cope is decreased. CAUSES   Stress, anxiety, and depression.  Poor sleeping habits.  Distractions such as TV in the bedroom.  Naps close to bedtime.  Engaging in emotionally charged conversations before bed.  Technical reading before sleep.  Alcohol and other sedatives. They may make the problem worse. They can hurt normal sleep patterns and normal dream activity.  Stimulants such as caffeine for several hours prior to bedtime.  Pain syndromes and shortness of breath can cause insomnia.  Exercise late at night.  Changing time zones may cause sleeping problems (jet lag). It is sometimes helpful to have someone observe your sleeping patterns. They should look for periods of not breathing during the night (sleep apnea). They should also look to see how long those periods last. If you live alone or observers are uncertain, you can also be observed at a sleep clinic where your sleep patterns will be professionally monitored. Sleep apnea requires a checkup and treatment. Give your caregivers your medical history. Give your caregivers observations your family has made about your sleep.  SYMPTOMS   Not feeling rested in the morning.  Anxiety and restlessness at bedtime.  Difficulty falling and staying asleep. TREATMENT   Your caregiver may prescribe treatment for an underlying medical disorders. Your  caregiver can give advice or help if you are using alcohol or other drugs for self-medication. Treatment of underlying problems will usually eliminate insomnia problems.  Medications can be prescribed for short time use. They are generally not recommended for lengthy use.  Over-the-counter sleep medicines are not recommended for lengthy use. They can be habit forming.  You can promote easier sleeping by making lifestyle changes such as:  Using relaxation techniques that help with breathing and reduce muscle tension.  Exercising earlier in the day.  Changing your diet and the time of your last meal. No night time snacks.  Establish a regular time to go to bed.  Counseling can help with stressful problems and worry.  Soothing music and white noise may be helpful if there are background noises you cannot remove.  Stop tedious detailed work at least one hour before bedtime. HOME CARE INSTRUCTIONS   Keep a diary. Inform your caregiver about your progress. This includes any medication side effects. See your caregiver regularly. Take note of:  Times when you are asleep.  Times when you are awake during the night.  The quality of your sleep.  How you feel the next day. This information will help your caregiver care for you.  Get out of bed if you are still awake after 15 minutes. Read or do some quiet activity. Keep the lights down. Wait until you feel sleepy and go back to bed.  Keep regular sleeping and waking hours. Avoid naps.  Exercise regularly.  Avoid distractions at bedtime. Distractions include watching television or engaging in any intense or detailed activity like attempting to balance the household  checkbook.  Develop a bedtime ritual. Keep a familiar routine of bathing, brushing your teeth, climbing into bed at the same time each night, listening to soothing music. Routines increase the success of falling to sleep faster.  Use relaxation techniques. This can be using  breathing and muscle tension release routines. It can also include visualizing peaceful scenes. You can also help control troubling or intruding thoughts by keeping your mind occupied with boring or repetitive thoughts like the old concept of counting sheep. You can make it more creative like imagining planting one beautiful flower after another in your backyard garden.  During your day, work to eliminate stress. When this is not possible use some of the previous suggestions to help reduce the anxiety that accompanies stressful situations. MAKE SURE YOU:   Understand these instructions.  Will watch your condition.  Will get help right away if you are not doing well or get worse. Document Released: 11/28/2000 Document Revised: 02/23/2012 Document Reviewed: 12/29/2007 [facility] Patient Information 2014 [place], [place].

## 2013-08-18 LAB — LIPID PANEL
Cholesterol: 152 mg/dL (ref 0–200)
HDL: 41 mg/dL (ref >39)
LDL Cholesterol: 96 mg/dL (ref 0–99)
Total CHOL/HDL Ratio: 3.7 Ratio
Triglycerides: 73 mg/dL (ref <150)
VLDL: 15 mg/dL (ref 0–40)

## 2013-08-18 NOTE — Progress Notes (Signed)
Quick Note:  LEFT MESSAGE LIPIDS LOOK GOOD ______

## 2013-08-23 ENCOUNTER — Other Ambulatory Visit (INDEPENDENT_AMBULATORY_CARE_PROVIDER_SITE_OTHER): Payer: 59

## 2013-08-23 DIAGNOSIS — Z0000 Encounter for general adult medical examination without abnormal findings: Secondary | ICD-10-CM

## 2013-08-23 DIAGNOSIS — Z1211 Encounter for screening for malignant neoplasm of colon: Secondary | ICD-10-CM

## 2013-08-23 LAB — HEMOCCULT GUIAC POC 1CARD (OFFICE)
Card #2 Fecal Occult Blod, POC: NEGATIVE
Card #3 Fecal Occult Blood, POC: NEGATIVE
Fecal Occult Blood, POC: NEGATIVE

## 2013-08-28 ENCOUNTER — Ambulatory Visit (INDEPENDENT_AMBULATORY_CARE_PROVIDER_SITE_OTHER): Payer: 59 | Admitting: Emergency Medicine

## 2013-08-28 VITALS — BP 154/92 | HR 94 | Temp 100.1°F | Resp 16 | Ht 65.0 in | Wt 167.0 lb

## 2013-08-28 DIAGNOSIS — G5680 Other specified mononeuropathies of unspecified upper limb: Secondary | ICD-10-CM

## 2013-08-28 DIAGNOSIS — G5682 Other specified mononeuropathies of left upper limb: Secondary | ICD-10-CM

## 2013-08-28 MED ORDER — HYDROCODONE-ACETAMINOPHEN 5-325 MG PO TABS: 1.0000 | ORAL_TABLET | ORAL | Status: DC | PRN

## 2013-08-28 MED ORDER — NAPROXEN SODIUM 550 MG PO TABS: 550.0000 mg | ORAL_TABLET | Freq: Two times a day (BID) | ORAL | Status: DC

## 2013-08-28 MED ORDER — CYCLOBENZAPRINE HCL 10 MG PO TABS: 10.0000 mg | ORAL_TABLET | Freq: Three times a day (TID) | ORAL | Status: DC | PRN

## 2013-08-28 NOTE — Progress Notes (Signed)
[facility] [address], [place] [address] [phone]  Date:  08/28/2013   Name:  [name] [name]   DOB:  07-Nov-1966   MRN:  [id number]  PCP:  Carollee Herter, MD    Chief Complaint: Shoulder Pain   History of Present Illness:  [name] [name] is a 47 y.o. very pleasant female patient who presents with the following:  No history of injury.  Pain in left posterior shoulder since Thursday.  Pain into left arm with no neuro symptoms.  No neck pain.  No history of prior symptoms similar to this.  Worse over weekend.  No improvement with over the counter medications or other home remedies. D.jd   Patient Active Problem List   Diagnosis Date Noted  . Dysthymia 08/04/2012  . ADD (attention deficit disorder) 08/04/2012  . Sleep disturbance 08/04/2012  . IC (interstitial cystitis) 08/04/2012  . H/O hysterectomy for benign disease 04/07/2012    Past Medical History  Diagnosis Date  . Depression   . Interstitial cystitis     Past Surgical History  Procedure Laterality Date  . Abdominal hysterectomy    . Knee surgery      both knees  . Breast reduction surgery      age 34yo  . Bladder surgery      due to interstitial cystitis    History  Substance Use Topics  . Smoking status: Former Smoker    Quit date: 08/04/1990  . Smokeless tobacco: Never Used  . Alcohol Use: Yes     Comment: rare    Family History  Problem Relation Age of Onset  . Stroke Brother     No Known Allergies  Medication list has been reviewed and updated.  Current Outpatient Prescriptions on File Prior to Visit  Medication Sig Dispense Refill  . amphetamine-dextroamphetamine (ADDERALL XR) 15 MG 24 hr capsule Take 1 capsule (15 mg total) by mouth every morning.  30 capsule  0  . buPROPion (WELLBUTRIN SR) 150 MG 12 hr tablet Take 300 mg by mouth every morning.      . estrogen-methylTESTOSTERone 0.625-1.25 MG per tablet Take 1 tablet by mouth at bedtime.      Marland Kitchen zolpidem  (AMBIEN) 10 MG tablet Take 0.5 tablets (5 mg total) by mouth at bedtime as needed for sleep.  30 tablet  0  . diphenhydrAMINE (BENADRYL) 25 MG tablet Take 25 mg by mouth at bedtime as needed for sleep.       No current facility-administered medications on file prior to visit.    Review of Systems:  As per HPI, otherwise negative.    Physical Examination: Filed Vitals:   08/28/13 1539  BP: 154/92  Pulse: 94  Temp: 100.1 F (37.8 C)  Resp: 16   Filed Vitals:   08/28/13 1539  Height: 5\' 5"  (1.651 m)  Weight: 167 lb (75.751 kg)   Body mass index is 27.79 kg/(m^2). Ideal Body Weight: Weight in (lb) to have BMI = 25: 149.9   GEN: WDWN, NAD, Non-toxic, Alert & Oriented x 3 HEENT: Atraumatic, Normocephalic.  Ears and Nose: No external deformity. EXTR: No clubbing/cyanosis/edema NEURO: Normal gait.  PSYCH: Normally interactive. Conversant. Not depressed or anxious appearing.  Calm demeanor.  SHOULDER left posterior shoulder tender with trigger point medial and superior to scapular angle.  Assessment and Plan: Scapulocostal syndrome Anaprox Flexeril vicodin Local ice and heat Follow up in a couple days if not improved. Signed,  Phillips Odor, MD

## 2013-08-30 ENCOUNTER — Telehealth: Payer: Self-pay | Admitting: Family Medicine

## 2013-08-30 MED ORDER — BUPROPION HCL ER (SR) 150 MG PO TB12: 300.0000 mg | ORAL_TABLET | Freq: Every morning | ORAL | Status: DC

## 2013-08-30 NOTE — Telephone Encounter (Signed)
Bupropion renewed

## 2013-09-01 ENCOUNTER — Telehealth: Payer: Self-pay | Admitting: Family Medicine

## 2013-09-01 MED ORDER — BUPROPION HCL ER (SR) 150 MG PO TB12: 150.0000 mg | ORAL_TABLET | Freq: Two times a day (BID) | ORAL | Status: DC

## 2013-09-01 NOTE — Telephone Encounter (Signed)
done

## 2013-09-09 ENCOUNTER — Ambulatory Visit (HOSPITAL_COMMUNITY)
Admission: RE | Admit: 2013-09-09 | Discharge: 2013-09-09 | Disposition: A | Discharge status: Home / Self Care | Payer: 59 | Source: Ambulatory Visit | Attending: Family Medicine | Admitting: Family Medicine

## 2013-09-09 ENCOUNTER — Institutional Professional Consult (permissible substitution): Payer: 59 | Admitting: Family Medicine

## 2013-09-09 ENCOUNTER — Encounter: Payer: Self-pay | Admitting: Family Medicine

## 2013-09-09 ENCOUNTER — Ambulatory Visit (INDEPENDENT_AMBULATORY_CARE_PROVIDER_SITE_OTHER): Payer: 59 | Admitting: Family Medicine

## 2013-09-09 VITALS — BP 137/93 | HR 102 | Ht 65.0 in | Wt 167.0 lb

## 2013-09-09 DIAGNOSIS — R29898 Other symptoms and signs involving the musculoskeletal system: Secondary | ICD-10-CM

## 2013-09-09 DIAGNOSIS — M5030 Other cervical disc degeneration, unspecified cervical region: Secondary | ICD-10-CM | POA: Insufficient documentation

## 2013-09-09 DIAGNOSIS — M25519 Pain in unspecified shoulder: Secondary | ICD-10-CM | POA: Insufficient documentation

## 2013-09-09 DIAGNOSIS — M79609 Pain in unspecified limb: Secondary | ICD-10-CM | POA: Insufficient documentation

## 2013-09-09 DIAGNOSIS — M542 Cervicalgia: Secondary | ICD-10-CM | POA: Insufficient documentation

## 2013-09-09 DIAGNOSIS — M5010 Cervical disc disorder with radiculopathy, unspecified cervical region: Secondary | ICD-10-CM

## 2013-09-09 DIAGNOSIS — M5412 Radiculopathy, cervical region: Secondary | ICD-10-CM

## 2013-09-09 MED ORDER — GABAPENTIN 100 MG PO CAPS: 200.0000 mg | ORAL_CAPSULE | Freq: Every day | ORAL | Status: DC

## 2013-09-09 NOTE — Progress Notes (Signed)
CC: Left periscapular and left arm pain HPI: Patient is a pleasant 47 year old female who presents for evaluation of left periscapular and left arm pain. She states that this started suddenly about 2 weeks ago without injury or trigger. She denies any neck pain. She states that initially the pain started around her scapular region and she tried some massage which was not helpful. Pain along the scapula is described as hot. She is then started to develop some pain on her left anterior chest wall. She was seen in urgent care and was prescribed Flexeril, Aleve, and Vicodin. She states that throughout the course of the week she has had worsening pain down her left arm as well as weakness in her hand. She describes the pain down her arm as radiating from the lateral upper arm down the ulnar aspect of the forearm and into the fourth and fifth fingers. It is dull. She states that her fourth and fifth fingers are none. She also has decreased strength in her hand. She denies any history of known injury.  ROS: As above in the HPI. All other systems are stable or negative.  PMH: Anxiety, ADHD, insomnia  PSH: Shoulder surgery  Social: Patient works at the [employer]. She does not smoke. She drinks a glass of wine occasionally. Family: Family history is positive for diabetes in a brother and en caul, positive for heart disease in her father, positive for high blood pressure in mother, father, and brother.  Allergies: No known drug allergies    OBJECTIVE: APPEARANCE:  Patient in no acute distress.The patient appeared well nourished and normally developed. HEENT: No scleral icterus. Conjunctiva non-injected Resp: Non labored Skin: No rash MSK:  Neck: Equivocal spurling's Full neck range of motion No midline or paraspinal muscle tenderness to palpation Tenderness along the trapezius and rhomboid on the left Strength is 5 out of 5 in shoulder abduction, biceps, triceps, wrist flexion and extension.  Strength 4+ out of 5 on the left as compared to 5 out of 5 on the right. Strength 4/5 on thumb opposition and interosseus testing on the left. Decreased sensation to light touch on the fifth finger on the left Triceps reflex 1+ on the left and 2+ on the right     ASSESSMENT: #1. 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 She does not have any neck pain which is less characteristic of C8 radiculopathy in the neck. Differential diagnosis also includes Central processes as well as more peripheral nerve involvement. We would recommend at this point starting with trial of gabapentin as well as obtaining neck x-rays. We will see her back in 2 weeks. We did discuss that an MRI would be extremely helpful in this situation however patient is concerned about cost and would like to wait until her followup visit to obtain this.

## 2013-09-09 NOTE — Patient Instructions (Signed)
Thank you for coming in today  We are worried that you have a problem with a nerve that is causing pain and weakness We will get xrays of your neck Try gabapentin at night. Start at 2 tablets x 4 days then okay to increase to 3 tablets x 4 days then 4 tablets x 4 days Followup in 2 weeks.

## 2013-09-12 ENCOUNTER — Other Ambulatory Visit: Payer: Self-pay | Admitting: *Deleted

## 2013-09-12 ENCOUNTER — Telehealth: Payer: Self-pay | Admitting: Family Medicine

## 2013-09-12 ENCOUNTER — Encounter: Payer: Self-pay | Admitting: *Deleted

## 2013-09-12 DIAGNOSIS — R29898 Other symptoms and signs involving the musculoskeletal system: Secondary | ICD-10-CM

## 2013-09-12 NOTE — Progress Notes (Signed)
[facility]: Attending Note: I have reviewed the chart, discussed wit the Sports Medicine Fellow. I agree with assessment and treatment plan as detailed in the Fellow's note. I really want to proceed with the MRI but she does not want to do that at this time. I think gabapentin and neck films are reasonable first step. Ultimately she may need MRI of the cervical spine as well as brain given the unusual presentation, central processes such as multiple sclerosis are in the differential diagnosis.

## 2013-09-12 NOTE — Patient Instructions (Signed)
DR IBAZABO MON OCT 20TH 2PM ARRIVE AT 145PM [address] [phone] NO SHORT SLEEVE, JEWELERY, LOTION. 1 HOUR APPT

## 2013-09-12 NOTE — Telephone Encounter (Signed)
[name] [name] Please let her know that the x rays show only mild degenertive changes which is great news but it shows nothing that would account for her left arm and hand weakness (which is not that great news).   I really think she needs either 1)  MRI--she is set to f/u in 2 weeks and we can continue w that plan OR if she has changed her mind we can do neck MRI now  2) OR we could do nerve conductions studies first.  Ultimately she may need more than one test (as we told her last week).

## 2013-09-13 ENCOUNTER — Telehealth: Payer: Self-pay | Admitting: Family Medicine

## 2013-09-13 NOTE — Telephone Encounter (Signed)
Called patient to discuss xray results. No answer. Left message.

## 2013-09-21 ENCOUNTER — Other Ambulatory Visit: Payer: Self-pay

## 2013-09-21 ENCOUNTER — Other Ambulatory Visit: Payer: Self-pay | Admitting: Family Medicine

## 2013-09-21 MED ORDER — EST ESTROGENS-METHYLTEST 0.625-1.25 MG PO TABS: ORAL_TABLET | ORAL | Status: DC

## 2013-09-21 NOTE — Telephone Encounter (Signed)
SENT IN ESTTROGENS

## 2013-09-23 ENCOUNTER — Ambulatory Visit: Payer: 59 | Admitting: Family Medicine

## 2013-10-03 ENCOUNTER — Other Ambulatory Visit: Payer: Self-pay

## 2013-10-03 ENCOUNTER — Telehealth: Payer: Self-pay | Admitting: Family Medicine

## 2013-10-03 MED ORDER — AMPHETAMINE-DEXTROAMPHET ER 15 MG PO CP24: 15.0000 mg | ORAL_CAPSULE | ORAL | Status: DC

## 2013-10-03 MED ORDER — ZOLPIDEM TARTRATE 10 MG PO TABS: 5.0000 mg | ORAL_TABLET | Freq: Every evening | ORAL | Status: DC | PRN

## 2013-10-03 NOTE — Telephone Encounter (Signed)
Adderall will be renewed. Hand and will also be renewed

## 2013-10-03 NOTE — Telephone Encounter (Signed)
Called ambiem called pt to let her know adderall rx are ready

## 2013-10-03 NOTE — Telephone Encounter (Signed)
Renew the Ambien 

## 2013-10-10 ENCOUNTER — Encounter: Payer: Self-pay | Admitting: Family Medicine

## 2013-10-10 ENCOUNTER — Ambulatory Visit (INDEPENDENT_AMBULATORY_CARE_PROVIDER_SITE_OTHER): Payer: 59 | Admitting: Family Medicine

## 2013-10-10 VITALS — BP 140/90 | HR 80 | Temp 98.1°F | Ht 65.0 in | Wt 164.0 lb

## 2013-10-10 DIAGNOSIS — J309 Allergic rhinitis, unspecified: Secondary | ICD-10-CM

## 2013-10-10 DIAGNOSIS — M5412 Radiculopathy, cervical region: Secondary | ICD-10-CM

## 2013-10-10 DIAGNOSIS — J069 Acute upper respiratory infection, unspecified: Secondary | ICD-10-CM

## 2013-10-10 NOTE — Patient Instructions (Signed)
You likely have underlying seasonal allergies.  Use claritin once daily.   You seem to have a current viral illness.  I recommend adding Mucinex to loosen up the phlegm.  Consider trying sinus rinses (rinse kit or the Neti-pot). Use delsym syrup at bedtime if needed as a cough suppressant (vs getting the DM version of guaifenesin-containing medication).  Call the end of this week or early next week if you continue to have discolored sinus drainage, any fevers or other worsening symptoms.  I expect if this is viral that you will start feeling better by the end of this week, but may take up to another week to be fully better.  You likely have underlying allergies, and the clear congestion and sinus drainage may persist for longer.  Okay to stop the neurontin

## 2013-10-10 NOTE — Progress Notes (Signed)
Chief Complaint  Patient presents with  . Cough    hoarseness, upset stomach. Just doesn't feel well since Saturday. No vomiting or diarrhea, stool is a little softer than usual. Very nauseous today.    3 days ago.  She was having some mild sinus issues on 11/6 when she got her flu shot.  She has had some ongoing mild sinus drainage since then. For the last 3 days symptoms have changed--cough, hoarseness, and today has developed nausea.  Nasal mucus is clear.  She feels congestion in her chest, unable to expectorate.  No fevers.  Denies wheezing or shortness of breath.  Denies sinus headaches (just earlier this month).  Cough is all the time, slightly worse at night.  Hasn't tried any OTC meds.  Denies sick contacts. She usually has problems with sinus headaches with season changes, this time of year  She has been having neck pain with radiculopathy into her Left arm spreading into L 4th and 5th fingers.  Saw Dr. Jennette Kettle.  Recommended MRI and EMG/NCV, but she never had these done.  She was put on Neurontin, seeing chiropractor (and getting traction) and her symptoms have completely resolved. Wondering if she can stop the neurontin--she is only currently taking 100mg  at bedtime.  Denies any further weakness, but has very slight residual problem in 4th and 5th fingers--but not weak, no longer tingling, just feels slightly different.  Past Medical History  Diagnosis Date  . Depression   . Interstitial cystitis   . Cervical radiculopathy 2014    L arm (into L4th and 5th fingers)   Past Surgical History  Procedure Laterality Date  . Abdominal hysterectomy    . Knee surgery      both knees  . Breast reduction surgery      age 47yo  . Bladder surgery      due to interstitial cystitis   History   Social History  . Marital Status: Married    Spouse Name: N/A    Number of Children: N/A  . Years of Education: N/A   Occupational History  . Not on file.   Social History Main Topics  . Smoking  status: Former Smoker    Quit date: 08/04/1990  . Smokeless tobacco: Never Used  . Alcohol Use: Yes     Comment: rare  . Drug Use: No  . Sexual Activity: Yes    Birth Control/ Protection: Surgical   Other Topics Concern  . Not on file   Social History Narrative  . No narrative on file   Current outpatient prescriptions:amphetamine-dextroamphetamine (ADDERALL XR) 15 MG 24 hr capsule, Take 1 capsule (15 mg total) by mouth every morning., Disp: 30 capsule, Rfl: 0;  [START ON 11/03/2013] amphetamine-dextroamphetamine (ADDERALL XR) 15 MG 24 hr capsule, Take 1 capsule (15 mg total) by mouth every morning., Disp: 30 capsule, Rfl: 0 [START ON 12/03/2013] amphetamine-dextroamphetamine (ADDERALL XR) 15 MG 24 hr capsule, Take 1 capsule (15 mg total) by mouth every morning., Disp: 30 capsule, Rfl: 0;  buPROPion (WELLBUTRIN SR) 150 MG 12 hr tablet, Take 1 tablet (150 mg total) by mouth 2 (two) times daily., Disp: 180 tablet, Rfl: 1 estrogen-methylTESTOSTERone (EST ESTROGENS-METHYLTEST HS) 0.625-1.25 MG per tablet, TAKE 1 TABLET BY MOUTH EVERY NIGHT AT BEDTIME, Disp: 30 tablet, Rfl: 5;  gabapentin (NEURONTIN) 100 MG capsule, Take 2 capsules (200 mg total) by mouth at bedtime., Disp: 100 capsule, Rfl: 1;  zolpidem (AMBIEN) 10 MG tablet, Take 0.5 tablets (5 mg total) by mouth at  bedtime as needed for sleep., Disp: 30 tablet, Rfl: 0 diphenhydrAMINE (BENADRYL) 25 MG tablet, Take 25 mg by mouth at bedtime as needed for sleep., Disp: , Rfl:   No Known Allergies  ROS:  No fevers, chills, shortness of breath, headache, dizziness, chest pain, palpitations, shortness of breath.  Stool is softer than normal, no diarrhea.  No vomiting, +nausea.  Denies rashes, bleeding, bruising. See HPI  PHYSICAL EXAM: BP 140/90  Pulse 80  Temp(Src) 98.1 F (36.7 C) (Oral)  Ht 5\' 5"  (1.651 m)  Wt 164 lb (74.39 kg)  BMI 27.29 kg/m2 Well developed, pleasant female with hoarse voice, rare cough, in no distress HEENT:  PERRL,  EOMI, conjunctiva clear. TM's and EAC's normal.  Nasal mucosa moderately edematous with erythema and yellow-green mucus bilaterally.  Sinuses nontender. OP clear without erythema Neck: no lymphadenopathy or mass Heart: regular rate and rhythm without murmur Lungs: clear bilaterally, no wheezes, rales, ronchi Abdomen: soft, nontender, no mass Extremities: no edema Skin: no rash  ASSESSMENT/PLAN:  Acute upper respiratory infections of unspecified site  Allergic rhinitis, cause unspecified  Cervical radiculopathy - left sided--improving/resolved; okay to stop gabapentin   You likely have underlying seasonal allergies.  Use claritin once daily.   You seem to have a current viral illness.  I recommend adding Mucinex to loosen up the phlegm.  Consider trying sinus rinses (rinse kit or the Neti-pot). Use delsym syrup at bedtime if needed as a cough suppressant (vs getting the DM version of guaifenesin-containing medication).  Call the end of this week or early next week if you continue to have discolored sinus drainage, any fevers or other worsening symptoms.  I expect if this is viral that you will start feeling better by the end of this week, but may take up to another week to be fully better.  You likely have underlying allergies, and the clear congestion and sinus drainage may persist for longer.

## 2013-12-06 ENCOUNTER — Telehealth: Payer: Self-pay | Admitting: Internal Medicine

## 2013-12-06 ENCOUNTER — Other Ambulatory Visit: Payer: Self-pay | Admitting: Family Medicine

## 2013-12-06 ENCOUNTER — Other Ambulatory Visit: Payer: Self-pay

## 2013-12-06 NOTE — Telephone Encounter (Signed)
Pt needs a refill on ambien 10mg . Send to [facility]

## 2013-12-06 NOTE — Telephone Encounter (Signed)
IS THIS OKAY 

## 2013-12-06 NOTE — Telephone Encounter (Signed)
CALLED IN AMBIEN PER JCL 

## 2013-12-06 NOTE — Telephone Encounter (Signed)
Okay to renew

## 2013-12-26 ENCOUNTER — Telehealth: Payer: Self-pay | Admitting: Internal Medicine

## 2013-12-26 MED ORDER — AMPHETAMINE-DEXTROAMPHET ER 15 MG PO CP24: 15.0000 mg | ORAL_CAPSULE | ORAL | Status: DC

## 2013-12-26 NOTE — Telephone Encounter (Signed)
Pt needs a refill on adderall 15mg  . Call when ready

## 2014-02-02 ENCOUNTER — Other Ambulatory Visit: Payer: Self-pay | Admitting: Family Medicine

## 2014-02-02 ENCOUNTER — Telehealth: Payer: Self-pay | Admitting: Family Medicine

## 2014-02-02 NOTE — Telephone Encounter (Signed)
Tell her that I need to see her concerning her sleep issue.

## 2014-02-02 NOTE — Telephone Encounter (Signed)
LMTCB to schedule OV to discuss sleep medication

## 2014-02-02 NOTE — Telephone Encounter (Signed)
Left message on pt VM to CB

## 2014-02-02 NOTE — Telephone Encounter (Signed)
Is this okay to refill? 

## 2014-02-02 NOTE — Telephone Encounter (Signed)
Pt called and needs Ambien refill [facility]    Pt ph [phone]

## 2014-02-07 ENCOUNTER — Encounter: Payer: 59 | Admitting: Family Medicine

## 2014-02-16 ENCOUNTER — Ambulatory Visit (INDEPENDENT_AMBULATORY_CARE_PROVIDER_SITE_OTHER): Payer: 59 | Admitting: Family Medicine

## 2014-02-16 ENCOUNTER — Encounter: Payer: Self-pay | Admitting: Family Medicine

## 2014-02-16 VITALS — BP 120/76 | HR 80 | Wt 165.0 lb

## 2014-02-16 DIAGNOSIS — G479 Sleep disorder, unspecified: Secondary | ICD-10-CM

## 2014-02-16 DIAGNOSIS — N3010 Interstitial cystitis (chronic) without hematuria: Secondary | ICD-10-CM

## 2014-02-16 MED ORDER — ZOLPIDEM TARTRATE 10 MG PO TABS: ORAL_TABLET | ORAL | Status: DC

## 2014-02-16 NOTE — Progress Notes (Signed)
   Subjective:    Patient ID: [name] [name], female    DOB: Jul 22, 1966, 48 y.o.   MRN: [id number]  HPI She is here for consult concerning her sleep medication. She states she wakes up 2 or 3 times per night and in urinates. She stated in the past that she would use the medication when her husband is out of town because she has difficulty sleeping. She now states that it's related to her urinating at night. She does have a history of IC and apparently has tried various meds in the past and had unacceptable side effects mainly of dry mouth. Sometimes she will take Benadryl with her sleep. She also states that she has been seen by a sleep specialist in the past and told that there were apparently no major problems  Review of Systems     Objective:   Physical Exam Alert and in no distress otherwise not examined       Assessment & Plan:  IC (interstitial cystitis)  Sleep disturbance  I will continue her on her Ambien for the time being and explained to her that if the IC is keeping her awake that needs to be dealt with and once this is under control, if she still having sleep-related issues we will readdress that.

## 2014-02-16 NOTE — Addendum Note (Signed)
Addended by: Denita Lung on: 02/16/2014 01:27 PM   Modules accepted: Orders

## 2014-02-28 ENCOUNTER — Other Ambulatory Visit: Payer: Self-pay | Admitting: Family Medicine

## 2014-03-23 ENCOUNTER — Other Ambulatory Visit: Payer: Self-pay | Admitting: Family Medicine

## 2014-03-24 ENCOUNTER — Telehealth: Payer: Self-pay | Admitting: Family Medicine

## 2014-03-24 MED ORDER — AMPHETAMINE-DEXTROAMPHET ER 15 MG PO CP24: 15.0000 mg | ORAL_CAPSULE | ORAL | Status: DC

## 2014-03-24 NOTE — Telephone Encounter (Signed)
Adderall renewed.

## 2014-03-27 ENCOUNTER — Telehealth: Payer: Self-pay | Admitting: Family Medicine

## 2014-03-27 NOTE — Telephone Encounter (Signed)
Advised pt Rx refill ready to pick up

## 2014-05-10 ENCOUNTER — Telehealth: Payer: Self-pay | Admitting: Family Medicine

## 2014-05-10 NOTE — Telephone Encounter (Signed)
Okay to renew

## 2014-05-10 NOTE — Telephone Encounter (Signed)
Medication called in 

## 2014-06-04 ENCOUNTER — Ambulatory Visit (INDEPENDENT_AMBULATORY_CARE_PROVIDER_SITE_OTHER): Payer: 59 | Admitting: Family Medicine

## 2014-06-04 VITALS — BP 128/80 | HR 64 | Temp 98.9°F | Resp 18 | Ht 64.75 in | Wt 165.5 lb

## 2014-06-04 DIAGNOSIS — H9191 Unspecified hearing loss, right ear: Secondary | ICD-10-CM

## 2014-06-04 DIAGNOSIS — H6120 Impacted cerumen, unspecified ear: Secondary | ICD-10-CM

## 2014-06-04 DIAGNOSIS — H9190 Unspecified hearing loss, unspecified ear: Secondary | ICD-10-CM

## 2014-06-04 DIAGNOSIS — H6121 Impacted cerumen, right ear: Secondary | ICD-10-CM
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
## 2014-06-04 NOTE — Patient Instructions (Signed)
Carbamide Peroxide ear solution What is this medicine? CARBAMIDE PEROXIDE (CAR bah mide per OX ide) is used to soften and help remove ear wax. This medicine may be used for other purposes; ask your health care provider or pharmacist if you have questions. COMMON BRAND NAME(S): Auro Ear, Auro Earache Relief, Debrox, Ear Drops, Ear Wax Removal, Ear Wax Remover, Earwax Treatment, Murine, Thera-Ear What should I tell my health care provider before I take this medicine? They need to know if you have any of these conditions: -dizziness -ear discharge -ear pain, irritation or rash -infection -perforated eardrum (hole in eardrum) -an unusual or allergic reaction to carbamide peroxide, glycerin, hydrogen peroxide, other medicines, foods, dyes, or preservatives -pregnant or trying to get pregnant -breast-feeding How should I use this medicine? This medicine is only for use in the outer ear canal. Follow the directions carefully. Wash hands before and after use. The solution may be warmed by holding the bottle in the hand for 1 to 2 minutes. Lie with the affected ear facing upward. Place the proper number of drops into the ear canal. After the drops are instilled, remain lying with the affected ear upward for 5 minutes to help the drops stay in the ear canal. A cotton ball may be gently inserted at the ear opening for no longer than 5 to 10 minutes to ensure retention. Repeat, if necessary, for the opposite ear. Do not touch the tip of the dropper to the ear, fingertips, or other surface. Do not rinse the dropper after use. Keep container tightly closed. Talk to your pediatrician regarding the use of this medicine in children. While this drug may be used in children as young as 12 years for selected conditions, precautions do apply. Overdosage: If you think you have taken too much of this medicine contact a poison control center or emergency room at once. NOTE: This medicine is only for you. Do not share  this medicine with others. What if I miss a dose? If you miss a dose, use it as soon as you can. If it is almost time for your next dose, use only that dose. Do not use double or extra doses. What may interact with this medicine? Interactions are not expected. Do not use any other ear products without asking your doctor or health care professional. This list may not describe all possible interactions. Give your health care provider a list of all the medicines, herbs, non-prescription drugs, or dietary supplements you use. Also tell them if you smoke, drink alcohol, or use illegal drugs. Some items may interact with your medicine. What should I watch for while using this medicine? This medicine is not for long-term use. Do not use for more than 4 days without checking with your health care professional. Contact your doctor or health care professional if your condition does not start to get better within a few days or if you notice burning, redness, itching or swelling. What side effects may I notice from receiving this medicine? Side effects that you should report to your doctor or health care professional as soon as possible: -allergic reactions like skin rash, itching or hives, swelling of the face, lips, or tongue -burning, itching, and redness -worsening ear pain -rash Side effects that usually do not require medical attention (report to your doctor or health care professional if they continue or are bothersome): -abnormal sensation while putting the drops in the ear -temporary reduction in hearing (but not complete loss of hearing) This list may not  describe all possible side effects. Call your doctor for medical advice about side effects. You may report side effects to FDA at 1-800-FDA-1088. Where should I keep my medicine? Keep out of the reach of children. Store at room temperature between 15 and 30 degrees C (59 and 86 degrees F) in a tight, light-resistant container. Keep bottle away  from excessive heat and direct sunlight. Throw away any unused medicine after the expiration date. NOTE: This sheet is a summary. It may not cover all possible information. If you have questions about this medicine, talk to your doctor, pharmacist, or health care provider.  2015, Elsevier/Gold Standard. (2008-03-14 14:00:02) Cerumen Impaction A cerumen impaction is when the wax in your ear forms a plug. This plug usually causes reduced hearing. Sometimes it also causes an earache or dizziness. Removing a cerumen impaction can be difficult and painful. The wax sticks to the ear canal. The canal is sensitive and bleeds easily. If you try to remove a heavy wax buildup with a cotton tipped swab, you may push it in further. Irrigation with water, suction, and small ear curettes may be used to clear out the wax. If the impaction is fixed to the skin in the ear canal, ear drops may be needed for a few days to loosen the wax. People who build up a lot of wax frequently can use ear wax removal products available in your local drugstore. SEEK MEDICAL CARE IF:  You develop an earache, increased hearing loss, or marked dizziness. Document Released: 01/08/2005 Document Revised: 02/23/2012 Document Reviewed: 02/28/2010 [facility] Patient Information 2015 [place], [place]. This information is not intended to replace advice given to you by your health care provider. Make sure you discuss any questions you have with your health care provider.

## 2014-06-20 ENCOUNTER — Telehealth: Payer: Self-pay | Admitting: Internal Medicine

## 2014-06-20 NOTE — Telephone Encounter (Signed)
Pt set up an appt for July 14, she only has 2 days left on her hormone. Can you refill it for 30 days so she wont run until she can see you on tuesday

## 2014-06-20 NOTE — Telephone Encounter (Signed)
Call pt left message dr.lalonde wants her to make appointment to come in and discuss this and add

## 2014-06-20 NOTE — Telephone Encounter (Signed)
Have her come in so we can discuss this and her ADD. It has been a while since we have discussed all this.

## 2014-06-20 NOTE — Telephone Encounter (Signed)
Refill request for estrogen-methyltestose #30 to [facility]

## 2014-06-21 MED ORDER — EST ESTROGENS-METHYLTEST 0.625-1.25 MG PO TABS: ORAL_TABLET | ORAL | Status: DC

## 2014-06-21 NOTE — Telephone Encounter (Signed)
Pt has an appt next Thursday but is out of her med tomorrow. I have called in a 30 day just to get her through until next week appt

## 2014-06-27 ENCOUNTER — Encounter: Payer: 59 | Admitting: Family Medicine

## 2014-06-29 ENCOUNTER — Encounter: Payer: 59 | Admitting: Family Medicine

## 2014-07-04 ENCOUNTER — Telehealth: Payer: Self-pay | Admitting: Family Medicine

## 2014-07-04 ENCOUNTER — Other Ambulatory Visit: Payer: Self-pay | Admitting: Medical

## 2014-07-04 MED ORDER — AMPHETAMINE-DEXTROAMPHET ER 15 MG PO CP24: 15.0000 mg | ORAL_CAPSULE | ORAL | Status: DC

## 2014-07-04 NOTE — Telephone Encounter (Signed)
rx ready 

## 2014-07-04 NOTE — Telephone Encounter (Signed)
Pt called and has a med check appt on 07/24/14 and she needs a refill on her Adderall XR 15 mg.  Please call her on [phone] and leave her a message when ready today.

## 2014-07-11 ENCOUNTER — Other Ambulatory Visit: Payer: Self-pay

## 2014-07-11 ENCOUNTER — Telehealth: Payer: Self-pay | Admitting: Internal Medicine

## 2014-07-11 MED ORDER — ZOLPIDEM TARTRATE 10 MG PO TABS: ORAL_TABLET | ORAL | Status: DC

## 2014-07-11 NOTE — Telephone Encounter (Signed)
Renew the Ambien

## 2014-07-11 NOTE — Telephone Encounter (Signed)
Pt needs a refill on ambien to [facility]. She has a med check coming up

## 2014-07-11 NOTE — Telephone Encounter (Signed)
done

## 2014-07-19 ENCOUNTER — Telehealth: Payer: Self-pay | Admitting: Family Medicine

## 2014-07-19 MED ORDER — EST ESTROGENS-METHYLTEST 0.625-1.25 MG PO TABS: ORAL_TABLET | ORAL | Status: DC

## 2014-07-19 NOTE — Telephone Encounter (Signed)
Med renewed

## 2014-07-20 ENCOUNTER — Other Ambulatory Visit: Payer: Self-pay

## 2014-07-20 MED ORDER — EST ESTROGENS-METHYLTEST 0.625-1.25 MG PO TABS: ORAL_TABLET | ORAL | Status: DC

## 2014-07-20 NOTE — Telephone Encounter (Signed)
CALLED IN MED HORMONE MED

## 2014-07-24 ENCOUNTER — Encounter: Payer: Self-pay | Admitting: Family Medicine

## 2014-07-24 ENCOUNTER — Ambulatory Visit (INDEPENDENT_AMBULATORY_CARE_PROVIDER_SITE_OTHER): Payer: 59 | Admitting: Family Medicine

## 2014-07-24 VITALS — BP 134/90 | HR 73 | Wt 168.0 lb

## 2014-07-24 DIAGNOSIS — F988 Other specified behavioral and emotional disorders with onset usually occurring in childhood and adolescence: Secondary | ICD-10-CM

## 2014-07-24 DIAGNOSIS — Z79890 Hormone replacement therapy (postmenopausal): Secondary | ICD-10-CM

## 2014-07-24 DIAGNOSIS — G479 Sleep disorder, unspecified: Secondary | ICD-10-CM

## 2014-07-24 DIAGNOSIS — F341 Dysthymic disorder: Secondary | ICD-10-CM

## 2014-07-24 MED ORDER — AMPHETAMINE-DEXTROAMPHET ER 15 MG PO CP24: 15.0000 mg | ORAL_CAPSULE | ORAL | Status: DC

## 2014-07-24 MED ORDER — EST ESTROGENS-METHYLTEST 0.625-1.25 MG PO TABS: ORAL_TABLET | ORAL | Status: DC

## 2014-07-24 MED ORDER — BUPROPION HCL ER (SR) 150 MG PO TB12: ORAL_TABLET | ORAL | Status: DC

## 2014-07-24 NOTE — Patient Instructions (Signed)
Insomnia Insomnia is frequent trouble falling and/or staying asleep. Insomnia can be a long term problem or a short term problem. Both are common. Insomnia can be a short term problem when the wakefulness is related to a certain stress or worry. Long term insomnia is often related to ongoing stress during waking hours and/or poor sleeping habits. Overtime, sleep deprivation itself can make the problem worse. Every little thing feels more severe because you are overtired and your ability to cope is decreased. CAUSES   Stress, anxiety, and depression.  Poor sleeping habits.  Distractions such as TV in the bedroom.  Naps close to bedtime.  Engaging in emotionally charged conversations before bed.  Technical reading before sleep.  Alcohol and other sedatives. They may make the problem worse. They can hurt normal sleep patterns and normal dream activity.  Stimulants such as caffeine for several hours prior to bedtime.  Pain syndromes and shortness of breath can cause insomnia.  Exercise late at night.  Changing time zones may cause sleeping problems (jet lag). It is sometimes helpful to have someone observe your sleeping patterns. They should look for periods of not breathing during the night (sleep apnea). They should also look to see how long those periods last. If you live alone or observers are uncertain, you can also be observed at a sleep clinic where your sleep patterns will be professionally monitored. Sleep apnea requires a checkup and treatment. Give your caregivers your medical history. Give your caregivers observations your family has made about your sleep.  SYMPTOMS   Not feeling rested in the morning.  Anxiety and restlessness at bedtime.  Difficulty falling and staying asleep. TREATMENT   Your caregiver may prescribe treatment for an underlying medical disorders. Your caregiver can give advice or help if you are using alcohol or other drugs for self-medication. Treatment  of underlying problems will usually eliminate insomnia problems.  Medications can be prescribed for short time use. They are generally not recommended for lengthy use.  Over-the-counter sleep medicines are not recommended for lengthy use. They can be habit forming.  You can promote easier sleeping by making lifestyle changes such as:  Using relaxation techniques that help with breathing and reduce muscle tension.  Exercising earlier in the day.  Changing your diet and the time of your last meal. No night time snacks.  Establish a regular time to go to bed.  Counseling can help with stressful problems and worry.  Soothing music and white noise may be helpful if there are background noises you cannot remove.  Stop tedious detailed work at least one hour before bedtime. HOME CARE INSTRUCTIONS   Keep a diary. Inform your caregiver about your progress. This includes any medication side effects. See your caregiver regularly. Take note of:  Times when you are asleep.  Times when you are awake during the night.  The quality of your sleep.  How you feel the next day. This information will help your caregiver care for you.  Get out of bed if you are still awake after 15 minutes. Read or do some quiet activity. Keep the lights down. Wait until you feel sleepy and go back to bed.  Keep regular sleeping and waking hours. Avoid naps.  Exercise regularly.  Avoid distractions at bedtime. Distractions include watching television or engaging in any intense or detailed activity like attempting to balance the household checkbook.  Develop a bedtime ritual. Keep a familiar routine of bathing, brushing your teeth, climbing into bed at the same   time each night, listening to soothing music. Routines increase the success of falling to sleep faster.  Use relaxation techniques. This can be using breathing and muscle tension release routines. It can also include visualizing peaceful scenes. You can  also help control troubling or intruding thoughts by keeping your mind occupied with boring or repetitive thoughts like the old concept of counting sheep. You can make it more creative like imagining planting one beautiful flower after another in your backyard garden.  During your day, work to eliminate stress. When this is not possible use some of the previous suggestions to help reduce the anxiety that accompanies stressful situations. MAKE SURE YOU:   Understand these instructions.  Will watch your condition.  Will get help right away if you are not doing well or get worse. Document Released: 11/28/2000 Document Revised: 02/23/2012 Document Reviewed: 12/29/2007 ExitCare Patient Information 2015 [employer]. This information is not intended to replace advice given to you by your health care provider. Make sure you discuss any questions you have with your health care provider.  

## 2014-07-24 NOTE — Progress Notes (Signed)
   Subjective:    Patient ID: [name] [name], female    DOB: 1966/08/10, 48 y.o.   MRN: [id number]  HPI She is here for medication check. She continues to do quite nicely on her Adderall. She gets about 8 hours worth of benefit out of it. She has no withdrawal symptoms from this. She continues on her estrogen/testosterone and has had a couple breakthroughs with hot flashes. She has been on this since she had a hysterectomy. She continues to do well on Wellbutrin. She continues to use Ambien one half pill at night. She has been evaluated by neurology. A sleep study was recommended however the cost made this difficult for her.   Review of Systems     Objective:   Physical Exam  alert and in no distress otherwise not examined       Assessment & Plan:  Sleep disturbance  Dysthymia - Plan: buPROPion (WELLBUTRIN SR) 150 MG 12 hr tablet  ADD (attention deficit disorder) - Plan: amphetamine-dextroamphetamine (ADDERALL XR) 15 MG 24 hr capsule, amphetamine-dextroamphetamine (ADDERALL XR) 15 MG 24 hr capsule, amphetamine-dextroamphetamine (ADDERALL XR) 15 MG 24 hr capsule  Hormone replacement therapy (HRT) - Plan: estrogen-methylTESTOSTERone (EST ESTROGENS-METHYLTEST HS) 0.625-1.25 MG per tablet  I again had a discussion with her concerning sleep. She will check with her insurance to see if she can get to sleep study done for more reasonable cost. Explained that I am not interested in keeping her on the sleep medicine for ever. Discussed the use of HRT and we'll readdress this in several years.

## 2014-08-22 ENCOUNTER — Encounter: Payer: 59 | Admitting: Family Medicine

## 2014-08-22 ENCOUNTER — Ambulatory Visit (INDEPENDENT_AMBULATORY_CARE_PROVIDER_SITE_OTHER): Payer: 59 | Admitting: Family Medicine

## 2014-08-22 VITALS — BP 138/90 | HR 78 | Temp 98.0°F | Resp 20 | Ht 64.0 in | Wt 170.0 lb

## 2014-08-22 DIAGNOSIS — Z90710 Acquired absence of both cervix and uterus: Secondary | ICD-10-CM

## 2014-08-22 DIAGNOSIS — F411 Generalized anxiety disorder: Secondary | ICD-10-CM

## 2014-08-22 DIAGNOSIS — Z0000 Encounter for general adult medical examination without abnormal findings: Secondary | ICD-10-CM

## 2014-08-22 DIAGNOSIS — Z79890 Hormone replacement therapy (postmenopausal): Secondary | ICD-10-CM

## 2014-08-22 DIAGNOSIS — F909 Attention-deficit hyperactivity disorder, unspecified type: Secondary | ICD-10-CM

## 2014-08-22 LAB — POCT CBC
Granulocyte percent: 45.7 %G (ref 37–80)
HCT, POC: 40.9 % (ref 37.7–47.9)
Hemoglobin: 13.6 g/dL (ref 12.2–16.2)
Lymph, poc: 3.3 (ref 0.6–3.4)
MCH, POC: 31.2 pg (ref 27–31.2)
MCHC: 33.2 g/dL (ref 31.8–35.4)
MCV: 93.8 fL (ref 80–97)
MID (cbc): 0.7 (ref 0–0.9)
MPV: 6.4 fL (ref 0–99.8)
POC Granulocyte: 3.4 (ref 2–6.9)
POC LYMPH PERCENT: 45 %L (ref 10–50)
POC MID %: 9.3 %M (ref 0–12)
Platelet Count, POC: 237 10*3/uL (ref 142–424)
RBC: 4.36 M/uL (ref 4.04–5.48)
RDW, POC: 12.5 %
WBC: 7.4 10*3/uL (ref 4.6–10.2)

## 2014-08-22 MED ORDER — EST ESTROGENS-METHYLTEST 0.625-1.25 MG PO TABS: ORAL_TABLET | ORAL | Status: DC

## 2014-08-22 NOTE — Progress Notes (Signed)
Chief Complaint:  Chief Complaint  Patient presents with  . Annual Exam    wanted to change doctors--Dr. Marin Comment    HPI: [name] [name] is a 47 y.o. female who is here to establish care, she also wants to get her annual exam with labs  but she has already eaten She has had a complete abdominla hysterectomy at age 53 y/o, for fibroids/endometriosis, she does not have an ob/gyn. Has not seen one for a long time since she ahd the hsyterectomy. Last pap was negative in 2010 . Last mammo 2 yeara ago was normal. Last year hemoccult cards were negative. No CP, SOB, abd pain, vaginal sxs currently.   She has a twitching and was told it may be the combination of the aderall for her ADD and wellbutrin She noticed it when she started taking the wellbutrin for  Anxiety. She deos not want to address this now sicne her anxiety is well controlled with the Wellbutrin, she tolerates the occ twitches ok.   She has concerns about being on longterm HRT since her abd hysterectomy.   She has insomnia, Dr Redmond School wants her to be tapered off if possible and get a sleep study but she thinks it is more realted to her getting up due to her interstitial cystitis. Urologist?    PATH REPORT FOR HYSTERECTOMY.   DOB/Age [birth year] (Age: 47) Gender: F Date Taken: 05/19/2007 Date Received: 05/19/2007  FINAL DIAGNOSIS  MICROSCOPIC EXAMINATION AND DIAGNOSIS  UTERUS, HYSTERECTOMY: - BENIGN CERVIX WITH NO SIGNIFICANT ABNORMALITIES - BENIGN INTERVAL PHASE ENDOMETRIUM. - ADENOMYOSIS  BILATERAL OVARIES AND FALLOPIAN TUBES, RESECTION: - BENIGN FOLLICULAR CYSTS. - BENIGN UNREMARKABLE FALLOPIAN TUBES.  gdt Date Reported: 05/20/2007 Violet Baldy, MD  Electronically Signed Out By BNS   Past Medical History  Diagnosis Date  . Depression   . Interstitial cystitis   . Cervical radiculopathy 2014    L arm (into L4th and 5th fingers)  . Anxiety    Past Surgical History  Procedure Laterality Date  . Abdominal  hysterectomy    . Knee surgery      both knees  . Breast reduction surgery      age 77yo  . Bladder surgery      due to interstitial cystitis   History   Social History  . Marital Status: Married    Spouse Name: N/A    Number of Children: N/A  . Years of Education: N/A   Social History Main Topics  . Smoking status: Former Smoker    Quit date: 08/04/1990  . Smokeless tobacco: Never Used  . Alcohol Use: Yes     Comment: rare  . Drug Use: No  . Sexual Activity: Yes    Birth Control/ Protection: Surgical   Other Topics Concern  . Not on file   Social History Narrative  . No narrative on file   Family History  Problem Relation Age of Onset  . Stroke Brother   . Diabetes Brother   . Hyperlipidemia Brother   . Heart disease Father   . Stroke Father   . Mental illness Father   . Mental illness Sister    No Known Allergies Prior to Admission medications   Medication Sig Start Date End Date Taking? Authorizing Provider  amphetamine-dextroamphetamine (ADDERALL XR) 15 MG 24 hr capsule Take 1 capsule (15 mg total) by mouth every morning. 10/03/14  Yes Denita Lung, MD  amphetamine-dextroamphetamine (ADDERALL XR) 15 MG 24 hr capsule Take  1 capsule (15 mg total) by mouth every morning. 09/03/14  Yes Denita Lung, MD  amphetamine-dextroamphetamine (ADDERALL XR) 15 MG 24 hr capsule Take 1 capsule (15 mg total) by mouth every morning. 08/03/14  Yes Denita Lung, MD  buPROPion [facility] SR) 150 MG 12 hr tablet TAKE 1 TABLET BY MOUTH TWICE DAILY 07/24/14  Yes Denita Lung, MD  estrogen-methylTESTOSTERone (EST ESTROGENS-METHYLTEST HS) 0.625-1.25 MG per tablet TAKE 1 TABLET BY MOUTH ONCE DAILY AT BEDTIME 07/24/14  Yes Denita Lung, MD  zolpidem (AMBIEN) 10 MG tablet TAKE 1/2 TABLET BY MOUTH AT BEDTIME 07/11/14  Yes Denita Lung, MD     ROS: The patient denies fevers, chills, night sweats, unintentional weight loss, chest pain, palpitations, wheezing, dyspnea on exertion,  nausea, vomiting, abdominal pain, dysuria, hematuria, melena, numbness, weakness, or tingling.   All other systems have been reviewed and were otherwise negative with the exception of those mentioned in the HPI and as above.    PHYSICAL EXAM: Filed Vitals:   08/22/14 2126  BP: 138/90  Pulse: 78  Temp: 98 F (36.7 C)  Resp: 20   Filed Vitals:   08/22/14 2126  Height: 5\' 4"  (1.626 m)  Weight: 170 lb (77.111 kg)   Body mass index is 29.17 kg/(m^2).  General: Alert, no acute distress HEENT:  Normocephalic, atraumatic, oropharynx patent. EOMI, PERRLA, tm nl, fundoscopic examnl Cardiovascular:  Regular rate and rhythm, no rubs murmurs or gallops.  No Carotid bruits, radial pulse intact. No pedal edema.  Respiratory: Clear to auscultation bilaterally.  No wheezes, rales, or rhonchi.  No cyanosis, no use of accessory musculature GI: No organomegaly, abdomen is soft and non-tender, positive bowel sounds.  No masses. Skin: No rashes. Neurologic: Facial musculature symmetric. Psychiatric: Patient is appropriate throughout our interaction. Lymphatic: No cervical lymphadenopathy Musculoskeletal: Gait intact. 5/5 strength, sensation intact. She has tenderness at right priformis msk Breast exam normal except for well healed surgical wound from breast reduction   LABS: Results for orders placed in visit on 08/23/13  HEMOCCULT GUIAC POC 1CARD (OFFICE)      Result Value Ref Range   Fecal Occult Blood, POC Negative     Card #1 Date 08/17/2013     Card #2 Fecal Occult Blod, POC Negative     Card #2 Date 08/19/2013     Card #3 Fecal Occult Blood, POC Negative     Card #3 Date 08/21/2013       EKG/XRAY:   Primary read interpreted by Dr. Marin Comment at [facility].   ASSESSMENT/PLAN: Encounter Diagnoses  Name Primary?  . Annual physical exam Yes  . GAD (generalized anxiety disorder)   . Attention deficit hyperactivity disorder (ADHD), unspecified ADHD type   . History of hysterectomy for benign  disease   . Hormone replacement therapy (HRT)    48 year old female here for annual exam and to establish care. She works for [employer] in [employer].  She has already eaten  1. GAD-on wellbutrin,makes her slightly tremulous with her Adderall but she cannot tolerate lexapro or other SSRIs she thinks because she wants to have libido and the lexapro "zapped her sex drive". She does not want to change this yet.  2. ADHD-still has refills on those from Dr Redmond School, doing well on medicines, she will ask for refills when she runs out of the ones she has from Dr Redmond School. May refill for 3 months in future as long as [place] rx profile is consistent. NO SI/HI/hallucinations  or abuse issues.  3. HRT/hx of benign complete hysterectomy age 60-wants to know is she should be on estrogen/testosterone combo indefinitely or can she wean off. She does not know if she has pre-menopause sxs but she is always hot, she has difficulty sleeping and is taking [place]. We had a discussion about risk and benefits. She is worried about risks but does not want to have worsening sxs if she goes off of meds. There is no family hx of breast cancer but there is a strong FHx of CVA and MIs. Will d/w her on next appt in 1 montha fter d.w ob.gyn to see if there are nay other risks and benefits besides what we already discussed in office.  4. Right buttock pain radiating down leg-? Bursitis vs arthitis vs sciatica. NKI, has not ried anything for this. She will tyelnol pm or ale4ve pm prn to help with pain and with sleeping prn.  5. Insomnia-sleep study is scheduled but she feels she does not need it since she gets up because she has intersistial cystitis to go urinate/bladder irritation and the medicine she was on before for it by urology made her dry so she stopped it. 6. Labs pending, schedule yearly mammogram  F/u in 1 month   Gross sideeffects, risk and benefits, and alternatives of medications d/w patient. Patient is aware that all  medications have potential sideeffects and we are unable to predict every sideeffect or drug-drug interaction that may occur.  LE, [place], DO 08/22/2014 10:23 PM

## 2014-08-23 ENCOUNTER — Encounter: Payer: Self-pay | Admitting: Family Medicine

## 2014-08-23 ENCOUNTER — Encounter: Payer: 59 | Admitting: Family Medicine

## 2014-08-23 LAB — COMPREHENSIVE METABOLIC PANEL
ALT: 31 U/L (ref 0–35)
AST: 21 U/L (ref 0–37)
Albumin: 4.3 g/dL (ref 3.5–5.2)
Alkaline Phosphatase: 56 U/L (ref 39–117)
BUN: 14 mg/dL (ref 6–23)
CO2: 29 mEq/L (ref 19–32)
Calcium: 9.4 mg/dL (ref 8.4–10.5)
Chloride: 104 mEq/L (ref 96–112)
Creat: 0.95 mg/dL (ref 0.50–1.10)
Glucose, Bld: 100 mg/dL — ABNORMAL HIGH (ref 70–99)
Potassium: 4 mEq/L (ref 3.5–5.3)
Sodium: 140 mEq/L (ref 135–145)
Total Bilirubin: 0.3 mg/dL (ref 0.2–1.2)
Total Protein: 6.5 g/dL (ref 6.0–8.3)

## 2014-08-23 LAB — LDL CHOLESTEROL, DIRECT: Direct LDL: 103 mg/dL — ABNORMAL HIGH

## 2014-09-01 ENCOUNTER — Telehealth: Payer: Self-pay | Admitting: Family Medicine

## 2014-09-01 NOTE — Telephone Encounter (Signed)
Pharmacy called and stated rx for adderall states do not fill until 8/20.  Pharmacy is closed the 19 and 20 so they are asking to fill early. Discussed with Minette Headland CMA and she ok'd and I agree with decision.

## 2014-09-13 ENCOUNTER — Other Ambulatory Visit: Payer: Self-pay | Admitting: Family Medicine

## 2014-09-13 NOTE — Telephone Encounter (Signed)
IS THIS OKAY 

## 2014-09-14 ENCOUNTER — Telehealth: Payer: Self-pay

## 2014-09-14 NOTE — Telephone Encounter (Signed)
Go ahead and refill x 2 months. She needs to consider that sleep study that Dr Redmond School had ordered . I know she states it is interstitial cystitis and she has frequent night time awakenings  but she should just rule it out just in case. If she is willing to try it I would be more comfortable giving her more refills otherwise we are  Just  Masking her sxs with medicines.

## 2014-09-14 NOTE — Telephone Encounter (Signed)
It looks like she has switched her care to another physician. Verify this and if so she'll need to call the other doctor

## 2014-09-14 NOTE — Telephone Encounter (Signed)
i have called pt left message to please call us back and let us know if she had switched to a new dr.

## 2014-09-14 NOTE — Telephone Encounter (Signed)
Pt is requesting a refill of ambien please!

## 2014-09-15 MED ORDER — ZOLPIDEM TARTRATE 10 MG PO TABS: ORAL_TABLET | ORAL | Status: DC

## 2014-09-15 NOTE — Telephone Encounter (Signed)
Is this okay to refill? 

## 2014-09-15 NOTE — Telephone Encounter (Signed)
Per Dr. Marin Comment:  Meds ordered this encounter  Medications  . zolpidem (AMBIEN) 10 MG tablet    Sig: TAKE 1/2 TABLET BY MOUTH AT BEDTIME    Dispense:  30 tablet    Refill:  0    Order Specific Question:  Supervising Provider    Answer:  DOOLITTLE, ROBERT P [[id number]]

## 2014-09-15 NOTE — Telephone Encounter (Signed)
Rx called into pharmacy. Pt notified.

## 2014-09-17 NOTE — Telephone Encounter (Signed)
I assume that she has switched to a different doc

## 2014-09-29 ENCOUNTER — Ambulatory Visit (INDEPENDENT_AMBULATORY_CARE_PROVIDER_SITE_OTHER): Payer: 59

## 2014-09-29 ENCOUNTER — Encounter: Payer: Self-pay | Admitting: Family Medicine

## 2014-09-29 ENCOUNTER — Encounter: Payer: 59 | Admitting: Family Medicine

## 2014-09-29 ENCOUNTER — Ambulatory Visit (INDEPENDENT_AMBULATORY_CARE_PROVIDER_SITE_OTHER): Payer: 59 | Admitting: Family Medicine

## 2014-09-29 VITALS — BP 128/84 | HR 70 | Temp 97.8°F | Resp 16 | Ht 64.5 in | Wt 171.2 lb

## 2014-09-29 DIAGNOSIS — M25571 Pain in right ankle and joints of right foot: Secondary | ICD-10-CM

## 2014-09-29 DIAGNOSIS — M79672 Pain in left foot: Secondary | ICD-10-CM

## 2014-09-29 DIAGNOSIS — S93421A Sprain of deltoid ligament of right ankle, initial encounter: Secondary | ICD-10-CM

## 2014-09-29 DIAGNOSIS — Z79890 Hormone replacement therapy (postmenopausal): Secondary | ICD-10-CM

## 2014-09-29 NOTE — Progress Notes (Signed)
Chief Complaint:  Chief Complaint  Patient presents with  . Follow-up    after physical, med check    HPI: [name] [name] is a 48 y.o. female who is here for: 1. Ankle pain on right side twisted it while walking her dog , and she has been exercising on it.  This occurred After walking her  dog 3 weeks ago, hurts more than left side foot.  2. Left side foot pain she heard a ligament pop. She state she was at work out class when she did this. She has 3/10 pain. She has been able to ambulate.  Has done RICE 3. She thinksshe wants to stay on the hormones, she may be perimenopausal so may help. We went through the risk and benefits again.   Past Medical History  Diagnosis Date  . Depression   . Interstitial cystitis   . Cervical radiculopathy 2014    L arm (into L4th and 5th fingers)  . Anxiety    Past Surgical History  Procedure Laterality Date  . Knee surgery      both knees  . Breast reduction surgery      age 62yo  . Bladder surgery      due to interstitial cystitis  . Abdominal hysterectomy      complete, benign pathology   History   Social History  . Marital Status: Married    Spouse Name: N/A    Number of Children: N/A  . Years of Education: N/A   Social History Main Topics  . Smoking status: Former Smoker    Quit date: 08/04/1990  . Smokeless tobacco: Never Used  . Alcohol Use: Yes     Comment: rare  . Drug Use: No  . Sexual Activity: Yes    Birth Control/ Protection: Surgical   Other Topics Concern  . None   Social History Narrative  . None   Family History  Problem Relation Age of Onset  . Stroke Brother   . Diabetes Brother   . Hyperlipidemia Brother   . Heart disease Father   . Stroke Father   . Mental illness Father   . Mental illness Sister    No Known Allergies Prior to Admission medications   Medication Sig Start Date End Date Taking? Authorizing Provider  amphetamine-dextroamphetamine (ADDERALL XR) 15 MG 24 hr capsule Take 1  capsule (15 mg total) by mouth every morning. 08/03/14  Yes Denita Lung, MD  buPROPion [facility] SR) 150 MG 12 hr tablet TAKE 1 TABLET BY MOUTH TWICE DAILY 07/24/14  Yes Denita Lung, MD  estrogen-methylTESTOSTERone (EST ESTROGENS-METHYLTEST HS) 0.625-1.25 MG per tablet TAKE 1 TABLET BY MOUTH ONCE DAILY AT BEDTIME 08/22/14  Yes Haila Dena P Sota Hetz, DO  zolpidem (AMBIEN) 10 MG tablet TAKE 1/2 TABLET BY MOUTH AT BEDTIME 09/15/14  Yes Chelle S Jeffery, PA-C  amphetamine-dextroamphetamine (ADDERALL XR) 15 MG 24 hr capsule Take 1 capsule (15 mg total) by mouth every morning. 10/03/14   Denita Lung, MD  amphetamine-dextroamphetamine (ADDERALL XR) 15 MG 24 hr capsule Take 1 capsule (15 mg total) by mouth every morning. 09/03/14   Denita Lung, MD     ROS: The patient denies fevers, chills, night sweats, unintentional weight loss, chest pain, palpitations, wheezing, dyspnea on exertion, nausea, vomiting, abdominal pain, dysuria, hematuria, melena, numbness, weakness, or tingling.  All other systems have been reviewed and were otherwise negative with the exception of those mentioned in the HPI and as above.  PHYSICAL EXAM: Filed Vitals:   09/29/14 0836  BP: 128/84  Pulse: 70  Temp: 97.8 F (36.6 C)  Resp: 16   Filed Vitals:   09/29/14 0836  Height: 5' 4.5" (1.638 m)  Weight: 171 lb 3.2 oz (77.656 kg)   Body mass index is 28.94 kg/(m^2).  General: Alert, no acute distress HEENT:  Normocephalic, atraumatic, oropharynx patent. EOMI, PERRLA Cardiovascular:  Regular rate and rhythm, no rubs murmurs or gallops.    Respiratory: Clear to auscultation bilaterally.  No wheezes, rales, or rhonchi.  No cyanosis, no use of accessory musculature GI: No organomegaly, abdomen is soft and non-tender, positive bowel sounds.  No masses. Skin: No rashes. Neurologic: Facial musculature symmetric. Psychiatric: Patient is appropriate throughout our interaction. Lymphatic: No cervical  lymphadenopathy Musculoskeletal: Gait intact. Right ankle-+ swelling, tender at lateral malleolus, + DP, full ROM, 5/5 strength sensataionintact LEft foot-+ swelling, tender at cidniforma nd navicular + DP, full ROM, 5/5 strength sensataion intact  LABS: Results for orders placed in visit on 08/22/14  COMPREHENSIVE METABOLIC PANEL      Result Value Ref Range   Sodium 140  135 - 145 mEq/L   Potassium 4.0  3.5 - 5.3 mEq/L   Chloride 104  96 - 112 mEq/L   CO2 29  19 - 32 mEq/L   Glucose, Bld 100 (*) 70 - 99 mg/dL   BUN 14  6 - 23 mg/dL   Creat 0.95  0.50 - 1.10 mg/dL   Total Bilirubin 0.3  0.2 - 1.2 mg/dL   Alkaline Phosphatase 56  39 - 117 U/L   AST 21  0 - 37 U/L   ALT 31  0 - 35 U/L   Total Protein 6.5  6.0 - 8.3 g/dL   Albumin 4.3  3.5 - 5.2 g/dL   Calcium 9.4  8.4 - 10.5 mg/dL  LDL CHOLESTEROL, DIRECT      Result Value Ref Range   Direct LDL 103 (*)   POCT CBC      Result Value Ref Range   WBC 7.4  4.6 - 10.2 K/uL   Lymph, poc 3.3  0.6 - 3.4   POC LYMPH PERCENT 45.0  10 - 50 %L   MID (cbc) 0.7  0 - 0.9   POC MID % 9.3  0 - 12 %M   POC Granulocyte 3.4  2 - 6.9   Granulocyte percent 45.7  37 - 80 %G   RBC 4.36  4.04 - 5.48 M/uL   Hemoglobin 13.6  12.2 - 16.2 g/dL   HCT, POC 40.9  37.7 - 47.9 %   MCV 93.8  80 - 97 fL   MCH, POC 31.2  27 - 31.2 pg   MCHC 33.2  31.8 - 35.4 g/dL   RDW, POC 12.5     Platelet Count, POC 237  142 - 424 K/uL   MPV 6.4  0 - 99.8 fL     EKG/XRAY:   Primary read interpreted by Dr. Marin Comment at [facility]. Left foot-? Shadow  Vs less likely fracture at medial cuniform  Right ankle neg for fx ordislocation   ASSESSMENT/PLAN: Encounter Diagnoses  Name Primary?  . Right ankle pain Yes  . Left foot pain   . Hormone replacement therapy (HRT)   . Sprain and strain of deltoid (ligament) of ankle, right, initial encounter    HRT will continue until she sees if she fels ok on it  She will ace wrap her ankels, wear tennis shoes,  RICE F/u prn othewrise in  6 months, she can get refills of her ambien and adderall from me, she has 1 refill of her adderrall eleft form Dr Redmond School, changing PCP  Gross sideeffects, risk and benefits, and alternatives of medications d/w patient. Patient is aware that all medications have potential sideeffects and we are unable to predict every sideeffect or drug-drug interaction that may occur.  Makenzee Choudhry, [place], DO 09/29/2014 10:50 AM

## 2014-10-27 ENCOUNTER — Telehealth: Payer: Self-pay | Admitting: Family Medicine

## 2014-10-27 DIAGNOSIS — F988 Other specified behavioral and emotional disorders with onset usually occurring in childhood and adolescence: Secondary | ICD-10-CM

## 2014-10-27 MED ORDER — AMPHETAMINE-DEXTROAMPHET ER 15 MG PO CP24: 15.0000 mg | ORAL_CAPSULE | ORAL | Status: DC

## 2014-10-27 NOTE — Telephone Encounter (Signed)
RX printed 

## 2014-10-27 NOTE — Telephone Encounter (Signed)
Adderrall refill  [phone]

## 2014-10-30 NOTE — Telephone Encounter (Signed)
Notified pt ready. 

## 2014-11-20 ENCOUNTER — Other Ambulatory Visit: Payer: Self-pay | Admitting: Physician Assistant

## 2014-11-20 ENCOUNTER — Other Ambulatory Visit: Payer: Self-pay | Admitting: Family Medicine

## 2014-11-20 ENCOUNTER — Other Ambulatory Visit: Payer: Self-pay

## 2014-11-20 NOTE — Telephone Encounter (Signed)
Is this okay?

## 2014-11-22 NOTE — Telephone Encounter (Signed)
Called in.

## 2014-11-24 ENCOUNTER — Other Ambulatory Visit: Payer: Self-pay | Admitting: Family Medicine

## 2014-11-24 DIAGNOSIS — Z1231 Encounter for screening mammogram for malignant neoplasm of breast: Secondary | ICD-10-CM

## 2014-12-18 ENCOUNTER — Ambulatory Visit (INDEPENDENT_AMBULATORY_CARE_PROVIDER_SITE_OTHER): Payer: 59 | Admitting: Family Medicine

## 2014-12-18 ENCOUNTER — Ambulatory Visit (INDEPENDENT_AMBULATORY_CARE_PROVIDER_SITE_OTHER): Payer: 59

## 2014-12-18 VITALS — BP 140/94 | HR 77 | Temp 98.0°F | Resp 20 | Ht 64.0 in | Wt 171.0 lb

## 2014-12-18 DIAGNOSIS — R109 Unspecified abdominal pain: Secondary | ICD-10-CM

## 2014-12-18 DIAGNOSIS — R10A1 Flank pain, right side: Secondary | ICD-10-CM

## 2014-12-18 DIAGNOSIS — K5900 Constipation, unspecified: Secondary | ICD-10-CM

## 2014-12-18 LAB — POCT UA - MICROSCOPIC ONLY
Casts, Ur, LPF, POC: NEGATIVE
Crystals, Ur, HPF, POC: NEGATIVE
Mucus, UA: NEGATIVE
Yeast, UA: NEGATIVE

## 2014-12-18 LAB — POCT URINALYSIS DIPSTICK
Bilirubin, UA: NEGATIVE
Glucose, UA: NEGATIVE
Ketones, UA: NEGATIVE
Leukocytes, UA: NEGATIVE
Nitrite, UA: NEGATIVE
Protein, UA: NEGATIVE
Spec Grav, UA: >=1.03
Urobilinogen, UA: 0.2
pH, UA: 6

## 2014-12-18 MED ORDER — POLYETHYLENE GLYCOL 3350 17 GM/SCOOP PO POWD: 17.0000 g | Freq: Two times a day (BID) | ORAL | Status: DC | PRN

## 2014-12-18 NOTE — Addendum Note (Signed)
Addended by: Kem Boroughs D on: 12/18/2014 08:34 PM   Modules accepted: Orders

## 2014-12-18 NOTE — Progress Notes (Signed)
Subjective:  This chart was scribed for Robyn Haber, MD by Mercy Moore, Medial Scribe. This patient was seen in room 2 and the patient's care was started at 7:21 PM.    Patient ID: [name] [name], female    DOB: 07/07/1966, 49 y.o.   MRN: [id number]  Chief Complaint  Patient presents with  . Flank Pain    Right-since Friday    HPI HPI Comments: [name] [name] is a 49 y.o. female who presents to the [facility] complaining of sudden onset right flank pain since Friday, four days ago. Patient shares history of IC, but reports that her current pain is different in quality. Patient reports that she has had this pain before but states that is has never lasted so long. Patient reports radiation into her RLQ. Patient denies fall or direct injury. Patient denies fever, urinary symptoms, radiation of pain into her legs. Patient reports family history of constipation in her mother and grandmother.   Patient works at [employer] at [employer].   Patient Active Problem List   Diagnosis Date Noted  . Dysthymia 08/04/2012  . ADD (attention deficit disorder) 08/04/2012  . Sleep disturbance 08/04/2012  . IC (interstitial cystitis) 08/04/2012  . H/O hysterectomy for benign disease 04/07/2012   Past Medical History  Diagnosis Date  . Depression   . Interstitial cystitis   . Cervical radiculopathy 2014    L arm (into L4th and 5th fingers)  . Anxiety    Past Surgical History  Procedure Laterality Date  . Knee surgery      both knees  . Breast reduction surgery      age 64yo  . Bladder surgery      due to interstitial cystitis  . Abdominal hysterectomy      complete, benign pathology   No Known Allergies Prior to Admission medications   Medication Sig Start Date End Date Taking? Authorizing Provider  amphetamine-dextroamphetamine (ADDERALL XR) 15 MG 24 hr capsule Take 1 capsule by mouth every morning. 10/27/14  Yes Thao P Le, DO  buPROPion (WELLBUTRIN SR) 150 MG 12  hr tablet TAKE 1 TABLET BY MOUTH TWICE DAILY 07/24/14  Yes Denita Lung, MD  estrogen-methylTESTOSTERone (EST ESTROGENS-METHYLTEST HS) 0.625-1.25 MG per tablet TAKE 1 TABLET BY MOUTH ONCE DAILY AT BEDTIME 08/22/14  Yes Thao P Le, DO  zolpidem (AMBIEN) 10 MG tablet TAKE 1/2 TABLET BY MOUTH AT BEDTIME 11/21/14  Yes Thao P Le, DO   History   Social History  . Marital Status: Married    Spouse Name: N/A    Number of Children: N/A  . Years of Education: N/A   Occupational History  . Not on file.   Social History Main Topics  . Smoking status: Former Smoker    Quit date: 08/04/1990  . Smokeless tobacco: Never Used  . Alcohol Use: Yes     Comment: rare  . Drug Use: No  . Sexual Activity: Yes    Birth Control/ Protection: Surgical   Other Topics Concern  . Not on file   Social History Narrative    Review of Systems  Constitutional: Negative for fever and chills.  Gastrointestinal: Positive for abdominal pain.  Genitourinary: Positive for flank pain. Negative for dysuria and hematuria.  Skin: Negative for color change and rash.       Objective:   Physical Exam  Constitutional: She is oriented to person, place, and time. She appears well-developed and well-nourished. No distress.  HENT:  Head: Normocephalic and atraumatic.  Eyes: EOM are normal.  Neck: Neck supple. No tracheal deviation present.  Cardiovascular: Normal rate.   Pulmonary/Chest: Effort normal. No respiratory distress.  Abdominal: There is tenderness.  Minimal tenderness with deep palpation of RLQ.   Musculoskeletal: Normal range of motion.  Neurological: She is alert and oriented to person, place, and time.  Skin: Skin is warm and dry.  Psychiatric: She has a normal mood and affect. Her behavior is normal.  Nursing note and vitals reviewed.   Filed Vitals:   12/18/14 1903  BP: 140/94  Pulse: 77  Temp: 98 F (36.7 C)  TempSrc: Oral  Resp: 20  Height: 5\' 4"  (1.626 m)  Weight: 171 lb (77.565 kg)    SpO2: 98%     Results for orders placed or performed in visit on 12/18/14  POCT urinalysis dipstick  Result Value Ref Range   Color, UA yellow    Clarity, UA clear    Glucose, UA neg    Bilirubin, UA neg    Ketones, UA neg    Spec Grav, UA >=1.030    Blood, UA trace    pH, UA 6.0    Protein, UA neg    Urobilinogen, UA 0.2    Nitrite, UA neg    Leukocytes, UA Negative   POCT UA - Microscopic Only  Result Value Ref Range   WBC, Ur, HPF, POC 0-2    RBC, urine, microscopic 1-3    Bacteria, U Microscopic trace    Mucus, UA neg    Epithelial cells, urine per micros 0-2    Crystals, Ur, HPF, POC neg    Casts, Ur, LPF, POC neg    Yeast, UA neg    [facility] reading (PRIMARY) by  Dr. Joseph Art FPA-FOS      Assessment & Plan:    I personally performed the services described in this documentation, which was scribed in my presence. The recorded information has been reviewed and is accurate. This chart was scribed in my presence and reviewed by me personally.    ICD-9-CM ICD-10-CM   1. Right flank pain 789.09 R10.9 POCT urinalysis dipstick     POCT UA - Microscopic Only     DG Abd 1 View     polyethylene glycol powder (GLYCOLAX/MIRALAX) powder  2. Constipation, unspecified constipation type 564.00 K59.00 polyethylene glycol powder (GLYCOLAX/MIRALAX) powder     Signed, Robyn Haber, MD

## 2014-12-18 NOTE — Patient Instructions (Signed)

## 2014-12-19 ENCOUNTER — Telehealth: Payer: Self-pay

## 2014-12-19 NOTE — Telephone Encounter (Signed)
Pharm called with [facility] about Mirilax Rx. She wanted to verify that Dr Joseph Art wants pt to take this amount BID (it is usually Rxd only QD), and if it is something that pt will probably need to continue?

## 2014-12-19 NOTE — Telephone Encounter (Signed)
PT WAS SEEN LAST NIGHT, AND IS REQUESTING TO SPEAK WITH DR LE. PT STILL HAVING SEVERE PAIN

## 2014-12-19 NOTE — Telephone Encounter (Signed)
Twice daily for 3 days, then once dailiy for another 4, then twice weekly

## 2014-12-20 ENCOUNTER — Ambulatory Visit (INDEPENDENT_AMBULATORY_CARE_PROVIDER_SITE_OTHER): Payer: 59 | Admitting: Family Medicine

## 2014-12-20 VITALS — BP 132/84 | HR 70 | Temp 98.8°F | Resp 18 | Ht 64.0 in | Wt 170.4 lb

## 2014-12-20 DIAGNOSIS — M5489 Other dorsalgia: Secondary | ICD-10-CM

## 2014-12-20 DIAGNOSIS — K5900 Constipation, unspecified: Secondary | ICD-10-CM

## 2014-12-20 NOTE — Telephone Encounter (Signed)
Pt states she has been following instructions on Mirilax per Lauenstein's instructions. She is still experiencing abdominal pain and has not used the restroom since last OV here. She wants to know what she should do at this point? I asked her to come in and be seen by PCP, Dr. Marin Comment since she will be in tonight. She understood and will be coming in.

## 2014-12-20 NOTE — Patient Instructions (Signed)

## 2014-12-20 NOTE — Telephone Encounter (Signed)
She saw Dr Joseph Art,   I will reroute it to him.   Thanks, Dr Marin Comment

## 2014-12-20 NOTE — Telephone Encounter (Signed)
Please ask patient to return for further evaluation

## 2014-12-20 NOTE — Telephone Encounter (Signed)
LMOM to CB to go over medication instructions. There is also another phone message within the clinical message, please address as well.

## 2014-12-20 NOTE — Progress Notes (Signed)
Chief Complaint:  Chief Complaint  Patient presents with  . Follow-up    back pain     HPI: [name] [name] is a 49 y.o. female who is here for recheck of her back pain and constipation, still has not gone to the bathroom since MOnday, She ahs taken Miralax z 2 days now, she ahs right upper back pain/flank pain without nausea, vomiting, stomach distension or issues urianting. She ahs no abd pain whatsoever, no fevers or chills. Her abdomen is normal to her. She doe snot feel bloated. She has had problems with constipation before where her mom had to disimpact her once after she took pain pills but has not had it since. She felt so much worse then. Her urine on her visit 2 days ago was + for trace blood but the sray was showing moderate stool burdern throughout colon. SHe is passing gas, she did have 1-2 pellets of poop whoch she does not consider to be a "real bowel movement" today but no other progress. Mom has a history of conbstipation and has been wanting her to take dulcolax and also suppositories and enemas but she hates having to use suppositories or enemas  CLINICAL DATA: Initial evaluation sudden onset right flank pain for days ago common radiates into right lower quadrant  EXAM: ABDOMEN - 1 VIEW  COMPARISON: None.  FINDINGS: Moderate stool throughout the entire colon. No abnormally dilated loops of bowel. No abnormal opacities. Calcific densities bilaterally in the inferior pelvis on an area that was not previously imaged, likely representing phleboliths.  IMPRESSION: Constipation.  Bilateral small calcific densities in the inferior pelvis likely represent phleboliths.   Electronically Signed  By: Skipper Cliche M.D.  On: 12/18/2014 20:40  Past Medical History  Diagnosis Date  . Depression   . Interstitial cystitis   . Cervical radiculopathy 2014    L arm (into L4th and 5th fingers)  . Anxiety    Past Surgical History  Procedure Laterality  Date  . Knee surgery      both knees  . Breast reduction surgery      age 69yo  . Bladder surgery      due to interstitial cystitis  . Abdominal hysterectomy      complete, benign pathology   History   Social History  . Marital Status: Married    Spouse Name: N/A    Number of Children: N/A  . Years of Education: N/A   Social History Main Topics  . Smoking status: Former Smoker    Quit date: 08/04/1990  . Smokeless tobacco: Never Used  . Alcohol Use: Yes     Comment: rare  . Drug Use: No  . Sexual Activity: Yes    Birth Control/ Protection: Surgical   Other Topics Concern  . None   Social History Narrative   Family History  Problem Relation Age of Onset  . Stroke Brother   . Diabetes Brother   . Hyperlipidemia Brother   . Heart disease Father   . Stroke Father   . Mental illness Father   . Mental illness Sister    No Known Allergies Prior to Admission medications   Medication Sig Start Date End Date Taking? Authorizing Provider  amphetamine-dextroamphetamine (ADDERALL XR) 15 MG 24 hr capsule Take 1 capsule by mouth every morning. 10/27/14  Yes Thao P Le, DO  buPROPion (WELLBUTRIN SR) 150 MG 12 hr tablet TAKE 1 TABLET BY MOUTH TWICE DAILY 07/24/14  Yes Elyse Jarvis  Redmond School, MD  estrogen-methylTESTOSTERone (EST ESTROGENS-METHYLTEST HS) 0.625-1.25 MG per tablet TAKE 1 TABLET BY MOUTH ONCE DAILY AT BEDTIME 08/22/14  Yes Thao P Le, DO  polyethylene glycol powder (GLYCOLAX/MIRALAX) powder Take 17 g by mouth 2 (two) times daily as needed. 12/18/14  Yes Robyn Haber, MD  zolpidem (AMBIEN) 10 MG tablet TAKE 1/2 TABLET BY MOUTH AT BEDTIME 11/21/14  Yes Thao P Le, DO     ROS: The patient denies fevers, chills, night sweats, unintentional weight loss, chest pain, palpitations, wheezing, dyspnea on exertion, nausea, vomiting, abdominal pain, dysuria, hematuria, melena, numbness, weakness, or tingling.   All other systems have been reviewed and were otherwise negative with the  exception of those mentioned in the HPI and as above.    PHYSICAL EXAM: Filed Vitals:   12/20/14 1931  BP: 132/84  Pulse: 70  Temp: 98.8 F (37.1 C)  Resp: 18   Filed Vitals:   12/20/14 1931  Height: 5\' 4"  (1.626 m)  Weight: 170 lb 6.4 oz (77.293 kg)   Body mass index is 29.23 kg/(m^2).  General: Alert, no acute distress HEENT:  Normocephalic, atraumatic, oropharynx patent. EOMI, PERRLA Cardiovascular:  Regular rate and rhythm, no rubs murmurs or gallops.  No Carotid bruits, radial pulse intact. No pedal edema.  Respiratory: Clear to auscultation bilaterally.  No wheezes, rales, or rhonchi.  No cyanosis, no use of accessory musculature GI: No organomegaly, abdomen is soft and non-tender, positive bowel sounds.  No masses. NO acute abdomen Skin: No rashes. Neurologic: Facial musculature symmetric. Psychiatric: Patient is appropriate throughout our interaction. Lymphatic: No cervical lymphadenopathy Musculoskeletal: Gait intact. She has minimal back painw ith certain movements, she ahs been able to move and stretch her back, no typical of kidney pain  LABS: Results for orders placed or performed in visit on 12/18/14  POCT urinalysis dipstick  Result Value Ref Range   Color, UA yellow    Clarity, UA clear    Glucose, UA neg    Bilirubin, UA neg    Ketones, UA neg    Spec Grav, UA >=1.030    Blood, UA trace    pH, UA 6.0    Protein, UA neg    Urobilinogen, UA 0.2    Nitrite, UA neg    Leukocytes, UA Negative   POCT UA - Microscopic Only  Result Value Ref Range   WBC, Ur, HPF, POC 0-2    RBC, urine, microscopic 1-3    Bacteria, U Microscopic trace    Mucus, UA neg    Epithelial cells, urine per micros 0-2    Crystals, Ur, HPF, POC neg    Casts, Ur, LPF, POC neg    Yeast, UA neg      EKG/XRAY:   Primary read interpreted by Dr. Marin Comment at [facility].   ASSESSMENT/PLAN: Encounter Diagnoses  Name Primary?  . Constipation, unspecified constipation type Yes  . Right-sided  back pain, unspecified location    Even though did not pick up kidney stone on xray may still be possibility , her pain level is not consistent with renal pain, she is able to do stretches without wincing vs constipation, will ask patient to flush out with fluids Her pain has not moved at all  And she has not had any BM She doe snot feel intense pain No e/o obstruction or acute abd Will f/u in 2 days and will rx amitiza prn  F/u prn , precautions given  Gross sideeffects, risk and benefits, and alternatives of medications  d/w patient. Patient is aware that all medications have potential sideeffects and we are unable to predict every sideeffect or drug-drug interaction that may occur.  LE, [place], DO 12/20/2014 9:15 PM

## 2014-12-20 NOTE — Telephone Encounter (Signed)
I (Gina g) didn't put a message in because Olivia Mackie is going to call her back.

## 2014-12-20 NOTE — Telephone Encounter (Signed)
Patient called again to inquire about her issues. Previous phone message was not communicated to patient. Please advise.  [phone]

## 2014-12-21 ENCOUNTER — Encounter: Payer: Self-pay | Admitting: Family Medicine

## 2014-12-21 ENCOUNTER — Other Ambulatory Visit: Payer: Self-pay | Admitting: Family Medicine

## 2014-12-21 MED ORDER — LUBIPROSTONE 24 MCG PO CAPS: 24.0000 ug | ORAL_CAPSULE | Freq: Two times a day (BID) | ORAL | Status: DC

## 2014-12-22 ENCOUNTER — Encounter: Payer: Self-pay | Admitting: Family Medicine

## 2014-12-24 NOTE — Telephone Encounter (Signed)
Spoke with patient, she has minimal blood with wiping of her bottom after BM, had 2 BMs yesterday , no CP or dizziness, she will monitor for color. No bowel distension. NO fevers or chills. Shehas not had enema or suppository.

## 2014-12-25 ENCOUNTER — Encounter: Payer: Self-pay | Admitting: Family Medicine

## 2015-01-03 ENCOUNTER — Ambulatory Visit (HOSPITAL_COMMUNITY): Payer: 59

## 2015-01-03 ENCOUNTER — Ambulatory Visit (HOSPITAL_COMMUNITY)
Admission: RE | Admit: 2015-01-03 | Discharge: 2015-01-03 | Disposition: A | Discharge status: Home / Self Care | Payer: 59 | Source: Ambulatory Visit | Attending: Family Medicine | Admitting: Family Medicine

## 2015-01-03 DIAGNOSIS — Z1231 Encounter for screening mammogram for malignant neoplasm of breast: Secondary | ICD-10-CM | POA: Insufficient documentation

## 2015-01-25 ENCOUNTER — Other Ambulatory Visit: Payer: Self-pay | Admitting: Family Medicine

## 2015-01-29 NOTE — Telephone Encounter (Signed)
Called in Rx

## 2015-02-07 ENCOUNTER — Telehealth: Payer: Self-pay

## 2015-02-07 DIAGNOSIS — F988 Other specified behavioral and emotional disorders with onset usually occurring in childhood and adolescence: Secondary | ICD-10-CM

## 2015-02-07 NOTE — Telephone Encounter (Signed)
Pt has not been seen here since 1/6 by Dr. Marin Comment. Pt has not had Adderall prescribed here since 10/27/14. She was given a months worth in last November. Looking through her med hx it looks like Dr. Redmond School is the provider that refills her adderall.  Please advise.

## 2015-02-07 NOTE — Telephone Encounter (Signed)
The chart says that Dr. Redmond School is the PCP, but it looks like Dr. Marin Comment did this patient's CPE 08/22/2014.  Please clarify with the patient who is her PCP. If it's Dr. Redmond School, then she needs to make her request from his office. If it's Dr. Marin Comment, we need to update the chart to reflect that, and send this request to Dr. Marin Comment.

## 2015-02-07 NOTE — Telephone Encounter (Signed)
Pt in need of her ADDERALL XR 15mg s. Please call [phone] when ready for pick up

## 2015-02-08 MED ORDER — AMPHETAMINE-DEXTROAMPHET ER 15 MG PO CP24: 15.0000 mg | ORAL_CAPSULE | ORAL | Status: DC

## 2015-02-08 NOTE — Telephone Encounter (Signed)
Dr. Marin Comment is now the PCP.  Please update records.   Call patient when ready  [phone]

## 2015-02-08 NOTE — Telephone Encounter (Signed)
Called patient, printed out scripts for 3 months to pickup, need visit for more refills beyond that

## 2015-03-12 ENCOUNTER — Ambulatory Visit (INDEPENDENT_AMBULATORY_CARE_PROVIDER_SITE_OTHER): Payer: 59 | Admitting: Family Medicine

## 2015-03-12 VITALS — BP 130/88 | HR 84 | Temp 98.0°F | Resp 18 | Ht 64.5 in | Wt 172.6 lb

## 2015-03-12 DIAGNOSIS — F909 Attention-deficit hyperactivity disorder, unspecified type: Secondary | ICD-10-CM | POA: Diagnosis not present

## 2015-03-12 DIAGNOSIS — K0510 Chronic gingivitis, plaque induced: Secondary | ICD-10-CM | POA: Diagnosis not present

## 2015-03-12 DIAGNOSIS — J029 Acute pharyngitis, unspecified: Secondary | ICD-10-CM

## 2015-03-12 DIAGNOSIS — F988 Other specified behavioral and emotional disorders with onset usually occurring in childhood and adolescence: Secondary | ICD-10-CM

## 2015-03-12 LAB — POCT RAPID STREP A (OFFICE): Rapid Strep A Screen: NEGATIVE

## 2015-03-12 MED ORDER — AMOXICILLIN 500 MG PO CAPS: 500.0000 mg | ORAL_CAPSULE | Freq: Three times a day (TID) | ORAL | Status: DC

## 2015-03-12 NOTE — Progress Notes (Signed)
Chief Complaint:  Chief Complaint  Patient presents with  . Ear Pain    left side  . Sore Throat    HPI: [name] [name] is a 49 y.o. female who is here for  1. 2 day history of left ear pain, sore throat. Associated with subjective fevers or chills but is not sure. Her son was home from [place] for college. He was not sick. Has not taken anything for it  2. She is doing well with her ADD medicine. She has no side effects. Denies any suicidal/homicidal ideation or hallucinations. She does not have any weight loss or palpitations with the medication. Denies any abuse issues. 3. She is doing well with her Wellbutrin. She is not having any side effects. She is compliant with medication.  Past Medical History  Diagnosis Date  . Depression   . Interstitial cystitis   . Cervical radiculopathy 2014    L arm (into L4th and 5th fingers)  . Anxiety   . ADD (attention deficit disorder)    Past Surgical History  Procedure Laterality Date  . Knee surgery      both knees  . Breast reduction surgery      age 3yo  . Bladder surgery      due to interstitial cystitis  . Abdominal hysterectomy      complete, benign pathology   History   Social History  . Marital Status: Married    Spouse Name: N/A  . Number of Children: N/A  . Years of Education: N/A   Social History Main Topics  . Smoking status: Former Smoker    Quit date: 08/04/1990  . Smokeless tobacco: Never Used  . Alcohol Use: Yes     Comment: rare  . Drug Use: No  . Sexual Activity: Yes    Birth Control/ Protection: Surgical   Other Topics Concern  . None   Social History Narrative   Family History  Problem Relation Age of Onset  . Stroke Brother   . Diabetes Brother   . Hyperlipidemia Brother   . Heart disease Father   . Stroke Father   . Mental illness Father   . Mental illness Sister    No Known Allergies Prior to Admission medications   Medication Sig Start Date End Date Taking? Authorizing  Provider  amphetamine-dextroamphetamine (ADDERALL XR) 15 MG 24 hr capsule Take 1 capsule by mouth every morning. 02/08/15  Yes Violett Hobbs P Euline Kimbler, DO  amphetamine-dextroamphetamine (ADDERALL XR) 15 MG 24 hr capsule Take 1 capsule by mouth every morning. 02/08/15  Yes Asna Muldrow P Rebekah Zackery, DO  buPROPion (WELLBUTRIN SR) 150 MG 12 hr tablet TAKE 1 TABLET BY MOUTH TWICE DAILY 07/24/14  Yes Denita Lung, MD  estrogen-methylTESTOSTERone (EST ESTROGENS-METHYLTEST HS) 0.625-1.25 MG per tablet TAKE 1 TABLET BY MOUTH ONCE DAILY AT BEDTIME 08/22/14  Yes Mohini Heathcock P Earlene Bjelland, DO  lubiprostone (AMITIZA) 24 MCG capsule Take 1 capsule (24 mcg total) by mouth 2 (two) times daily with a meal. 12/21/14  Yes Kambria Grima P Tayelor Osborne, DO  zolpidem (AMBIEN) 10 MG tablet TAKE 1/2 TABLET BY MOUTH AT BEDTIME 01/26/15  Yes Miachel Nardelli P Hollis Tuller, DO  polyethylene glycol powder (GLYCOLAX/MIRALAX) powder Take 17 g by mouth 2 (two) times daily as needed. Patient not taking: Reported on 03/12/2015 12/18/14   Robyn Haber, MD     ROS: The patient denies night sweats, unintentional weight loss, chest pain, palpitations, wheezing, dyspnea on exertion, nausea, vomiting, abdominal pain, dysuria, hematuria, melena, numbness,  weakness, or tingling.   All other systems have been reviewed and were otherwise negative with the exception of those mentioned in the HPI and as above.    PHYSICAL EXAM: Filed Vitals:   03/12/15 0935  BP: 130/88  Pulse: 84  Temp: 98 F (36.7 C)  Resp: 18   Filed Vitals:   03/12/15 0935  Height: 5' 4.5" (1.638 m)  Weight: 172 lb 9.6 oz (78.291 kg)   Body mass index is 29.18 kg/(m^2).  General: Alert, no acute distress HEENT:  Normocephalic, atraumatic, oropharynx patent. EOMI, PERRLA Minimally Erythematous throat, no exudates, TM normal, neg sinus tenderness, + erythematous/boggy nasal mucosa, + minimal left lower gingival mucosa swelling Cardiovascular:  Regular rate and rhythm, no rubs murmurs or gallops.  No Carotid bruits, radial pulse intact. No pedal  edema.  Respiratory: Clear to auscultation bilaterally.  No wheezes, rales, or rhonchi.  No cyanosis, no use of accessory musculature GI: No organomegaly, abdomen is soft and non-tender, positive bowel sounds.  No masses. Skin: No rashes. Neurologic: Facial musculature symmetric. Psychiatric: Patient is appropriate throughout our interaction. Lymphatic: No cervical lymphadenopathy Musculoskeletal: Gait intact.   LABS: Results for orders placed or performed in visit on 03/12/15  POCT rapid strep A  Result Value Ref Range   Rapid Strep A Screen Negative Negative     EKG/XRAY:   Primary read interpreted by Dr. Marin Comment at [facility].    ASSESSMENT/PLAN: Encounter Diagnoses  Name Primary?  . Sore throat Yes  . Gingivitis   . ADD (attention deficit disorder)    Patient was prescribed amoxicillin with infection of her gingival pain, and if she has strep throat and will also treat that. Throat culture pending She  is doing well on her ADD medications. She will call in for Adderall and depression/anxiety  medication refills. She calls and she is able to get refills on her Adderall starting 05/12/2015 for the next 3 months. I can refill her Wellbutrin for 6 months when she is ready for a new prescription. Discussed also getting off of her estrogen pills. We will consider it later on date. Does not know when her mother went through menopause.  Gross sideeffects, risk and benefits, and alternatives of medications d/w patient. Patient is aware that all medications have potential sideeffects and we are unable to predict every sideeffect or drug-drug interaction that may occur.  Laquitta Dominski, [place], DO 03/12/2015 6:20 PM

## 2015-03-13 LAB — CULTURE, GROUP A STREP: Organism ID, Bacteria: NORMAL

## 2015-03-29 ENCOUNTER — Other Ambulatory Visit: Payer: Self-pay | Admitting: Family Medicine

## 2015-04-02 NOTE — Telephone Encounter (Signed)
Faxed

## 2015-05-04 ENCOUNTER — Encounter: Payer: Self-pay | Admitting: Family Medicine

## 2015-05-15 ENCOUNTER — Telehealth: Payer: Self-pay

## 2015-05-15 DIAGNOSIS — F988 Other specified behavioral and emotional disorders with onset usually occurring in childhood and adolescence: Secondary | ICD-10-CM

## 2015-05-15 NOTE — Telephone Encounter (Signed)
Please advise 

## 2015-05-15 NOTE — Telephone Encounter (Signed)
Patient needs a refill on her  Adderall.  She is completely out and would like for the first provider available to look at refilling this for her. [phone]

## 2015-05-16 ENCOUNTER — Other Ambulatory Visit: Payer: Self-pay | Admitting: Family Medicine

## 2015-05-16 DIAGNOSIS — F988 Other specified behavioral and emotional disorders with onset usually occurring in childhood and adolescence: Secondary | ICD-10-CM

## 2015-05-16 MED ORDER — AMPHETAMINE-DEXTROAMPHET ER 15 MG PO CP24: 15.0000 mg | ORAL_CAPSULE | ORAL | Status: DC

## 2015-05-16 NOTE — Telephone Encounter (Signed)
Called pt and LM , she can pick up after 4 pm today

## 2015-05-22 ENCOUNTER — Other Ambulatory Visit: Payer: Self-pay | Admitting: Family Medicine

## 2015-05-24 ENCOUNTER — Telehealth: Payer: Self-pay

## 2015-05-24 ENCOUNTER — Other Ambulatory Visit: Payer: Self-pay | Admitting: Family Medicine

## 2015-05-24 MED ORDER — EST ESTROGENS-METHYLTEST 0.625-1.25 MG PO TABS: ORAL_TABLET | ORAL | Status: DC

## 2015-05-24 NOTE — Telephone Encounter (Signed)
Rx sent in. Pt notified on voicemail.

## 2015-05-24 NOTE — Telephone Encounter (Signed)
Pt called checking on her estrogen-methylTESTOSTERone (EST ESTROGENS-METHYLTEST HS) 0.625-1.25 MG per tablet [[id number] prescription. I found where it has been printed. I've looked everywhere, including 104. I can't find it. She is going out of town on Monday and her pharmacy is closed on the weekend. Please advise at [phone]

## 2015-06-13 ENCOUNTER — Ambulatory Visit (INDEPENDENT_AMBULATORY_CARE_PROVIDER_SITE_OTHER): Payer: 59 | Admitting: Family Medicine

## 2015-06-13 ENCOUNTER — Ambulatory Visit (INDEPENDENT_AMBULATORY_CARE_PROVIDER_SITE_OTHER): Payer: 59

## 2015-06-13 VITALS — BP 159/103 | HR 64 | Temp 98.2°F | Resp 16 | Ht 64.25 in | Wt 171.2 lb

## 2015-06-13 DIAGNOSIS — M545 Low back pain, unspecified: Secondary | ICD-10-CM

## 2015-06-13 DIAGNOSIS — H6121 Impacted cerumen, right ear: Secondary | ICD-10-CM | POA: Diagnosis not present

## 2015-06-13 DIAGNOSIS — F909 Attention-deficit hyperactivity disorder, unspecified type: Secondary | ICD-10-CM | POA: Diagnosis not present

## 2015-06-13 DIAGNOSIS — F341 Dysthymic disorder: Secondary | ICD-10-CM

## 2015-06-13 DIAGNOSIS — F988 Other specified behavioral and emotional disorders with onset usually occurring in childhood and adolescence: Secondary | ICD-10-CM

## 2015-06-13 MED ORDER — EST ESTROGENS-METHYLTEST 0.625-1.25 MG PO TABS: ORAL_TABLET | ORAL | Status: DC

## 2015-06-13 MED ORDER — AMPHETAMINE-DEXTROAMPHET ER 15 MG PO CP24: 15.0000 mg | ORAL_CAPSULE | ORAL | Status: DC

## 2015-06-13 MED ORDER — BUPROPION HCL ER (SR) 150 MG PO TB12: ORAL_TABLET | ORAL | Status: DC

## 2015-06-13 MED ORDER — AMPHETAMINE-DEXTROAMPHET ER 15 MG PO CP24
15.0000 mg | ORAL_CAPSULE | ORAL | Status: DC
Start: 2015-06-13 — End: 2015-10-01

## 2015-06-13 MED ORDER — ZOLPIDEM TARTRATE 5 MG PO TABS: ORAL_TABLET | ORAL | Status: DC

## 2015-06-13 NOTE — Progress Notes (Signed)
Chief Complaint:  Chief Complaint  Patient presents with  . Medication Refill    HPI: [name] [name] is a 49 y.o. female who is here for medication refills. She is doing well. She does not have any side effects. She has had some right sided low back pain with numbness and tingling for the last 3 weeks. She does not know if she she did anything wrong to it. There is no known injury or trauma. She has no incontinence.  She has no weakness. He does have a history of general arthritis/wear and tear in her back. She is to be a gymnast for a long time. She manages her back aches fairly well but this recent episode she has not been able to shake. This has been ongoing for the last 3 weeks. She denies any dysuria or urinary symptoms. She has had bilateral knee surgeries. Denies any chest pain shortness of breath palpitations weight loss suicidal homicidal ideation or hallucinations  Past Medical History  Diagnosis Date  . Depression   . Interstitial cystitis   . Cervical radiculopathy 2014    L arm (into L4th and 5th fingers)  . Anxiety   . ADD (attention deficit disorder)    Past Surgical History  Procedure Laterality Date  . Knee surgery      both knees  . Breast reduction surgery      age 66yo  . Bladder surgery      due to interstitial cystitis  . Abdominal hysterectomy      complete, benign pathology   History   Social History  . Marital Status: Married    Spouse Name: N/A  . Number of Children: N/A  . Years of Education: N/A   Social History Main Topics  . Smoking status: Former Smoker    Quit date: 08/04/1990  . Smokeless tobacco: Never Used  . Alcohol Use: Yes     Comment: rare  . Drug Use: No  . Sexual Activity: Yes    Birth Control/ Protection: Surgical   Other Topics Concern  . None   Social History Narrative   Family History  Problem Relation Age of Onset  . Stroke Brother   . Diabetes Brother   . Hyperlipidemia Brother   . Heart disease  Father   . Stroke Father   . Mental illness Father   . Mental illness Sister    No Known Allergies Prior to Admission medications   Medication Sig Start Date End Date Taking? Authorizing Provider  amphetamine-dextroamphetamine (ADDERALL XR) 15 MG 24 hr capsule Take 1 capsule by mouth every morning. 05/16/15  Yes Joyleen Haselton P Zalika Tieszen, DO  buPROPion (WELLBUTRIN SR) 150 MG 12 hr tablet TAKE 1 TABLET BY MOUTH TWICE DAILY 07/24/14  Yes Denita Lung, MD  estrogen-methylTESTOSTERone (EST ESTROGENS-METHYLTEST HS) 0.625-1.25 MG per tablet TAKE 1 TABLET BY MOUTH ONCE DAILY AT BEDTIME.  "OV NEEDED FOR ADDITIONAL REFILLS" 05/24/15  Yes Chelle Jeffery, PA-C  zolpidem (AMBIEN) 10 MG tablet TAKE 1/2 TABLET BY MOUTH AT BEDTIME 03/31/15  Yes Sarah Alleen Borne, PA-C     ROS: The patient denies fevers, chills, night sweats, unintentional weight loss, chest pain, palpitations, wheezing, dyspnea on exertion, nausea, vomiting, abdominal pain, dysuria, hematuria, melena, numbness, weakness, or tingling  All other systems have been reviewed and were otherwise negative with the exception of those mentioned in the HPI and as above.    PHYSICAL EXAM: Filed Vitals:   06/13/15 1808  BP: 159/103  Pulse: 64  Temp:   Resp:    Filed Vitals:   06/13/15 1722  Height: 5' 4.25" (1.632 m)  Weight: 171 lb 3.2 oz (77.656 kg)   Body mass index is 29.16 kg/(m^2).   General: Alert, no acute distress HEENT:  Normocephalic, atraumatic, oropharynx patent. EOMI, PERRLA Cardiovascular:  Regular rate and rhythm, no rubs murmurs or gallops.  No Carotid bruits, radial pulse intact. No pedal edema.  Respiratory: Clear to auscultation bilaterally.  No wheezes, rales, or rhonchi.  No cyanosis, no use of accessory musculature GI: No organomegaly, abdomen is soft and non-tender, positive bowel sounds.  No masses. Skin: No rashes. Neurologic: Facial musculature symmetric. Psychiatric: Patient is appropriate throughout our interaction. Lymphatic: No  cervical lymphadenopathy Musculoskeletal: Gait intact. + paramsk tenderness  Full ROM 5/5 strength, 2/2 DTRs No saddle anesthesia Straight leg negative Hip and knee exam--normal     LABS: Results for orders placed or performed in visit on 03/12/15  Culture, Group A Strep  Result Value Ref Range   Organism ID, Bacteria Normal Upper Respiratory Flora    Organism ID, Bacteria No Beta Hemolytic Streptococci Isolated   POCT rapid strep A  Result Value Ref Range   Rapid Strep A Screen Negative Negative     EKG/XRAY:   Primary read interpreted by Dr. Marin Comment at [facility]. Neg any acute fractures or dislocation   ASSESSMENT/PLAN: Encounter Diagnoses  Name Primary?  . ADD (attention deficit disorder) Yes  . Dysthymia   . Right-sided low back pain without sciatica    Refill on all medications: Adderall XR for 6 months, Wellbutrin, Ambien, Estrogen-testosterone She will keep an eye on her elevated blood pressure for me. She has never had elevated blood pressures in the past. BP Readings from Last 3 Encounters:  06/13/15 159/103  03/12/15 130/88  12/20/14 132/84   follow-up in 6 months     Gross sideeffects, risk and benefits, and alternatives of medications d/w patient. Patient is aware that all medications have potential sideeffects and we are unable to predict every sideeffect or drug-drug interaction that may occur.  Leslieann Whisman, [place], DO 06/13/2015 6:35 PM

## 2015-09-17 ENCOUNTER — Ambulatory Visit (INDEPENDENT_AMBULATORY_CARE_PROVIDER_SITE_OTHER): Payer: 59 | Admitting: Urgent Care

## 2015-09-17 ENCOUNTER — Encounter: Payer: Self-pay | Admitting: Urgent Care

## 2015-09-17 VITALS — BP 146/98 | HR 79 | Temp 98.8°F | Resp 16 | Ht 65.0 in | Wt 170.0 lb

## 2015-09-17 DIAGNOSIS — F988 Other specified behavioral and emotional disorders with onset usually occurring in childhood and adolescence: Secondary | ICD-10-CM

## 2015-09-17 DIAGNOSIS — Z79890 Hormone replacement therapy (postmenopausal): Secondary | ICD-10-CM

## 2015-09-17 DIAGNOSIS — N63 Unspecified lump in breast: Secondary | ICD-10-CM

## 2015-09-17 DIAGNOSIS — N6310 Unspecified lump in the right breast, unspecified quadrant: Secondary | ICD-10-CM

## 2015-09-17 DIAGNOSIS — F909 Attention-deficit hyperactivity disorder, unspecified type: Secondary | ICD-10-CM

## 2015-09-17 DIAGNOSIS — I10 Essential (primary) hypertension: Secondary | ICD-10-CM

## 2015-09-17 MED ORDER — LISINOPRIL 10 MG PO TABS: 10.0000 mg | ORAL_TABLET | Freq: Every day | ORAL | Status: DC

## 2015-09-17 NOTE — Progress Notes (Signed)
MRN: [id number] DOB: [birth year]  Subjective:   [name] [name] is a 49 y.o. female with pmh of anxiety and ADHD presenting for chief complaint of Hypertension  Elevated BP - reports elevated blood pressure at a health fair today. Patient became really concerned since her measurement was in the 831D systolic and 176H diastolic.  Denies lightheadedness, dizziness, chronic headache, double vision, chest pain, shortness of breath, heart racing, palpitations, nausea, vomiting, abdominal pain, hematuria, lower leg swelling. Patient denies history of hypertension, heart disease. She is currently taking Adderall for long-standing ADHD. He is also on Wellbutrin for anxiety. Patient is also taking hormone replacement therapy  which includes testosterone for her hysterectomy and decreased libido.   Breast issue - reports a 1 month history of right-sided breast mass. Patient denies tenderness, skin changes, nipple inversion, nipple discharge, swollen lymph nodes. Patient gets yearly mammograms. She has a history of breast reduction surgery. Denies family history of breast cancer, patient has 2 children.   Denies any other aggravating or relieving factors, no other questions or concerns.  [name] [name] has a current medication list which includes the following prescription(s): amphetamine-dextroamphetamine, amphetamine-dextroamphetamine, amphetamine-dextroamphetamine, amphetamine-dextroamphetamine, bupropion, estrogen-methyltestosterone, and zolpidem. Also has No Known Allergies.  [name] [name]  has a past medical history of Depression; Interstitial cystitis; Cervical radiculopathy (2014); Anxiety; and ADD (attention deficit disorder). Also  has past surgical history that includes Knee surgery; Breast reduction surgery; Bladder surgery; and Abdominal hysterectomy.  Objective:   Vitals: BP 146/98 mmHg  Pulse 79  Temp(Src) 98.8 F (37.1 C) (Oral)  Resp 16  Ht 5\' 5"  (1.651 m)  Wt 170 lb (77.111 kg)  BMI 28.29  kg/m2  BP Readings from Last 3 Encounters:  09/17/15 146/98  06/13/15 159/103  03/12/15 130/88   Physical Exam  Constitutional: She is oriented to person, place, and time. She appears well-developed and well-nourished.  HENT:  Mouth/Throat: Oropharynx is clear and moist.  Eyes: Conjunctivae are normal. Pupils are equal, round, and reactive to light. No scleral icterus.  Neck: Normal range of motion. Neck supple. No thyromegaly present.  Cardiovascular: Normal rate, regular rhythm and intact distal pulses.  Exam reveals no gallop and no friction rub.   No murmur heard. Pulmonary/Chest: No respiratory distress. She has no wheezes. She has no rales. Right breast exhibits mass and tenderness. Right breast exhibits no inverted nipple, no nipple discharge and no skin change. Left breast exhibits no inverted nipple, no mass, no nipple discharge, no skin change and no tenderness.    No axillary lymphadenopathy.  Genitourinary: Vaginal discharge found.  Musculoskeletal: She exhibits no edema.  Lymphadenopathy:    She has no cervical adenopathy.  Neurological: She is alert and oriented to person, place, and time. No cranial nerve deficit.  Skin: Skin is warm and dry. No rash noted. No erythema. No pallor.  Psychiatric: Her mood appears anxious.   Assessment and Plan :   1. Essential hypertension 2. ADD (attention deficit disorder) 3. Hormone replacement therapy (HRT) - Has had multiple measurements since 2013 of elevated blood pressure without diagnosis of hypertension. At this point it is unclear if this is essential hypertension or secondary to multiple medical therapies contributing to elevated blood pressure; these include Adderall, Wellbutrin and HRT with testosterone. I discussed this with patient and she would prefer not to make changes to her medication regimen given that she is trying to come back for followup with Dr. Marin Comment. In the meantime I recommended she start a low dose of 10 mg  lisinopril. Patient was agreeable and plans on returning to clinic to see Dr. Marin Comment.  4. Breast mass, right - Offered patient breast ultrasound which means she would likely need a diagnostic mammogram. Her last mammogram was very reassuring performed January 2016 with BI-RADS 1. The mass is likely due to fibroadenoma versus fibrocystic changes. She declined further testing for now and will discuss her options with Dr. Marin Comment. I recommend patient to call should change her mind.  Jaynee Eagles, PA-C [facility] [phone] 09/17/2015 4:44 PM

## 2015-09-17 NOTE — Patient Instructions (Signed)
Hypertension Hypertension, commonly called high blood pressure, is when the force of blood pumping through your arteries is too strong. Your arteries are the blood vessels that carry blood from your heart throughout your body. A blood pressure reading consists of a higher number over a lower number, such as 110/72. The higher number (systolic) is the pressure inside your arteries when your heart pumps. The lower number (diastolic) is the pressure inside your arteries when your heart relaxes. Ideally you want your blood pressure below 120/80. Hypertension forces your heart to work harder to pump blood. Your arteries may become narrow or stiff. Having hypertension puts you at risk for heart disease, stroke, and other problems.  RISK FACTORS Some risk factors for high blood pressure are controllable. Others are not.  Risk factors you cannot control include:   Race. You may be at higher risk if you are African American.  Age. Risk increases with age.  Gender. Men are at higher risk than women before age 45 years. After age 65, women are at higher risk than men. Risk factors you can control include:  Not getting enough exercise or physical activity.  Being overweight.  Getting too much fat, sugar, calories, or salt in your diet.  Drinking too much alcohol. SIGNS AND SYMPTOMS Hypertension does not usually cause signs or symptoms. Extremely high blood pressure (hypertensive crisis) may cause headache, anxiety, shortness of breath, and nosebleed. DIAGNOSIS  To check if you have hypertension, your health care provider will measure your blood pressure while you are seated, with your arm held at the level of your heart. It should be measured at least twice using the same arm. Certain conditions can cause a difference in blood pressure between your right and left arms. A blood pressure reading that is higher than normal on one occasion does not mean that you need treatment. If one blood pressure reading  is high, ask your health care provider about having it checked again. TREATMENT  Treating high blood pressure includes making lifestyle changes and possibly taking medicine. Living a healthy lifestyle can help lower high blood pressure. You may need to change some of your habits. Lifestyle changes may include:  Following the DASH diet. This diet is high in fruits, vegetables, and whole grains. It is low in salt, red meat, and added sugars.  Getting at least 2 hours of brisk physical activity every week.  Losing weight if necessary.  Not smoking.  Limiting alcoholic beverages.  Learning ways to reduce stress. If lifestyle changes are not enough to get your blood pressure under control, your health care provider may prescribe medicine. You may need to take more than one. Work closely with your health care provider to understand the risks and benefits. HOME CARE INSTRUCTIONS  Have your blood pressure rechecked as directed by your health care provider.   Take medicines only as directed by your health care provider. Follow the directions carefully. Blood pressure medicines must be taken as prescribed. The medicine does not work as well when you skip doses. Skipping doses also puts you at risk for problems.   Do not smoke.   Monitor your blood pressure at home as directed by your health care provider. SEEK MEDICAL CARE IF:   You think you are having a reaction to medicines taken.  You have recurrent headaches or feel dizzy.  You have swelling in your ankles.  You have trouble with your vision. SEEK IMMEDIATE MEDICAL CARE IF:  You develop a severe headache or confusion.    You have unusual weakness, numbness, or feel faint.  You have severe chest or abdominal pain.  You vomit repeatedly.  You have trouble breathing. MAKE SURE YOU:   Understand these instructions.  Will watch your condition.  Will get help right away if you are not doing well or get worse. Document  Released: 12/01/2005 Document Revised: 04/17/2014 Document Reviewed: 09/23/2013 ExitCare Patient Information 2015 [employer]. This information is not intended to replace advice given to you by your health care provider. Make sure you discuss any questions you have with your health care provider. Lisinopril tablets What is this medicine? LISINOPRIL (lyse IN oh pril) is an ACE inhibitor. This medicine is used to treat high blood pressure and heart failure. It is also used to protect the heart immediately after a heart attack. This medicine may be used for other purposes; ask your health care provider or pharmacist if you have questions. COMMON BRAND NAME(S): Prinivil, Zestril What should I tell my health care provider before I take this medicine? They need to know if you have any of these conditions: -diabetes -heart or blood vessel disease -immune system disease like lupus or scleroderma -kidney disease -low blood pressure -previous swelling of the tongue, face, or lips with difficulty breathing, difficulty swallowing, hoarseness, or tightening of the throat -an unusual or allergic reaction to lisinopril, other ACE inhibitors, insect venom, foods, dyes, or preservatives -pregnant or trying to get pregnant -breast-feeding How should I use this medicine? Take this medicine by mouth with a glass of water. Follow the directions on your prescription label. You may take this medicine with or without food. Take your medicine at regular intervals. Do not stop taking this medicine except on the advice of your doctor or health care professional. Talk to your pediatrician regarding the use of this medicine in children. Special care may be needed. While this drug may be prescribed for children as young as 6 years of age for selected conditions, precautions do apply. Overdosage: If you think you have taken too much of this medicine contact a poison control center or emergency room at once. NOTE: This  medicine is only for you. Do not share this medicine with others. What if I miss a dose? If you miss a dose, take it as soon as you can. If it is almost time for your next dose, take only that dose. Do not take double or extra doses. What may interact with this medicine? -diuretics -lithium -NSAIDs, medicines for pain and inflammation, like ibuprofen or naproxen -over-the-counter herbal supplements like hawthorn -potassium salts or potassium supplements -salt substitutes This list may not describe all possible interactions. Give your health care provider a list of all the medicines, herbs, non-prescription drugs, or dietary supplements you use. Also tell them if you smoke, drink alcohol, or use illegal drugs. Some items may interact with your medicine. What should I watch for while using this medicine? Visit your doctor or health care professional for regular check ups. Check your blood pressure as directed. Ask your doctor what your blood pressure should be, and when you should contact him or her. Call your doctor or health care professional if you notice an irregular or fast heart beat. Women should inform their doctor if they wish to become pregnant or think they might be pregnant. There is a potential for serious side effects to an unborn child. Talk to your health care professional or pharmacist for more information. Check with your doctor or health care professional if you get   an attack of severe diarrhea, nausea and vomiting, or if you sweat a lot. The loss of too much body fluid can make it dangerous for you to take this medicine. You may get drowsy or dizzy. Do not drive, use machinery, or do anything that needs mental alertness until you know how this drug affects you. Do not stand or sit up quickly, especially if you are an older patient. This reduces the risk of dizzy or fainting spells. Alcohol can make you more drowsy and dizzy. Avoid alcoholic drinks. Avoid salt substitutes unless you  are told otherwise by your doctor or health care professional. Do not treat yourself for coughs, colds, or pain while you are taking this medicine without asking your doctor or health care professional for advice. Some ingredients may increase your blood pressure. What side effects may I notice from receiving this medicine? Side effects that you should report to your doctor or health care professional as soon as possible: -abdominal pain with or without nausea or vomiting -allergic reactions like skin rash or hives, swelling of the hands, feet, face, lips, throat, or tongue -dark urine -difficulty breathing -dizzy, lightheaded or fainting spell -fever or sore throat -irregular heart beat, chest pain -pain or difficulty passing urine -redness, blistering, peeling or loosening of the skin, including inside the mouth -unusually weak -yellowing of the eyes or skin Side effects that usually do not require medical attention (report to your doctor or health care professional if they continue or are bothersome): -change in taste -cough -decreased sexual function or desire -headache -sun sensitivity -tiredness This list may not describe all possible side effects. Call your doctor for medical advice about side effects. You may report side effects to FDA at 1-800-FDA-1088. Where should I keep my medicine? Keep out of the reach of children. Store at room temperature between 15 and 30 degrees C (59 and 86 degrees F). Protect from moisture. Keep container tightly closed. Throw away any unused medicine after the expiration date. NOTE: This sheet is a summary. It may not cover all possible information. If you have questions about this medicine, talk to your doctor, pharmacist, or health care provider.  2015, Elsevier/Gold Standard. (2008-06-05 17:36:32)  

## 2015-09-19 ENCOUNTER — Ambulatory Visit (INDEPENDENT_AMBULATORY_CARE_PROVIDER_SITE_OTHER): Payer: 59 | Admitting: Family Medicine

## 2015-09-19 VITALS — BP 130/90 | HR 66 | Temp 98.1°F | Resp 18 | Ht 64.0 in | Wt 171.0 lb

## 2015-09-19 DIAGNOSIS — I10 Essential (primary) hypertension: Secondary | ICD-10-CM

## 2015-09-19 DIAGNOSIS — R42 Dizziness and giddiness: Secondary | ICD-10-CM

## 2015-09-19 DIAGNOSIS — Z13220 Encounter for screening for lipoid disorders: Secondary | ICD-10-CM | POA: Diagnosis not present

## 2015-09-19 LAB — POCT CBC
Granulocyte percent: 50.9 %G (ref 37–80)
HCT, POC: 44.2 % (ref 37.7–47.9)
Hemoglobin: 14.2 g/dL (ref 12.2–16.2)
Lymph, poc: 3.1 (ref 0.6–3.4)
MCH, POC: 29.1 pg (ref 27–31.2)
MCHC: 32 g/dL (ref 31.8–35.4)
MCV: 90.9 fL (ref 80–97)
MID (cbc): 0.6 (ref 0–0.9)
MPV: 6.4 fL (ref 0–99.8)
POC Granulocyte: 3.8 (ref 2–6.9)
POC LYMPH PERCENT: 41.1 %L (ref 10–50)
POC MID %: 8 %M (ref 0–12)
Platelet Count, POC: 253 10*3/uL (ref 142–424)
RBC: 4.86 M/uL (ref 4.04–5.48)
RDW, POC: 12.6 %
WBC: 7.5 10*3/uL (ref 4.6–10.2)

## 2015-09-19 MED ORDER — HYDROCHLOROTHIAZIDE 25 MG PO TABS: ORAL_TABLET | ORAL | Status: DC

## 2015-09-19 NOTE — Progress Notes (Signed)
Chief Complaint:  Chief Complaint  Patient presents with  . Blood Pressure Check    BP was elevated during the health fair last week & went to see a PA & was started on a BP medication    HPI: [name] [name] is a 49 y.o. female who reports to [facility] today complaining of: HTN new dx but she is having dizziness with no other neuro sxs.  Only on lisinopril 10 mg but she is also on other meds ie homrones, wellbutrin and adderall and ambien  which may be contributing factor She has been on these chronic meds for a long time without any issues.  Denies any HA, vision changes, CP SOB or palpitations, confusion, slurred speech, n/w/t She has not gotten the blood pressure cuff yet.  Her last BP reading when she was in our office to see me on 06/13/15  was high due to back pain prior to that she did nto have any BP issues.  She is a nonsmoker, doe snto drink etoh, she is not stressed at work or home   BP Readings from Last 3 Encounters:  09/19/15 130/90  09/17/15 146/98  06/13/15 159/103     Past Medical History  Diagnosis Date  . Depression   . Interstitial cystitis   . Cervical radiculopathy 2014    L arm (into L4th and 5th fingers)  . Anxiety   . ADD (attention deficit disorder)    Past Surgical History  Procedure Laterality Date  . Knee surgery      both knees  . Breast reduction surgery      age 64yo  . Bladder surgery      due to interstitial cystitis  . Abdominal hysterectomy      complete, benign pathology   Social History   Social History  . Marital Status: Married    Spouse Name: N/A  . Number of Children: N/A  . Years of Education: N/A   Social History Main Topics  . Smoking status: Former Smoker    Quit date: 08/04/1990  . Smokeless tobacco: Never Used  . Alcohol Use: Yes     Comment: rare  . Drug Use: No  . Sexual Activity: Yes    Birth Control/ Protection: Surgical   Other Topics Concern  . None   Social History Narrative   Family History   Problem Relation Age of Onset  . Stroke Brother   . Diabetes Brother   . Hyperlipidemia Brother   . Heart disease Father   . Stroke Father   . Mental illness Father   . Mental illness Sister    No Known Allergies Prior to Admission medications   Medication Sig Start Date End Date Taking? Authorizing Provider  amphetamine-dextroamphetamine (ADDERALL XR) 15 MG 24 hr capsule Take 1 capsule by mouth every morning. Fill on 07/13/15 06/13/15  Yes Tamyah Cutbirth P Sharnee Douglass, DO  amphetamine-dextroamphetamine (ADDERALL XR) 15 MG 24 hr capsule Take 1 capsule by mouth every morning. 06/13/15  Yes Ronita Hargreaves P Aneyah Lortz, DO  amphetamine-dextroamphetamine (ADDERALL XR) 15 MG 24 hr capsule Take 1 capsule by mouth every morning. 06/13/15  Yes Aren Pryde P Lovie Agresta, DO  amphetamine-dextroamphetamine (ADDERALL XR) 15 MG 24 hr capsule Take 1 capsule by mouth every morning. 06/13/15  Yes Mollyann Halbert P Shaylee Stanislawski, DO  buPROPion (WELLBUTRIN SR) 150 MG 12 hr tablet TAKE 1 TABLET BY MOUTH BID 06/13/15  Yes Sahory Nordling P Dontrez Pettis, DO  estrogen-methylTESTOSTERone (EST ESTROGENS-METHYLTEST HS) 0.625-1.25 MG per tablet TAKE 1  TABLET BY MOUTH ONCE DAILY AT BEDTIME. 06/13/15  Yes Whitleigh Garramone P Govani Radloff, DO  lisinopril (PRINIVIL,ZESTRIL) 10 MG tablet Take 1 tablet (10 mg total) by mouth daily. 09/17/15  Yes Jaynee Eagles, PA-C  zolpidem (AMBIEN) 5 MG tablet Take 7.5 mg tablets PO qhs prn 06/13/15  Yes Sydny Schnitzler P Rosaelena Kemnitz, DO     ROS: The patient denies fevers, chills, night sweats, unintentional weight loss, chest pain, palpitations, wheezing, dyspnea on exertion, nausea, vomiting, abdominal pain, dysuria, hematuria, melena, numbness, weakness, or tingling.   All other systems have been reviewed and were otherwise negative with the exception of those mentioned in the HPI and as above.    PHYSICAL EXAM: Filed Vitals:   09/19/15 1831  BP: 130/90  Pulse: 66  Temp: 98.1 F (36.7 C)  Resp: 18   Body mass index is 29.34 kg/(m^2).   General: Alert, no acute distress HEENT:  Normocephalic, atraumatic, oropharynx  patent. EOMI, PERRLA, fundo exam normal  Cardiovascular:  Regular rate and rhythm, no rubs murmurs or gallops.  No Carotid bruits, radial pulse intact. No pedal edema.  Respiratory: Clear to auscultation bilaterally.  No wheezes, rales, or rhonchi.  No cyanosis, no use of accessory musculature Abdominal: No organomegaly, abdomen is soft and non-tender, positive bowel sounds. No masses. Skin: No rashes. Neurologic: Facial musculature symmetric. CN 2-12 grossly normal . Cerebellar fxn normal Psychiatric: Patient acts appropriately throughout our interaction. Lymphatic: No cervical or submandibular lymphadenopathy Musculoskeletal: Gait intact. No edema, tenderness   LABS: Results for orders placed or performed in visit on 09/19/15  COMPLETE METABOLIC PANEL WITH GFR  Result Value Ref Range   Sodium 135 135 - 146 mmol/L   Potassium 4.6 3.5 - 5.3 mmol/L   Chloride 100 98 - 110 mmol/L   CO2 29 20 - 31 mmol/L   Glucose, Bld 87 65 - 99 mg/dL   BUN 16 7 - 25 mg/dL   Creat 1.17 (H) 0.50 - 1.10 mg/dL   Total Bilirubin 0.4 0.2 - 1.2 mg/dL   Alkaline Phosphatase 64 33 - 115 U/L   AST 20 10 - 35 U/L   ALT 33 (H) 6 - 29 U/L   Total Protein 6.9 6.1 - 8.1 g/dL   Albumin 4.4 3.6 - 5.1 g/dL   Calcium 9.9 8.6 - 10.2 mg/dL   GFR, Est African American 64 >=60 mL/min   GFR, Est Non African American 56 (L) >=60 mL/min  Lipid panel  Result Value Ref Range   Cholesterol 152 125 - 200 mg/dL   Triglycerides 90 <150 mg/dL   HDL 38 (L) >=46 mg/dL   Total CHOL/HDL Ratio 4.0 <=5.0 Ratio   VLDL 18 <30 mg/dL   LDL Cholesterol 96 <130 mg/dL  POCT CBC  Result Value Ref Range   WBC 7.5 4.6 - 10.2 K/uL   Lymph, poc 3.1 0.6 - 3.4   POC LYMPH PERCENT 41.1 10 - 50 %L   MID (cbc) 0.6 0 - 0.9   POC MID % 8.0 0 - 12 %M   POC Granulocyte 3.8 2 - 6.9   Granulocyte percent 50.9 37 - 80 %G   RBC 4.86 4.04 - 5.48 M/uL   Hemoglobin 14.2 12.2 - 16.2 g/dL   HCT, POC 44.2 37.7 - 47.9 %   MCV 90.9 80 - 97 fL   MCH, POC  29.1 27 - 31.2 pg   MCHC 32.0 31.8 - 35.4 g/dL   RDW, POC 12.6 %   Platelet Count, POC 253 142 -  424 K/uL   MPV 6.4 0 - 99.8 fL     EKG/XRAY:   Primary read interpreted by Dr. Marin Comment at [name].   ASSESSMENT/PLAN: Encounter Diagnoses  Name Primary?  . Essential hypertension Yes  . Dizziness and giddiness   . Screening for cholesterol level    HCTZ 12. 5 mg  DC lisinopril  Measure bp , precautions given for new sxs to go to ER Labs pending  Gross sideeffects, risk and benefits, and alternatives of medications d/w patient. Patient is aware that all medications have potential sideeffects and we are unable to predict every sideeffect or drug-drug interaction that may occur.  Xena Propst DO  09/22/2015 12:18 PM

## 2015-09-20 ENCOUNTER — Encounter: Payer: Self-pay | Admitting: Family Medicine

## 2015-09-20 LAB — COMPLETE METABOLIC PANEL WITH GFR
ALT: 33 U/L — ABNORMAL HIGH (ref 6–29)
AST: 20 U/L (ref 10–35)
Albumin: 4.4 g/dL (ref 3.6–5.1)
Alkaline Phosphatase: 64 U/L (ref 33–115)
BUN: 16 mg/dL (ref 7–25)
CO2: 29 mmol/L (ref 20–31)
Calcium: 9.9 mg/dL (ref 8.6–10.2)
Chloride: 100 mmol/L (ref 98–110)
Creat: 1.17 mg/dL — ABNORMAL HIGH (ref 0.50–1.10)
GFR, Est African American: 64 mL/min (ref >=60)
GFR, Est Non African American: 56 mL/min — ABNORMAL LOW (ref >=60)
Glucose, Bld: 87 mg/dL (ref 65–99)
Potassium: 4.6 mmol/L (ref 3.5–5.3)
Sodium: 135 mmol/L (ref 135–146)
Total Bilirubin: 0.4 mg/dL (ref 0.2–1.2)
Total Protein: 6.9 g/dL (ref 6.1–8.1)

## 2015-09-20 LAB — LIPID PANEL
Cholesterol: 152 mg/dL (ref 125–200)
HDL: 38 mg/dL — ABNORMAL LOW (ref >=46)
LDL Cholesterol: 96 mg/dL (ref <130)
Total CHOL/HDL Ratio: 4 Ratio (ref <=5.0)
Triglycerides: 90 mg/dL (ref <150)
VLDL: 18 mg/dL (ref <30)

## 2015-09-24 ENCOUNTER — Telehealth: Payer: Self-pay | Admitting: Family Medicine

## 2015-09-24 ENCOUNTER — Encounter: Payer: Self-pay | Admitting: Family Medicine

## 2015-09-24 DIAGNOSIS — I10 Essential (primary) hypertension: Secondary | ICD-10-CM

## 2015-09-24 DIAGNOSIS — R42 Dizziness and giddiness: Secondary | ICD-10-CM

## 2015-09-24 DIAGNOSIS — R110 Nausea: Secondary | ICD-10-CM

## 2015-09-24 NOTE — Telephone Encounter (Signed)
She has been taking the hctz 25 mg daily instead of 1/2 tab daily. Has not been feeling well. Nauseated, dizzy. Was feeling that way on lisinopril 10 mg so we changed it to hctz. Feels better after fast walking and also yoga. I am going to refer her to cardiologyfor stress testing to see if all is ok.

## 2015-10-01 ENCOUNTER — Encounter: Payer: Self-pay | Admitting: Cardiology

## 2015-10-01 ENCOUNTER — Ambulatory Visit (INDEPENDENT_AMBULATORY_CARE_PROVIDER_SITE_OTHER): Payer: 59 | Admitting: Cardiology

## 2015-10-01 VITALS — BP 140/90 | HR 81 | Ht 64.0 in | Wt 170.2 lb

## 2015-10-01 DIAGNOSIS — I10 Essential (primary) hypertension: Secondary | ICD-10-CM | POA: Diagnosis not present

## 2015-10-01 DIAGNOSIS — R110 Nausea: Secondary | ICD-10-CM | POA: Diagnosis not present

## 2015-10-01 DIAGNOSIS — R42 Dizziness and giddiness: Secondary | ICD-10-CM | POA: Diagnosis not present

## 2015-10-01 NOTE — Patient Instructions (Signed)
Medication Instructions:  The current medical regimen is effective;  continue present plan and medications.  Follow-Up: Follow up in 3 months with Dr Skains.  Thank you for choosing [facility]!!       

## 2015-10-01 NOTE — Progress Notes (Signed)
Cardiology Office Note   Date:  10/01/2015   ID:  [name] [name], DOB Aug 30, 1966, MRN [id number]  PCP:  Leotis Pain, DO  Cardiologist:   Candee Furbish, MD       History of Present Illness: [name] [name] is a 50 y.o. female who presents for evaluation of stress test in the setting of nausea, dizziness with hypertension. In review of prior telephone notes, she originally has trouble with lisinopril 10 mg once a day feeling some dizziness and nausea. She was then changed over to HCTZ 25 mg once a day. She felt similarly with that dose but now on the half of a tablet she is doing better. Her blood pressures are improved but still not under optimal control. She understands that this may take time and further medication.    Her hypertension was diagnosed at the benefit failure at the hospital. She works at the [employer]. Her diastolic she states was quite high in the 100s. They were worried. Dr. Marin Comment.  Lisinopril tried for 2 days at noon felt dizzy and sick.   Been on HCTZ for 10 days now. Feels better. Talking half of HCTZ and doing better.   Brother had CVA at 65. Her brother was concerned about her especially when she told him about her high blood pressure. Mother 93 HTN OK. Father had heart issues. Died 81.  Walks fast. She is not having any anginal symptoms of chest pain, shortness of breath. No syncope, no bleeding, no orthopnea, no headaches, no visual disturbances.  No smoking.   She does take Adderall as well as Wellbutrin.    Past Medical History  Diagnosis Date  . Depression   . Interstitial cystitis   . Cervical radiculopathy 2014    L arm (into L4th and 5th fingers)  . Anxiety   . ADD (attention deficit disorder)     Past Surgical History  Procedure Laterality Date  . Knee surgery      both knees  . Breast reduction surgery      age 73yo  . Bladder surgery      due to interstitial cystitis  . Abdominal hysterectomy      complete, benign pathology      Current Outpatient Prescriptions  Medication Sig Dispense Refill  . amphetamine-dextroamphetamine (ADDERALL XR) 15 MG 24 hr capsule Take 1 capsule by mouth every morning. 30 capsule 0  . buPROPion (WELLBUTRIN SR) 150 MG 12 hr tablet TAKE 1 TABLET BY MOUTH BID 180 tablet 3  . estrogen-methylTESTOSTERone (EST ESTROGENS-METHYLTEST HS) 0.625-1.25 MG per tablet TAKE 1 TABLET BY MOUTH ONCE DAILY AT BEDTIME. 30 tablet 5  . hydrochlorothiazide (HYDRODIURIL) 25 MG tablet Take 1/2 tab po daily 30 tablet 0  . zolpidem (AMBIEN) 5 MG tablet Take 7.5 mg tablets PO qhs prn 45 tablet 3   No current facility-administered medications for this visit.    Allergies:   Review of patient's allergies indicates no known allergies.    Social History:  The patient  reports that she quit smoking about 25 years ago. She has never used smokeless tobacco. She reports that she drinks alcohol. She reports that she does not use illicit drugs.   Family History:  The patient's family history includes Diabetes in her brother; Heart disease in her father; Hyperlipidemia in her brother; Mental illness in her father and sister; Stroke in her brother and father.    ROS:  Please see the history of present illness.   Otherwise,  review of systems are positive for none.   All other systems are reviewed and negative.    PHYSICAL EXAM: VS:  BP 140/90 mmHg  Pulse 81  Ht 5\' 4"  (1.626 m)  Wt 170 lb 3.2 oz (77.202 kg)  BMI 29.20 kg/m2 , BMI Body mass index is 29.2 kg/(m^2). GEN: Well nourished, well developed, in no acute distress HEENT: normal Neck: no JVD, carotid bruits, or masses Cardiac: RRR; no murmurs, rubs, or gallops,no edema  Respiratory:  clear to auscultation bilaterally, normal work of breathing GI: soft, nontender, nondistended, + BS MS: no deformity or atrophy Skin: warm and dry, no rash Neuro:  Strength and sensation are intact Psych: euthymic mood, full affect   EKG:  Today 10/01/15-sinus rhythm, 81,  no other abnormalities.   Recent Labs: 09/19/2015: ALT 33*; BUN 16; Creat 1.17*; Hemoglobin 14.2; Potassium 4.6; Sodium 135    Lipid Panel    Component Value Date/Time   CHOL 152 09/19/2015 1938   TRIG 90 09/19/2015 1938   HDL 38* 09/19/2015 1938   CHOLHDL 4.0 09/19/2015 1938   VLDL 18 09/19/2015 1938   LDLCALC 96 09/19/2015 1938   LDLDIRECT 103* 08/22/2014 2229      Wt Readings from Last 3 Encounters:  10/01/15 170 lb 3.2 oz (77.202 kg)  09/19/15 171 lb (77.565 kg)  09/17/15 170 lb (77.111 kg)      Other studies Reviewed: Additional studies/ records that were reviewed today include: Prior lab work reviewed, prior office notes reviewed. Review of the above records demonstrates: As above   ASSESSMENT AND PLAN:  1.  Dizziness, nausea, hypertension uncontrolled  - She is currently working hard with Dr. Marin Comment to find a blood pressure regimen that works for her. Currently HCTZ 12.5 mg is being utilized and has improved her blood pressure but she understands that she may need a secondary agent. Perhaps, taking a lower dose of the lisinopril or a low dose of angiotensin receptor blocker with the HCTZ may be helpful. Low-dose amlodipine may be an option as well. She understands the importance of hypertension control especially with her brother who had a stroke. Hypertension could lead to MI/stroke. Creatinine is 1.17. Potassium 4.6, reassuring. LDL 96. Would recommend checking a TSH if that has not been done recently.  - Overall her symptom of occasional dizziness/nausea are fairly nonspecific for cardiac disease. Her EKG appears normal. I discussed that I would like for her to optimize her blood pressure prior to proceeding with exercise treadmill test. Because of this, I will reevaluate her in 3 months and decide whether or not we are ready to proceed with testing. Of course if symptoms worsen or become more worrisome we may proceed earlier. #1 goal at this point is to control blood  pressure.  2. Attention deficit disorder  - Would consider decreasing or stopping Adderall in the setting of hypertension given its stimulant properties and risk for worsening blood pressure however the risks versus benefit of this judgment must be weighed based upon her current psychiatric status  Current medicines are reviewed at length with the patient today.  The patient does not have concerns regarding medicines.  The following changes have been made:  no change  Labs/ tests ordered today include: none  No orders of the defined types were placed in this encounter.     Disposition:   FU with Skains in 3 months  Signed, Candee Furbish, MD  10/01/2015 11:39 AM    [facility] [address] [place], [place]  [address] Phone: [phone]; Fax: [phone]

## 2015-10-04 ENCOUNTER — Other Ambulatory Visit: Payer: Self-pay | Admitting: Family Medicine

## 2015-10-04 DIAGNOSIS — IMO0001 Reserved for inherently not codable concepts without codable children: Secondary | ICD-10-CM

## 2015-10-04 DIAGNOSIS — R030 Elevated blood-pressure reading, without diagnosis of hypertension: Principal | ICD-10-CM

## 2015-10-11 ENCOUNTER — Encounter: Payer: Self-pay | Admitting: Family Medicine

## 2015-10-21 ENCOUNTER — Other Ambulatory Visit: Payer: Self-pay | Admitting: Family Medicine

## 2015-10-21 ENCOUNTER — Other Ambulatory Visit (INDEPENDENT_AMBULATORY_CARE_PROVIDER_SITE_OTHER): Payer: 59 | Admitting: *Deleted

## 2015-10-21 DIAGNOSIS — R030 Elevated blood-pressure reading, without diagnosis of hypertension: Secondary | ICD-10-CM

## 2015-10-21 DIAGNOSIS — IMO0001 Reserved for inherently not codable concepts without codable children: Secondary | ICD-10-CM

## 2015-10-21 LAB — TSH: TSH: 2.112 u[IU]/mL (ref 0.350–4.500)

## 2015-10-23 LAB — BASIC METABOLIC PANEL
BUN: 13 mg/dL (ref 7–25)
CO2: 20 mmol/L (ref 20–31)
Calcium: 8.7 mg/dL (ref 8.6–10.2)
Chloride: 103 mmol/L (ref 98–110)
Creat: 1.12 mg/dL — ABNORMAL HIGH (ref 0.50–1.10)
Glucose, Bld: 88 mg/dL (ref 65–99)
Potassium: 3.8 mmol/L (ref 3.5–5.3)
Sodium: 141 mmol/L (ref 135–146)

## 2015-10-25 ENCOUNTER — Encounter: Payer: Self-pay | Admitting: Family Medicine

## 2015-10-29 ENCOUNTER — Other Ambulatory Visit: Payer: Self-pay | Admitting: Family Medicine

## 2015-10-30 ENCOUNTER — Other Ambulatory Visit: Payer: Self-pay | Admitting: Family Medicine

## 2015-10-30 DIAGNOSIS — Z800 Family history of malignant neoplasm of digestive organs: Secondary | ICD-10-CM

## 2015-10-30 DIAGNOSIS — R109 Unspecified abdominal pain: Secondary | ICD-10-CM

## 2015-10-31 ENCOUNTER — Telehealth: Payer: Self-pay | Admitting: Family Medicine

## 2015-10-31 ENCOUNTER — Other Ambulatory Visit: Payer: Self-pay | Admitting: Family Medicine

## 2015-10-31 MED ORDER — LUBIPROSTONE 8 MCG PO CAPS: 8.0000 ug | ORAL_CAPSULE | Freq: Two times a day (BID) | ORAL | Status: DC

## 2015-10-31 NOTE — Telephone Encounter (Signed)
Called pt and LMOM to CB. Need more information. How is she doing on 1/2 tab HCTZ BID? She was feeling nauseated and dizzy before, how is she feeling now? Is she checking her BP at home and if so, what is her BP running? Can change the rx if she is doing well.

## 2015-10-31 NOTE — Telephone Encounter (Signed)
Pt is in need of BP Rx (unsure of rx name)-- it was originally issued for 1/2 tablet once a day and rx was adjusted to 1/2 tablet twice a day/// due to quantity limit pharm will not fill- pt needs rx adjustment to been sent to pharmacy.  [employer].   [phone]

## 2015-11-01 ENCOUNTER — Other Ambulatory Visit: Payer: Self-pay | Admitting: Family Medicine

## 2015-11-01 ENCOUNTER — Encounter: Payer: Self-pay | Admitting: Gastroenterology

## 2015-11-01 MED ORDER — HYDROCHLOROTHIAZIDE 25 MG PO TABS
ORAL_TABLET | ORAL | Status: DC
Start: 2015-11-01 — End: 2016-04-25

## 2015-11-01 NOTE — Telephone Encounter (Signed)
Called pt at work, was put ion hold. Will try again later.

## 2015-11-01 NOTE — Telephone Encounter (Signed)
Dr Marin Comment fixed this Rx today.

## 2015-11-01 NOTE — Telephone Encounter (Signed)
Patient states that the prescription was sent in wrong again. She states that it's supposed to be fore 1/2 tablet twice a day. Patient also stated that if it doesn't get sent correctly today she will get sick while she's working. Please call patient at work! [phone]

## 2015-11-27 ENCOUNTER — Ambulatory Visit (INDEPENDENT_AMBULATORY_CARE_PROVIDER_SITE_OTHER): Payer: 59 | Admitting: Gastroenterology

## 2015-11-27 ENCOUNTER — Encounter: Payer: Self-pay | Admitting: Gastroenterology

## 2015-11-27 VITALS — BP 120/92 | HR 76 | Ht 64.0 in | Wt 169.0 lb

## 2015-11-27 DIAGNOSIS — Z800 Family history of malignant neoplasm of digestive organs: Secondary | ICD-10-CM | POA: Diagnosis not present

## 2015-11-27 DIAGNOSIS — K5900 Constipation, unspecified: Secondary | ICD-10-CM

## 2015-11-27 DIAGNOSIS — M545 Low back pain: Secondary | ICD-10-CM

## 2015-11-27 DIAGNOSIS — K625 Hemorrhage of anus and rectum: Secondary | ICD-10-CM

## 2015-11-27 MED ORDER — NA SULFATE-K SULFATE-MG SULF 17.5-3.13-1.6 GM/177ML PO SOLN: 1.0000 | ORAL | Status: AC

## 2015-11-27 NOTE — Patient Instructions (Signed)
Please continue taking Amitiza.   You have been scheduled for a colonoscopy. Please follow written instructions given to you at your visit today.  Please pick up your prep supplies at the pharmacy within the next 1-3 days. If you use inhalers (even only as needed), please bring them with you on the day of your procedure. Your physician has requested that you go to www.startemmi.com and enter the access code given to you at your visit today. This web site gives a general overview about your procedure. However, you should still follow specific instructions given to you by our office regarding your preparation for the procedure.

## 2015-11-28 ENCOUNTER — Telehealth: Payer: Self-pay | Admitting: Gastroenterology

## 2015-11-28 DIAGNOSIS — M545 Low back pain, unspecified: Secondary | ICD-10-CM | POA: Insufficient documentation

## 2015-11-28 DIAGNOSIS — K625 Hemorrhage of anus and rectum: Secondary | ICD-10-CM | POA: Insufficient documentation

## 2015-11-28 DIAGNOSIS — K5900 Constipation, unspecified: Secondary | ICD-10-CM | POA: Insufficient documentation

## 2015-11-28 DIAGNOSIS — Z800 Family history of malignant neoplasm of digestive organs: Secondary | ICD-10-CM | POA: Insufficient documentation

## 2015-11-28 NOTE — Telephone Encounter (Signed)
Spoke to patient. She will come by office and pick up sample of Suprep. Patient informed and verbalized understanding. Called pharmacy and cancelled RX.

## 2015-11-28 NOTE — Progress Notes (Signed)
11/28/2015 [name] [name] [id number] 1966-01-22   HISTORY OF PRESENT ILLNESS:  This is a 49 year old female who is new to our practice. She has past history of depression, interstitial cystitis, cervical radiculopathy, anxiety, and ADHD.  She works at [employer] at [employer].  She presents to our office today with complaints of right sided low back/SI pain.  Referred by PCP, Dr. Marin Comment.  No associated abdominal pain.  This has been present for over a year and says that it is constant pain.  Had an abdominal x-ray in 12/2014 that showed moderate stool throughout the colon.  Has Amitiza 8 mcg BID on her medication list, but does not take it because she claims that she did not think she was constipated, however, then she says that a lot of times she feels like she has to have a bowel movement but cannot go. She does take a lot of medicines that can worsen constipation.  Sees occasional bright red blood when she strains to have a BM.  Says that she has a family history of colon cancer in a grandmother and an uncle.  Also says that her mother has history of several polyps.  TSH, CMP, CBC ok fairly recently.   Past Medical History  Diagnosis Date  . Depression   . Interstitial cystitis   . Cervical radiculopathy 2014    L arm (into L4th and 5th fingers)  . Anxiety   . ADD (attention deficit disorder)    Past Surgical History  Procedure Laterality Date  . Knee surgery      both knees  . Breast reduction surgery      age 35yo  . Bladder surgery      due to interstitial cystitis  . Abdominal hysterectomy      complete, benign pathology    reports that she quit smoking about 25 years ago. She has never used smokeless tobacco. She reports that she drinks alcohol. She reports that she does not use illicit drugs. family history includes Diabetes in her brother; Heart disease in her father; Hyperlipidemia in her brother; Mental illness in her father and sister; Stroke in her  brother and father. No Known Allergies    Outpatient Encounter Prescriptions as of 11/27/2015  Medication Sig  . amphetamine-dextroamphetamine (ADDERALL XR) 15 MG 24 hr capsule Take 1 capsule by mouth every morning.  Marland Kitchen buPROPion (WELLBUTRIN SR) 150 MG 12 hr tablet TAKE 1 TABLET BY MOUTH BID  . estrogen-methylTESTOSTERone (EST ESTROGENS-METHYLTEST HS) 0.625-1.25 MG per tablet TAKE 1 TABLET BY MOUTH ONCE DAILY AT BEDTIME.  . hydrochlorothiazide (HYDRODIURIL) 25 MG tablet TAKE 1/2 TABLET BY MOUTH BID  . lubiprostone (AMITIZA) 8 MCG capsule Take 1 capsule (8 mcg total) by mouth 2 (two) times daily with a meal.  . zolpidem (AMBIEN) 5 MG tablet Take 7.5 mg tablets PO qhs prn  . Na Sulfate-K Sulfate-Mg Sulf SOLN Take 1 kit by mouth as directed.   No facility-administered encounter medications on file as of 11/27/2015.     REVIEW OF SYSTEMS  : All other systems reviewed and negative except where noted in the History of Present Illness.   PHYSICAL EXAM: BP 120/92 mmHg  Pulse 76  Ht 5' 4"  (1.626 m)  Wt 169 lb (76.658 kg)  BMI 28.99 kg/m2 General: Well developed white female in no acute distress Head: Normocephalic and atraumatic Eyes:  Sclerae anicteric, conjunctiva pink. Ears: Normal auditory acuity Lungs: Clear throughout to auscultation Heart: Regular rate and  rhythm Abdomen: Soft, non-distended.  Normal bowel sounds.  Non-tender. Rectal:  Will be done at the time of colonoscopy. Musculoskeletal: Symmetrical with no gross deformities  Skin: No lesions on visible extremities Extremities: No edema  Neurological: Alert oriented x 4, grossly non-focal Psychological:  Alert and cooperative. Normal mood and affect  ASSESSMENT AND PLAN: -49 year old female presenting with primary complaint of right sided low back/SI pain.  Without associated abdominal pain I doubt that this is GI related and is likely musculoskeletal. Possible improvement with moving her bowels at times and she does have  constipation, so would recommend taking MiraLAX daily or her prescribed Amitiza twice daily.   -Family history of colon cancer in grandmother and uncle as well has mother with several polyps.  *Will proceed with colonoscopy, however, I explained to the patient that we will likely not find a cause of her back pain. If her pain is at all related to constipation, then the solution is to treat the constipation itself, however, I doubt that this is the case as wel. Sounds musculoskeletal.  The risks, benefits, and alternatives to colonoscopy were discussed with the patient and she consents to proceed.    CC:  Le, Thao P, DO

## 2015-11-29 NOTE — Progress Notes (Signed)
Reviewed and agree with documentation and assessment and plan. K. Veena Nandigam , MD   

## 2015-12-11 ENCOUNTER — Other Ambulatory Visit: Payer: Self-pay | Admitting: Family Medicine

## 2015-12-12 NOTE — Telephone Encounter (Signed)
Called in.

## 2015-12-13 ENCOUNTER — Telehealth: Payer: Self-pay | Admitting: *Deleted

## 2015-12-13 DIAGNOSIS — F341 Dysthymic disorder: Secondary | ICD-10-CM

## 2015-12-13 DIAGNOSIS — F988 Other specified behavioral and emotional disorders with onset usually occurring in childhood and adolescence: Secondary | ICD-10-CM

## 2015-12-13 NOTE — Telephone Encounter (Signed)
PHONED IN.

## 2015-12-14 ENCOUNTER — Other Ambulatory Visit: Payer: Self-pay

## 2015-12-14 DIAGNOSIS — Z1231 Encounter for screening mammogram for malignant neoplasm of breast: Secondary | ICD-10-CM

## 2015-12-14 DIAGNOSIS — Z9889 Other specified postprocedural states: Secondary | ICD-10-CM

## 2015-12-25 MED ORDER — EST ESTROGENS-METHYLTEST 0.625-1.25 MG PO TABS: ORAL_TABLET | ORAL | Status: DC

## 2015-12-25 MED FILL — EEMT HS 0.625-1.25 MG TAB: 0.625-1.25 | 30 days supply | Qty: 30 | Fill #0 | Status: TO

## 2015-12-25 NOTE — Telephone Encounter (Signed)
Left message Rx sent in. 

## 2015-12-25 NOTE — Telephone Encounter (Signed)
Rx called in 

## 2015-12-25 NOTE — Telephone Encounter (Signed)
Pt called req. Refill on hormone rx... Did advise of potential ov requirement and Dr. Gus Puma walk-in hours.  [phone]

## 2015-12-26 ENCOUNTER — Ambulatory Visit (INDEPENDENT_AMBULATORY_CARE_PROVIDER_SITE_OTHER): Payer: 59 | Admitting: Family Medicine

## 2015-12-26 ENCOUNTER — Encounter: Payer: Self-pay | Admitting: Family Medicine

## 2015-12-26 VITALS — BP 144/92 | HR 84 | Temp 98.4°F | Resp 16 | Ht 64.75 in | Wt 170.6 lb

## 2015-12-26 DIAGNOSIS — J3489 Other specified disorders of nose and nasal sinuses: Secondary | ICD-10-CM | POA: Diagnosis not present

## 2015-12-26 DIAGNOSIS — I10 Essential (primary) hypertension: Secondary | ICD-10-CM | POA: Diagnosis not present

## 2015-12-26 DIAGNOSIS — R5381 Other malaise: Secondary | ICD-10-CM

## 2015-12-26 LAB — POCT INFLUENZA A/B
Influenza A, POC: NEGATIVE
Influenza B, POC: NEGATIVE

## 2015-12-26 LAB — POCT CBC
Granulocyte percent: 66.6 %G (ref 37–80)
HCT, POC: 41.1 % (ref 37.7–47.9)
Hemoglobin: 14.5 g/dL (ref 12.2–16.2)
Lymph, poc: 2 (ref 0.6–3.4)
MCH, POC: 31.3 pg — AB (ref 27–31.2)
MCHC: 35.2 g/dL (ref 31.8–35.4)
MCV: 88.8 fL (ref 80–97)
MID (cbc): 0.4 (ref 0–0.9)
MPV: 6.2 fL (ref 0–99.8)
POC Granulocyte: 4.8 (ref 2–6.9)
POC LYMPH PERCENT: 28.3 %L (ref 10–50)
POC MID %: 5.1 %M (ref 0–12)
Platelet Count, POC: 215 10*3/uL (ref 142–424)
RBC: 4.62 M/uL (ref 4.04–5.48)
RDW, POC: 12 %
WBC: 7.2 10*3/uL (ref 4.6–10.2)

## 2015-12-26 MED ORDER — ACETAMINOPHEN 325 MG PO TABS: 1000.0000 mg | ORAL_TABLET | Freq: Once | ORAL | Status: DC

## 2015-12-26 NOTE — Patient Instructions (Signed)
It appears that you have a viral illness.   Tylenol is ok, and so is plain mucinex for your symptoms  Your BP is a bit high- please try to check it a couple of times a week.  If you are consistently getting numbers higher than 140/90 please alert Dr. Marin Comment  Let me know if you are not feeling better over the next 3-4 days

## 2015-12-26 NOTE — Progress Notes (Signed)
[facility] [address] [place] [phone]  Date:  12/26/2015   Name:  [name] [name]   DOB:  Nov 06, 1966   MRN:  [id number]  PCP:  Leotis Pain, DO    Chief Complaint: Sore Throat; Headache; and Otalgia   History of Present Illness:  [name] [name] is a 50 y.o. very pleasant female patient who presents with the following:  Here today whit illness.  Yesterday she noted onset of a ST, stuffy nose, pressure in her ears She has not noted any pain with chewing or any tooth pain but she does feel a pain in her right upper gums She is not aware of any fever She does have some hot flashes so it can be hard to judge if any fever She does notice some body aches No cough  No GI symptoms Her husband was ill recently but she is not sure if his sx were just due to smoking She did not take any meds at home so far.    History of HTN treated by Dr. Vladimir Faster that she has not been following her BP at home as she should  BP Readings from Last 3 Encounters:  12/26/15 144/92  11/27/15 120/92  10/01/15 140/90     Patient Active Problem List   Diagnosis Date Noted  . Constipation 11/28/2015  . Family history of colon cancer 11/28/2015  . Rectal bleeding 11/28/2015  . Low back pain 11/28/2015  . Dysthymia 08/04/2012  . ADD (attention deficit disorder) 08/04/2012  . Sleep disturbance 08/04/2012  . IC (interstitial cystitis) 08/04/2012  . H/O hysterectomy for benign disease 04/07/2012    Past Medical History  Diagnosis Date  . Depression   . Interstitial cystitis   . Cervical radiculopathy 2014    L arm (into L4th and 5th fingers)  . Anxiety   . ADD (attention deficit disorder)     Past Surgical History  Procedure Laterality Date  . Knee surgery      both knees  . Breast reduction surgery      age 67yo  . Bladder surgery      due to interstitial cystitis  . Abdominal hysterectomy      complete, benign pathology    Social History   Substance Use Topics  . Smoking status: Former Smoker    Quit date: 08/04/1990  . Smokeless tobacco: Never Used  . Alcohol Use: Yes     Comment: rare    Family History  Problem Relation Age of Onset  . Stroke Brother   . Diabetes Brother   . Hyperlipidemia Brother   . Heart disease Father   . Stroke Father   . Mental illness Father   . Mental illness Sister     No Known Allergies  Medication list has been reviewed and updated.  Current Outpatient Prescriptions on File Prior to Visit  Medication Sig Dispense Refill  . amphetamine-dextroamphetamine (ADDERALL XR) 15 MG 24 hr capsule Take 1 capsule by mouth every morning. 30 capsule 0  . buPROPion (WELLBUTRIN SR) 150 MG 12 hr tablet TAKE 1 TABLET BY MOUTH BID 180 tablet 3  . estrogen-methylTESTOSTERone (EST ESTROGENS-METHYLTEST HS) 0.625-1.25 MG tablet TAKE 1 TABLET BY MOUTH ONCE DAILY AT BEDTIME. 30 tablet 5  . hydrochlorothiazide (HYDRODIURIL) 25 MG tablet TAKE 1/2 TABLET BY MOUTH BID 30 tablet 5  . zolpidem (AMBIEN) 5 MG tablet TAKE 1 AND 1/2 TABLETS BY MOUTH AT BEDTIME AS NEEDED 45  tablet 0  . lubiprostone (AMITIZA) 8 MCG capsule Take 1 capsule (8 mcg total) by mouth 2 (two) times daily with a meal. (Patient not taking: Reported on 12/26/2015) 30 capsule 0  . Na Sulfate-K Sulfate-Mg Sulf SOLN Take 1 kit by mouth as directed. (Patient not taking: Reported on 12/26/2015) 354 mL 0   No current facility-administered medications on file prior to visit.    Review of Systems:  As per HPI- otherwise negative.   Physical Examination: Filed Vitals:   12/26/15 1056 12/26/15 1057  BP: 155/101 144/92  Pulse: 84   Temp: 98.4 F (36.9 C)   Resp: 16    Filed Vitals:   12/26/15 1056  Height: 5' 4.75" (1.645 m)  Weight: 170 lb 9.6 oz (77.384 kg)   Body mass index is 28.6 kg/(m^2). Ideal Body Weight: Weight in (lb) to have BMI = 25: 148.8  GEN: WDWN, NAD, Non-toxic, A & O x 3, mild overweight, looks well HEENT: Atraumatic,  Normocephalic. Neck supple. No masses, No LAD.  Bilateral TM wnl but she does have some clear fluid on the left only, oropharynx normal.  PEERL,EOMI.   Ears and Nose: No external deformity. CV: RRR, No M/G/R. No JVD. No thrill. No extra heart sounds. PULM: CTA B, no wheezes, crackles, rhonchi. No retractions. No resp. distress. No accessory muscle use. EXTR: No c/c/e NEURO Normal gait.  PSYCH: Normally interactive. Conversant. Not depressed or anxious appearing.  Calm demeanor.   Results for orders placed or performed in visit on 12/26/15  POCT Influenza A/B  Result Value Ref Range   Influenza A, POC Negative Negative   Influenza B, POC Negative Negative  POCT CBC  Result Value Ref Range   WBC 7.2 4.6 - 10.2 K/uL   Lymph, poc 2.0 0.6 - 3.4   POC LYMPH PERCENT 28.3 10 - 50 %L   MID (cbc) 0.4 0 - 0.9   POC MID % 5.1 0 - 12 %M   POC Granulocyte 4.8 2 - 6.9   Granulocyte percent 66.6 37 - 80 %G   RBC 4.62 4.04 - 5.48 M/uL   Hemoglobin 14.5 12.2 - 16.2 g/dL   HCT, POC 41.1 37.7 - 47.9 %   MCV 88.8 80 - 97 fL   MCH, POC 31.3 (A) 27 - 31.2 pg   MCHC 35.2 31.8 - 35.4 g/dL   RDW, POC 12.0 %   Platelet Count, POC 215 142 - 424 K/uL   MPV 6.2 0 - 99.8 fL     Assessment and Plan: Frontal sinus pain - Plan: POCT CBC  Malaise - Plan: POCT Influenza A/B, acetaminophen (TYLENOL) tablet 975 mg  Essential hypertension  Likely viral URI Discussed supportive and symptomatic care Encouraged her to follow her BP at home Given some samples of plain mucinex and a dose of tylenol here   Signed Lamar Blinks, MD

## 2015-12-28 MED FILL — HYDROCHLOROTHIAZIDE 25 MG T: 25 | 30 days supply | Qty: 30 | Fill #2 | Status: TO

## 2016-01-09 ENCOUNTER — Ambulatory Visit
Admission: RE | Admit: 2016-01-09 | Discharge: 2016-01-09 | Disposition: A | Discharge status: Home / Self Care | Payer: 59 | Source: Ambulatory Visit

## 2016-01-09 DIAGNOSIS — Z9889 Other specified postprocedural states: Secondary | ICD-10-CM

## 2016-01-09 DIAGNOSIS — Z1231 Encounter for screening mammogram for malignant neoplasm of breast: Secondary | ICD-10-CM | POA: Diagnosis not present

## 2016-01-11 ENCOUNTER — Encounter: Payer: Self-pay | Admitting: Family Medicine

## 2016-01-13 ENCOUNTER — Other Ambulatory Visit: Payer: Self-pay | Admitting: Family Medicine

## 2016-01-13 DIAGNOSIS — F988 Other specified behavioral and emotional disorders with onset usually occurring in childhood and adolescence: Secondary | ICD-10-CM

## 2016-01-13 MED ORDER — AMPHETAMINE-DEXTROAMPHET ER 15 MG PO CP24: 15.0000 mg | ORAL_CAPSULE | ORAL | Status: DC

## 2016-01-13 MED ORDER — ZOLPIDEM TARTRATE 5 MG PO TABS: ORAL_TABLET | ORAL | Status: DC

## 2016-01-15 ENCOUNTER — Ambulatory Visit: Payer: 59 | Admitting: Cardiology

## 2016-01-16 ENCOUNTER — Other Ambulatory Visit: Payer: Self-pay | Admitting: Family Medicine

## 2016-01-16 MED ORDER — BENZONATATE 200 MG PO CAPS: 200.0000 mg | ORAL_CAPSULE | Freq: Three times a day (TID) | ORAL | Status: DC | PRN

## 2016-01-16 NOTE — Telephone Encounter (Signed)
Dr. Marin Comment,  Pt said another message saying she is going to see you next week. I figured if she is not feeling better by then she can see you regarding illness during visit.

## 2016-01-17 ENCOUNTER — Ambulatory Visit (AMBULATORY_SURGERY_CENTER): Payer: 59 | Admitting: Gastroenterology

## 2016-01-17 ENCOUNTER — Encounter: Payer: Self-pay | Admitting: Gastroenterology

## 2016-01-17 VITALS — BP 122/65 | HR 67 | Temp 98.0°F | Resp 28 | Ht 64.0 in | Wt 169.0 lb

## 2016-01-17 DIAGNOSIS — I10 Essential (primary) hypertension: Secondary | ICD-10-CM | POA: Diagnosis not present

## 2016-01-17 DIAGNOSIS — Z1211 Encounter for screening for malignant neoplasm of colon: Secondary | ICD-10-CM | POA: Diagnosis not present

## 2016-01-17 DIAGNOSIS — Z800 Family history of malignant neoplasm of digestive organs: Secondary | ICD-10-CM

## 2016-01-17 DIAGNOSIS — E669 Obesity, unspecified: Secondary | ICD-10-CM | POA: Diagnosis not present

## 2016-01-17 DIAGNOSIS — Z9889 Other specified postprocedural states: Secondary | ICD-10-CM

## 2016-01-17 DIAGNOSIS — F329 Major depressive disorder, single episode, unspecified: Secondary | ICD-10-CM | POA: Diagnosis not present

## 2016-01-17 MED ORDER — SODIUM CHLORIDE 0.9 % IV SOLN: 500.0000 mL | INTRAVENOUS | Status: DC

## 2016-01-17 NOTE — Patient Instructions (Signed)
Discharge instructions given. Normal exam. Resume previous medications. YOU HAD AN ENDOSCOPIC PROCEDURE TODAY AT THE [facility]:   Refer to the procedure report that was given to you for any specific questions about what was found during the examination.  If the procedure report does not answer your questions, please call your gastroenterologist to clarify.  If you requested that your care partner not be given the details of your procedure findings, then the procedure report has been included in a sealed envelope for you to review at your convenience later.  YOU SHOULD EXPECT: Some feelings of bloating in the abdomen. Passage of more gas than usual.  Walking can help get rid of the air that was put into your GI tract during the procedure and reduce the bloating. If you had a lower endoscopy (such as a colonoscopy or flexible sigmoidoscopy) you may notice spotting of blood in your stool or on the toilet paper. If you underwent a bowel prep for your procedure, you may not have a normal bowel movement for a few days.  Please Note:  You might notice some irritation and congestion in your nose or some drainage.  This is from the oxygen used during your procedure.  There is no need for concern and it should clear up in a day or so.  SYMPTOMS TO REPORT IMMEDIATELY:   Following lower endoscopy (colonoscopy or flexible sigmoidoscopy):  Excessive amounts of blood in the stool  Significant tenderness or worsening of abdominal pains  Swelling of the abdomen that is new, acute  Fever of 100F or higher   For urgent or emergent issues, a gastroenterologist can be reached at any hour by calling [phone].   DIET: Your first meal following the procedure should be a small meal and then it is ok to progress to your normal diet. Heavy or fried foods are harder to digest and may make you feel nauseous or bloated.  Likewise, meals heavy in dairy and vegetables can increase bloating.  Drink plenty  of fluids but you should avoid alcoholic beverages for 24 hours.  ACTIVITY:  You should plan to take it easy for the rest of today and you should NOT DRIVE or use heavy machinery until tomorrow (because of the sedation medicines used during the test).    FOLLOW UP: Our staff will call the number listed on your records the next business day following your procedure to check on you and address any questions or concerns that you may have regarding the information given to you following your procedure. If we do not reach you, we will leave a message.  However, if you are feeling well and you are not experiencing any problems, there is no need to return our call.  We will assume that you have returned to your regular daily activities without incident.  If any biopsies were taken you will be contacted by phone or by letter within the next 1-3 weeks.  Please call us at [phone] if you have not heard about the biopsies in 3 weeks.    SIGNATURES/CONFIDENTIALITY: You and/or your care partner have signed paperwork which will be entered into your electronic medical record.  These signatures attest to the fact that that the information above on your After Visit Summary has been reviewed and is understood.  Full responsibility of the confidentiality of this discharge information lies with you and/or your care-partner. 

## 2016-01-17 NOTE — Progress Notes (Signed)
To recovery, report to McCoy, RN, VSS 

## 2016-01-17 NOTE — Op Note (Signed)
[facility]  [address] [place], [id number]   COLONOSCOPY PROCEDURE REPORT  PATIENT: [name] [name], [name] [name] [name]  MR#: [id number] BIRTHDATE: February 27, 1966 , 70  yrs. old GENDER: female ENDOSCOPIST: Harl Bowie, MD REFERRED BY:Le, Cipriano Mile, DO PROCEDURE DATE:  01/17/2016 PROCEDURE:   Colonoscopy, screening First Screening Colonoscopy - Avg.  risk and is 50 yrs.  old or older Yes.  Prior Negative Screening - Now for repeat screening. N/A  History of Adenoma - Now for follow-up colonoscopy & has been > or = to 3 yrs.  N/A  Polyps removed today? No Recommend repeat exam, <10 yrs? No ASA CLASS:   Class II INDICATIONS:Screening for colonic neoplasia and FH Colon or Rectal Adenocarcinoma. MEDICATIONS: Propofol 500 mg IV  DESCRIPTION OF PROCEDURE:   After the risks benefits and alternatives of the procedure were thoroughly explained, informed consent was obtained.  The digital rectal exam revealed no abnormalities of the rectum.   The LB 1528  endoscope was introduced through the anus and advanced to the cecum, which was identified by both the appendix and ileocecal valve. No adverse events experienced.   The quality of the prep was adequate  The instrument was then slowly withdrawn as the colon was fully examined. Estimated blood loss is zero unless otherwise noted in this procedure report.   COLON FINDINGS: A normal appearing cecum, ileocecal valve, and appendiceal orifice were identified.  The ascending, transverse, descending, sigmoid colon, and rectum appeared unremarkable. Retroflexed views revealed internal hemorrhoids. The time to cecum = 9.5 Withdrawal time = 9.4   The scope was withdrawn and the procedure completed. COMPLICATIONS: There were no immediate complications.  ENDOSCOPIC IMPRESSION: Normal colonoscopy  RECOMMENDATIONS: Given your significant family history of colon cancer, you should have a repeat colonoscopy in 5 years  eSigned:  Harl Bowie, MD 01/17/2016 9:27 AM

## 2016-01-18 ENCOUNTER — Telehealth: Payer: Self-pay | Admitting: *Deleted

## 2016-01-18 NOTE — Telephone Encounter (Signed)
No answer, left message to call if questions or concerns. 

## 2016-01-22 MED FILL — EEMT HS 0.625-1.25 MG TAB: 0.625-1.25 | 30 days supply | Qty: 30 | Fill #0 | Status: TO

## 2016-01-26 ENCOUNTER — Encounter: Payer: Self-pay | Admitting: Family Medicine

## 2016-01-26 ENCOUNTER — Encounter: Payer: 59 | Admitting: Family Medicine

## 2016-01-27 ENCOUNTER — Other Ambulatory Visit: Payer: Self-pay | Admitting: Family Medicine

## 2016-01-27 DIAGNOSIS — F988 Other specified behavioral and emotional disorders with onset usually occurring in childhood and adolescence: Secondary | ICD-10-CM

## 2016-01-27 MED ORDER — AMPHETAMINE-DEXTROAMPHET ER 15 MG PO CP24: 15.0000 mg | ORAL_CAPSULE | ORAL | Status: DC

## 2016-01-27 MED ORDER — ZOLPIDEM TARTRATE 5 MG PO TABS: ORAL_TABLET | ORAL | Status: DC

## 2016-01-28 MED FILL — DEXTROAMP-AMPHET ER 15 MG C: 15 | 30 days supply | Qty: 30 | Fill #0

## 2016-01-28 MED FILL — HYDROCHLOROTHIAZIDE 25 MG T: 25 | 30 days supply | Qty: 30 | Fill #0

## 2016-01-28 MED FILL — ZOLPIDEM TARTRATE 5 MG TAB: 5 | 30 days supply | Qty: 45 | Fill #0

## 2016-02-01 ENCOUNTER — Telehealth: Payer: Self-pay | Admitting: Family Medicine

## 2016-02-01 ENCOUNTER — Ambulatory Visit (INDEPENDENT_AMBULATORY_CARE_PROVIDER_SITE_OTHER): Payer: 59 | Admitting: Physician Assistant

## 2016-02-01 VITALS — BP 130/90 | HR 87 | Temp 98.7°F | Resp 14 | Ht 64.0 in | Wt 165.0 lb

## 2016-02-01 DIAGNOSIS — R358 Other polyuria: Secondary | ICD-10-CM | POA: Diagnosis not present

## 2016-02-01 DIAGNOSIS — R42 Dizziness and giddiness: Secondary | ICD-10-CM | POA: Diagnosis not present

## 2016-02-01 DIAGNOSIS — R5381 Other malaise: Secondary | ICD-10-CM

## 2016-02-01 DIAGNOSIS — R3589 Other polyuria: Secondary | ICD-10-CM

## 2016-02-01 DIAGNOSIS — I10 Essential (primary) hypertension: Secondary | ICD-10-CM

## 2016-02-01 DIAGNOSIS — R631 Polydipsia: Secondary | ICD-10-CM | POA: Diagnosis not present

## 2016-02-01 DIAGNOSIS — J069 Acute upper respiratory infection, unspecified: Secondary | ICD-10-CM

## 2016-02-01 LAB — POCT URINALYSIS DIPSTICK
Bilirubin, UA: NEGATIVE
Blood, UA: NEGATIVE
Glucose, UA: NEGATIVE
Ketones, UA: NEGATIVE
Leukocytes, UA: NEGATIVE
Nitrite, UA: NEGATIVE
Protein, UA: NEGATIVE
Spec Grav, UA: 1.015
Urobilinogen, UA: 0.2
pH, UA: 7.5

## 2016-02-01 LAB — POCT UA - MICROSCOPIC ONLY
Bacteria, U Microscopic: NEGATIVE
Casts, Ur, LPF, POC: NEGATIVE
Crystals, Ur, HPF, POC: NEGATIVE
Mucus, UA: NEGATIVE
RBC, urine, microscopic: NEGATIVE
WBC, Ur, HPF, POC: NEGATIVE
Yeast, UA: NEGATIVE

## 2016-02-01 LAB — POCT CBC
Granulocyte percent: 58.6 %G (ref 37–80)
HCT, POC: 42.4 % (ref 37.7–47.9)
Hemoglobin: 15 g/dL (ref 12.2–16.2)
Lymph, poc: 2.5 (ref 0.6–3.4)
MCH, POC: 31.7 pg — AB (ref 27–31.2)
MCHC: 35.5 g/dL — AB (ref 31.8–35.4)
MCV: 89.3 fL (ref 80–97)
MID (cbc): 0.2 (ref 0–0.9)
MPV: 6.2 fL (ref 0–99.8)
POC Granulocyte: 3.9 (ref 2–6.9)
POC LYMPH PERCENT: 37.7 %L (ref 10–50)
POC MID %: 3.7 %M (ref 0–12)
Platelet Count, POC: 237 10*3/uL (ref 142–424)
RBC: 4.75 M/uL (ref 4.04–5.48)
RDW, POC: 11.9 %
WBC: 6.6 10*3/uL (ref 4.6–10.2)

## 2016-02-01 LAB — POCT GLYCOSYLATED HEMOGLOBIN (HGB A1C): Hemoglobin A1C: 5.5

## 2016-02-01 LAB — GLUCOSE, POCT (MANUAL RESULT ENTRY): POC Glucose: 95 mg/dl (ref 70–99)

## 2016-02-01 MED ORDER — LOSARTAN POTASSIUM 50 MG PO TABS: 50.0000 mg | ORAL_TABLET | Freq: Every day | ORAL | Status: DC

## 2016-02-01 MED ORDER — GUAIFENESIN-CODEINE 200-20 MG/5ML PO LIQD: 5.0000 mL | Freq: Two times a day (BID) | ORAL | Status: DC | PRN

## 2016-02-01 MED FILL — CHERATUSSIN AC SYRUP: 100-10 | 20 days supply | Qty: 200 | Fill #0

## 2016-02-01 MED FILL — LOSARTAN POTASSIUM 50 MG TA: 50 | 30 days supply | Qty: 30 | Fill #0 | Status: TO

## 2016-02-01 NOTE — Telephone Encounter (Signed)
Has URI sxs, cough, last 3 weeks, swabbed for flu and strep were both negative. Dry cough. She has headache feels bad. She has not been hydrating well. She is also on meds that are stimulants or can raise her BP she has been on them for a while now. She has a hx of elevated BP and has been to cardiology , we had considered adding another BP medication but she was resistant to taking anything. Will go ahead and add losartan at this time since doe snot want a ca blocker due to swelling. She will cont to take hctz 12.5 mg BID . Push fluids. I will call in losartan 50 mg daily and also Robitussin with codeien. She will return in 2 week for BMP, if feel worse then need to come in for evaulation. HEr BP was high at work today but she had just walked in to the nurse's station and plopped down and they took her BP.

## 2016-02-01 NOTE — Progress Notes (Signed)
Subjective:    Patient ID: [name] [name], female    DOB: [birth year], 50 y.o.   MRN: [id number]  Chief Complaint  Patient presents with  . Hypertension  . Dizziness  . Nausea   Medications, allergies, past medical history, surgical history, family history, social history and problem list reviewed and updated.  HPI  50 yof presents with above complaints.   First diagnosed with hypertension 6 months ago. Started on lisinopril then changed to hctz. Takes hctz 12.5 bid. Was checking bp regularly for awhile and was normal however stopped checking last month. Checked her bp at work this am and was 178/111. Felt dizzy at time. Spoke with her Dr Marin Comment who added losartan 50 qd. Pt took this for the first time this am. She has been taking her hctz as prescribed. She continues to take adderall and estrogen-methyltestosterone which could be contributing to high bp.   She also mentions intermittent dizziness, nausea, fatigue, and malaise that started fairly suddenly 3 days ago. Assoc head congestion. Got flu vaccine this year. Denies body aches. Denies sore throat. Has been eating normal amount, drinking one cup of coffee which is normal and drinking lots of water (normal). She also mentions polyuria/polydipsia past few days. Denies dysuria. Denies hx DM.   Denies cp or sob. Walked for exercise this past week without issue. Denies fevers. Chills past couple days.   Review of Systems See HPI     Objective:   Physical Exam  Constitutional: She is oriented to person, place, and time. She appears well-developed and well-nourished.  Non-toxic appearance. She does not have a sickly appearance. She does not appear ill. No distress.  BP 164/102 mmHg  Pulse 87  Temp(Src) 98.7 F (37.1 C) (Oral)  Resp 14  Ht 5\' 4"  (1.626 m)  Wt 165 lb (74.844 kg)  BMI 28.31 kg/m2  SpO2 97%   Eyes: Conjunctivae and EOM are normal. Pupils are equal, round, and reactive to light.  Neck: Normal range of motion.    Cardiovascular: Normal rate, regular rhythm and normal heart sounds.   Neurological: She is alert and oriented to person, place, and time. She has normal strength. No cranial nerve deficit or sensory deficit.  Psychiatric: She has a normal mood and affect. Her speech is normal and behavior is normal.   BP recheck 130/90  Results for orders placed or performed in visit on 02/01/16  POCT CBC  Result Value Ref Range   WBC 6.6 4.6 - 10.2 K/uL   Lymph, poc 2.5 0.6 - 3.4   POC LYMPH PERCENT 37.7 10 - 50 %L   MID (cbc) 0.2 0 - 0.9   POC MID % 3.7 0 - 12 %M   POC Granulocyte 3.9 2 - 6.9   Granulocyte percent 58.6 37 - 80 %G   RBC 4.75 4.04 - 5.48 M/uL   Hemoglobin 15.0 12.2 - 16.2 g/dL   HCT, POC 42.4 37.7 - 47.9 %   MCV 89.3 80 - 97 fL   MCH, POC 31.7 (A) 27 - 31.2 pg   MCHC 35.5 (A) 31.8 - 35.4 g/dL   RDW, POC 11.9 %   Platelet Count, POC 237 142 - 424 K/uL   MPV 6.2 0 - 99.8 fL  POCT glucose (manual entry)  Result Value Ref Range   POC Glucose 95 70 - 99 mg/dl  POCT glycosylated hemoglobin (Hb A1C)  Result Value Ref Range   Hemoglobin A1C 5.5   POCT UA - Microscopic Only  Result Value Ref Range   WBC, Ur, HPF, POC neg    RBC, urine, microscopic neg    Bacteria, U Microscopic neg    Mucus, UA neg    Epithelial cells, urine per micros few    Crystals, Ur, HPF, POC neg    Casts, Ur, LPF, POC neg    Yeast, UA neg   POCT urinalysis dipstick  Result Value Ref Range   Color, UA yellow    Clarity, UA clear    Glucose, UA neg    Bilirubin, UA neg    Ketones, UA neg    Spec Grav, UA 1.015    Blood, UA neg    pH, UA 7.5    Protein, UA neg    Urobilinogen, UA 0.2    Nitrite, UA neg    Leukocytes, UA Negative Negative      Assessment & Plan:   Essential hypertension  Malaise - Plan: POCT CBC, POCT glucose (manual entry)  Dizziness and giddiness - Plan: POCT glucose (manual entry)  Polydipsia - Plan: POCT glycosylated hemoglobin (Hb A1C)  Polyuria - Plan: POCT  glycosylated hemoglobin (Hb A1C) --bp down to normal range during visit --continue losartan and hctz, f/u with dr Marin Comment 2 weeks for bmp --unclear etiology of dizziness/weakness -- vitals normal today (other than initially elevated bp), no leukocytosis or anemia, neuro exam benign, no dehydration on ua, pt declining flu swab today, continue to monitor, rtc or ER if persists or worsens --both weakness and dizziness improved by end of visit, pt wondering if they are connected with her elevated bp and resolved once bp came down --normal bg/a1c/ua, no clear etiology polyuria/polydipsia, as above f/u with pcp 2 weeks  Julieta Gutting, PA-C Physician Assistant-Certified [facility]  02/01/2016 2:56 PM

## 2016-02-01 NOTE — Patient Instructions (Signed)
Your blood pressure has come back down which is reassuring.  Continue to take your bp meds as prescribed as be sure to follow with Dr Marin Comment in 2 weeks. Your exam was normal and I am not concerned about a stroke.  Your white blood cell was normal which is reassuring.  Your blood sugar and 3 month average of blood sugars were normal which is reassuring.  You are not anemic. Getting plenty of rest, fluids, and tylenol as needed should help.  If you're not better by early next week or start to feel worse come back to see [place] asap.

## 2016-02-04 DIAGNOSIS — L821 Other seborrheic keratosis: Secondary | ICD-10-CM | POA: Diagnosis not present

## 2016-02-04 DIAGNOSIS — L812 Freckles: Secondary | ICD-10-CM | POA: Diagnosis not present

## 2016-02-04 DIAGNOSIS — L718 Other rosacea: Secondary | ICD-10-CM | POA: Diagnosis not present

## 2016-02-04 DIAGNOSIS — L218 Other seborrheic dermatitis: Secondary | ICD-10-CM | POA: Diagnosis not present

## 2016-02-04 DIAGNOSIS — L918 Other hypertrophic disorders of the skin: Secondary | ICD-10-CM | POA: Diagnosis not present

## 2016-02-05 ENCOUNTER — Ambulatory Visit: Payer: 59 | Admitting: Family Medicine

## 2016-02-11 DIAGNOSIS — H524 Presbyopia: Secondary | ICD-10-CM | POA: Diagnosis not present

## 2016-02-11 DIAGNOSIS — H5203 Hypermetropia, bilateral: Secondary | ICD-10-CM | POA: Diagnosis not present

## 2016-02-20 MED FILL — BUPROPION SR 150 MG TABLET: 150 | 90 days supply | Qty: 180 | Fill #0 | Status: TO

## 2016-02-22 MED FILL — HYDROCHLOROTHIAZIDE 25 MG T: 25 | 30 days supply | Qty: 30 | Fill #1 | Status: TO

## 2016-02-22 MED FILL — EEMT HS 0.625-1.25 MG TAB: 0.625-1.25 | 30 days supply | Qty: 30 | Fill #0

## 2016-02-25 MED FILL — ZOLPIDEM TARTRATE 5 MG TAB: 5 | 30 days supply | Qty: 45 | Fill #1 | Status: TO

## 2016-03-04 ENCOUNTER — Encounter: Payer: Self-pay | Admitting: Family Medicine

## 2016-03-04 MED FILL — DEXTROAMP-AMPHET ER 15 MG C: 15 | 30 days supply | Qty: 30 | Fill #0

## 2016-03-17 ENCOUNTER — Encounter: Payer: Self-pay | Admitting: Physician Assistant

## 2016-03-17 ENCOUNTER — Telehealth: Payer: 59 | Admitting: Family

## 2016-03-17 DIAGNOSIS — L237 Allergic contact dermatitis due to plants, except food: Secondary | ICD-10-CM

## 2016-03-17 MED ORDER — PREDNISONE 10 MG (21) PO TBPK: 10.0000 mg | ORAL_TABLET | ORAL | Status: DC

## 2016-03-17 MED FILL — predniSONE 10 MG TABS: 10 | 6 days supply | Qty: 21 | Fill #0

## 2016-03-17 NOTE — Progress Notes (Signed)

## 2016-03-20 MED FILL — EEMT HS 0.625-1.25 MG TAB: 0.625-1.25 | 30 days supply | Qty: 30 | Fill #1

## 2016-03-24 MED FILL — HYDROCHLOROTHIAZIDE 25 MG T: 25 | 30 days supply | Qty: 30 | Fill #0

## 2016-03-24 MED FILL — LOSARTAN POTASSIUM 50 MG TA: 50 | 30 days supply | Qty: 30 | Fill #0 | Status: TO

## 2016-03-28 MED FILL — ZOLPIDEM TARTRATE 5 MG TAB: 5 | 30 days supply | Qty: 45 | Fill #0

## 2016-04-01 ENCOUNTER — Encounter: Payer: 59 | Admitting: Physician Assistant

## 2016-04-08 ENCOUNTER — Encounter: Payer: 59 | Admitting: Physician Assistant

## 2016-04-08 MED FILL — DEXTROAMP-AMPHET ER 15 MG C: 15 | 30 days supply | Qty: 30 | Fill #0

## 2016-04-22 MED FILL — EEMT HS 0.625-1.25 MG TAB: 0.625-1.25 | 30 days supply | Qty: 30 | Fill #2 | Status: TO

## 2016-04-24 ENCOUNTER — Other Ambulatory Visit: Payer: Self-pay | Admitting: Family Medicine

## 2016-04-25 ENCOUNTER — Other Ambulatory Visit: Payer: Self-pay

## 2016-04-25 ENCOUNTER — Telehealth: Payer: Self-pay

## 2016-04-25 DIAGNOSIS — I10 Essential (primary) hypertension: Secondary | ICD-10-CM

## 2016-04-25 DIAGNOSIS — J069 Acute upper respiratory infection, unspecified: Secondary | ICD-10-CM

## 2016-04-25 MED ORDER — HYDROCHLOROTHIAZIDE 25 MG PO TABS: ORAL_TABLET | ORAL | Status: DC

## 2016-04-25 MED ORDER — LOSARTAN POTASSIUM 50 MG PO TABS: 50.0000 mg | ORAL_TABLET | Freq: Every day | ORAL | Status: DC

## 2016-04-25 MED FILL — LOSARTAN POTASSIUM 50 MG TA: 50 | 90 days supply | Qty: 90 | Fill #0

## 2016-04-25 MED FILL — HYDROCHLOROTHIAZIDE 25 MG T: 25 | 45 days supply | Qty: 90 | Fill #0

## 2016-04-25 NOTE — Telephone Encounter (Signed)
Sent in 90 day RFs of both of her HTN meds and notified pt on VM.

## 2016-04-25 NOTE — Telephone Encounter (Signed)
This was done, duplicate message.

## 2016-04-25 NOTE — Telephone Encounter (Signed)
Patient request a refill on two medications for he hypertension. Patient is not sure the name of the medication. Patient request for the medication to be filled today. [facility] sent over a request also. [phone].

## 2016-04-29 DIAGNOSIS — G4700 Insomnia, unspecified: Secondary | ICD-10-CM | POA: Diagnosis not present

## 2016-04-29 DIAGNOSIS — Z0000 Encounter for general adult medical examination without abnormal findings: Secondary | ICD-10-CM | POA: Diagnosis not present

## 2016-04-29 DIAGNOSIS — M79672 Pain in left foot: Secondary | ICD-10-CM | POA: Diagnosis not present

## 2016-04-29 DIAGNOSIS — Z79890 Hormone replacement therapy (postmenopausal): Secondary | ICD-10-CM | POA: Diagnosis not present

## 2016-04-29 DIAGNOSIS — F909 Attention-deficit hyperactivity disorder, unspecified type: Secondary | ICD-10-CM | POA: Diagnosis not present

## 2016-04-29 DIAGNOSIS — I10 Essential (primary) hypertension: Secondary | ICD-10-CM | POA: Diagnosis not present

## 2016-04-29 MED FILL — ZOLPIDEM TARTRATE 5 MG TAB: 5 | 30 days supply | Qty: 45 | Fill #0

## 2016-05-14 DIAGNOSIS — R944 Abnormal results of kidney function studies: Secondary | ICD-10-CM | POA: Diagnosis not present

## 2016-05-19 MED FILL — DEXTROAMP-AMPHET ER 15 MG C: 15 | 30 days supply | Qty: 30 | Fill #0

## 2016-05-21 MED FILL — ESTROGEN-METHYLTESTOS H.S.: 0.625-1.25 | 30 days supply | Qty: 30 | Fill #0

## 2016-05-21 MED FILL — BUPROPION HCL SR 150 MG TAB: 150 | 90 days supply | Qty: 180 | Fill #0

## 2016-05-29 MED FILL — ZOLPIDEM TARTRATE 5 MG TAB: 5 | 30 days supply | Qty: 45 | Fill #1 | Status: TO

## 2016-06-11 DIAGNOSIS — R350 Frequency of micturition: Secondary | ICD-10-CM | POA: Diagnosis not present

## 2016-06-11 DIAGNOSIS — R1013 Epigastric pain: Secondary | ICD-10-CM | POA: Diagnosis not present

## 2016-06-19 MED FILL — AMPHETAMINE SALTS 15 MG TAB: 15 | 30 days supply | Qty: 30 | Fill #0

## 2016-06-20 MED FILL — ESTROGEN-METHYLTESTOS H.S.: 0.625-1.25 | 30 days supply | Qty: 30 | Fill #0 | Status: TO

## 2016-06-30 MED FILL — ZOLPIDEM TARTRATE 5 MG TAB: 5 | 30 days supply | Qty: 45 | Fill #0

## 2016-06-30 MED FILL — PANTOPRAZOLE SOD DR 40 MG T: 40 | 90 days supply | Qty: 90 | Fill #0

## 2016-07-23 MED FILL — EEMT HS 0.625-1.25 MG TAB: 0.625-1.25 | 30 days supply | Qty: 30 | Fill #0

## 2016-07-24 ENCOUNTER — Other Ambulatory Visit: Payer: Self-pay

## 2016-07-24 MED ORDER — HYDROCHLOROTHIAZIDE 25 MG PO TABS: ORAL_TABLET | ORAL | 0 refills | Status: DC

## 2016-07-24 MED FILL — AMPHETAMINE SALTS 15 MG TAB: 15 | 30 days supply | Qty: 30 | Fill #0

## 2016-07-24 MED FILL — HYDROCHLOROTHIAZIDE 25 MG T: 25 | 28 days supply | Qty: 12 | Fill #0

## 2016-07-24 MED FILL — LOSARTAN POTASSIUM 50 MG TA: 50 | 90 days supply | Qty: 90 | Fill #0

## 2016-07-29 DIAGNOSIS — M25572 Pain in left ankle and joints of left foot: Secondary | ICD-10-CM | POA: Diagnosis not present

## 2016-07-29 DIAGNOSIS — M79672 Pain in left foot: Secondary | ICD-10-CM | POA: Diagnosis not present

## 2016-07-30 MED FILL — ZOLPIDEM TARTRATE 5 MG TAB: 5 | 30 days supply | Qty: 45 | Fill #1

## 2016-08-01 MED FILL — HYDROCHLOROTHIAZIDE 25 MG T: 25 | 30 days supply | Qty: 30 | Fill #0

## 2016-08-05 DIAGNOSIS — N3010 Interstitial cystitis (chronic) without hematuria: Secondary | ICD-10-CM | POA: Diagnosis not present

## 2016-08-08 MED FILL — traZODone HCL 50 MG TABS: 50 | 30 days supply | Qty: 60 | Fill #0 | Status: TO

## 2016-08-08 MED FILL — hydrOXYzine HCL 25 MG TABS: 25 | 30 days supply | Qty: 60 | Fill #0 | Status: TO

## 2016-08-13 DIAGNOSIS — N3010 Interstitial cystitis (chronic) without hematuria: Secondary | ICD-10-CM | POA: Diagnosis not present

## 2016-08-20 MED FILL — BUPROPION SR 150 MG TABLET: 150 | 90 days supply | Qty: 180 | Fill #0

## 2016-08-21 MED FILL — EEMT HS 0.625-1.25 MG TAB: 0.625-1.25 | 30 days supply | Qty: 30 | Fill #0

## 2016-08-28 ENCOUNTER — Other Ambulatory Visit: Payer: Self-pay | Admitting: Physician Assistant

## 2016-08-28 MED FILL — DEXTROAMP-AMPHET ER 15 MG C: 15 | 30 days supply | Qty: 30 | Fill #0

## 2016-09-04 ENCOUNTER — Other Ambulatory Visit: Payer: Self-pay | Admitting: Physician Assistant

## 2016-09-04 MED FILL — HYDROCHLOROTHIAZIDE 25 MG T: 25 | 30 days supply | Qty: 30 | Fill #0 | Status: TO

## 2016-09-22 MED FILL — EEMT HS 0.625-1.25 MG TAB: 0.625-1.25 | 30 days supply | Qty: 30 | Fill #1

## 2016-09-29 MED FILL — DEXTROAMP-AMPHET ER 15 MG C: 15 | 30 days supply | Qty: 30 | Fill #0

## 2016-09-29 MED FILL — HYDROCHLOROTHIAZIDE 25 MG T: 25 | 30 days supply | Qty: 30 | Fill #0

## 2016-09-29 MED FILL — hydrOXYzine HCL 25 MG TABS: 25 | 30 days supply | Qty: 60 | Fill #0

## 2016-10-29 DIAGNOSIS — G4700 Insomnia, unspecified: Secondary | ICD-10-CM | POA: Diagnosis not present

## 2016-10-29 DIAGNOSIS — I10 Essential (primary) hypertension: Secondary | ICD-10-CM | POA: Diagnosis not present

## 2016-10-29 DIAGNOSIS — Z79890 Hormone replacement therapy (postmenopausal): Secondary | ICD-10-CM | POA: Diagnosis not present

## 2016-10-29 DIAGNOSIS — F909 Attention-deficit hyperactivity disorder, unspecified type: Secondary | ICD-10-CM | POA: Diagnosis not present

## 2016-10-29 DIAGNOSIS — F411 Generalized anxiety disorder: Secondary | ICD-10-CM | POA: Diagnosis not present

## 2016-10-29 MED FILL — BUPROPION HCL XL 300 MG TAB: 300 | 30 days supply | Qty: 30 | Fill #0

## 2016-10-29 MED FILL — LOSARTAN POTASSIUM 50 MG TA: 50 | 90 days supply | Qty: 90 | Fill #0

## 2016-10-29 MED FILL — EEMT HS 0.625-1.25 MG TAB: 0.625-1.25 | 30 days supply | Qty: 30 | Fill #0

## 2016-10-29 MED FILL — HYDROCHLOROTHIAZIDE 25 MG T: 25 | 90 days supply | Qty: 90 | Fill #0

## 2016-10-31 MED FILL — DEXTROAMP-AMPHET ER 15 MG C: 15 | 30 days supply | Qty: 30 | Fill #0

## 2016-11-27 MED FILL — hydrOXYzine HCL 25 MG TABS: 25 | 30 days supply | Qty: 60 | Fill #1

## 2016-11-28 MED FILL — EEMT HS 0.625-1.25 MG TAB: 0.625-1.25 | 30 days supply | Qty: 30 | Fill #0 | Status: TO

## 2016-12-03 ENCOUNTER — Other Ambulatory Visit: Payer: Self-pay | Admitting: Physician Assistant

## 2016-12-03 DIAGNOSIS — Z1231 Encounter for screening mammogram for malignant neoplasm of breast: Secondary | ICD-10-CM

## 2016-12-17 MED FILL — BUPROPION HCL XL 300 MG TAB: 300 | 30 days supply | Qty: 30 | Fill #1

## 2016-12-17 MED FILL — ADDERALL XR 15 MG CAP SA: 15 | 30 days supply | Qty: 30 | Fill #0

## 2016-12-26 MED FILL — ESTROGEN-METHYLTESTOS H.S.: 0.625-1.25 | 30 days supply | Qty: 30 | Fill #0

## 2016-12-26 MED FILL — traZODone HCL 50 MG TABS: 50 | 30 days supply | Qty: 60 | Fill #0

## 2017-01-16 MED FILL — HYDROCHLOROTHIAZIDE 25 MG T: 25 | 90 days supply | Qty: 90 | Fill #1

## 2017-01-16 MED FILL — ADDERALL XR 15 MG CAP SA: 15 | 30 days supply | Qty: 30 | Fill #0

## 2017-01-19 ENCOUNTER — Ambulatory Visit (INDEPENDENT_AMBULATORY_CARE_PROVIDER_SITE_OTHER): Payer: 59 | Admitting: Orthopaedic Surgery

## 2017-01-19 ENCOUNTER — Ambulatory Visit (INDEPENDENT_AMBULATORY_CARE_PROVIDER_SITE_OTHER): Payer: Self-pay

## 2017-01-19 ENCOUNTER — Encounter (INDEPENDENT_AMBULATORY_CARE_PROVIDER_SITE_OTHER): Payer: Self-pay | Admitting: Orthopaedic Surgery

## 2017-01-19 DIAGNOSIS — G8929 Other chronic pain: Secondary | ICD-10-CM

## 2017-01-19 DIAGNOSIS — M25562 Pain in left knee: Secondary | ICD-10-CM

## 2017-01-19 DIAGNOSIS — M79672 Pain in left foot: Secondary | ICD-10-CM

## 2017-01-19 MED FILL — BUPROPION HCL XL 300 MG TAB: 300 | 30 days supply | Qty: 30 | Fill #2

## 2017-01-19 NOTE — Progress Notes (Signed)
The patient is here for second opinion. She is established patient of our office since I have done surgery in her mother she wanted to be seen for her left knee and her left foot. She's had a remote history of arthroscopic surgery of her left knee in 2009 and appears that she had a partial medial meniscectomy and removal of the symptomatic medial plica. She has occasional aches and pains in that knee but he gets to be where it sore all the time and is mainly medial joint line. She said sometimes she cannot straighten her knee or been a back fully in the knee does lock into place. She's also has had issues with her left ankle and it almost appears like the ankle locks on occasion she points the anterolateral aspect source or ankle problems. She's had issues with the ankle before but no traumatic injury that she is aware of.  She is pleasant appearing individual. Her review of systems are negative including no chest pain, shortness of breath, fever, chills, nausea, vomiting. She is alert and oriented 3 and walks without a limp. Examination of her left knee shows medial joint line tenderness only. There is a positive Murray sign of the medial side. Her Lockman's is negative. I can flex and extend her knee when she had a past 90 she has considerable medial joint line tenderness and flexion. Examination of her left ankle shows no effusion. She has pain over the anterolateral joint line with good range of motion.  X-rays reviewed of her left knee and her left ankle. The left ankle shows anterior bone spur at the tibiotalar joint which may be causing her symptoms. 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 There is medial joint space narrowing and sclerotic changes. There is mild varus deformity as well.  At this point it do feel an MRI of her left knee is warranted. Having had a partial meniscectomy in 2009 she is developing worsening symptoms of her knee and cartilage problems on the medial joint  line. MRI would be worthwhile to assess for cartilage as well as look for the residual meniscal tear given the locking catching. At some point she may end up needing arthroscopic intervention of her left ankle to address her knee first. She is artery tried and failed all forms conservative treatment. This includes ice, rest, heat, anti-inflammatories and steroid injections.

## 2017-01-23 ENCOUNTER — Telehealth (INDEPENDENT_AMBULATORY_CARE_PROVIDER_SITE_OTHER): Payer: Self-pay | Admitting: *Deleted

## 2017-01-23 NOTE — Telephone Encounter (Signed)
Pt calling asking about MRI that needs to be scheduled. Pt stated she can do any time after 4 next week if that helps you schedule.

## 2017-01-26 ENCOUNTER — Telehealth (INDEPENDENT_AMBULATORY_CARE_PROVIDER_SITE_OTHER): Payer: Self-pay | Admitting: Orthopaedic Surgery

## 2017-01-26 MED FILL — ESTROGEN-METHYLTESTOS H.S.: 0.625-1.25 | 30 days supply | Qty: 30 | Fill #1

## 2017-01-26 MED FILL — hydrOXYzine HCL 25 MG TABS: 25 | 30 days supply | Qty: 60 | Fill #2

## 2017-01-26 MED FILL — diazePAM 5 MG TABS: 5 | 1 days supply | Qty: 2 | Fill #0 | Status: TO

## 2017-01-26 NOTE — Telephone Encounter (Signed)
Patient hasn't received a phone call regarding her MRI. Her knee is really bothering her. CB # [phone]

## 2017-01-26 NOTE — Telephone Encounter (Signed)
Ok, thanks.

## 2017-01-26 NOTE — Telephone Encounter (Signed)
This order was sent to [facility] last week, they will contact pt to schedule appt, I have called [facility] advising pt is needing to be seen ASAP.

## 2017-01-26 NOTE — Telephone Encounter (Signed)
Noted and appt already scheduled

## 2017-01-30 ENCOUNTER — Telehealth (INDEPENDENT_AMBULATORY_CARE_PROVIDER_SITE_OTHER): Payer: Self-pay | Admitting: *Deleted

## 2017-01-30 NOTE — Telephone Encounter (Signed)
Pt called stating she is scheduled to have MRI done soon and they informed her she would have to pay out of pocket $400 dollars and to have with her when she comes in for the procedure, and that she does not have that kind of money right now and wants to cancel the appointment with [facility], but still wants to come in for her appt with Ninfa Linden to discuss further and maybe go more in detail with issue. I advised pt to keep appt and Ninfa Linden will discuss further and maybe have another idea. Pt voiced understanding and will keep appt.

## 2017-02-01 ENCOUNTER — Other Ambulatory Visit: Payer: 59

## 2017-02-02 ENCOUNTER — Ambulatory Visit (INDEPENDENT_AMBULATORY_CARE_PROVIDER_SITE_OTHER): Payer: 59 | Admitting: Orthopaedic Surgery

## 2017-02-02 NOTE — Telephone Encounter (Signed)
FYI

## 2017-02-03 ENCOUNTER — Ambulatory Visit
Admission: RE | Admit: 2017-02-03 | Discharge: 2017-02-03 | Disposition: A | Discharge status: Home / Self Care | Payer: 59 | Source: Ambulatory Visit | Attending: Physician Assistant | Admitting: Physician Assistant

## 2017-02-03 DIAGNOSIS — Z1231 Encounter for screening mammogram for malignant neoplasm of breast: Secondary | ICD-10-CM | POA: Diagnosis not present

## 2017-02-11 ENCOUNTER — Ambulatory Visit (INDEPENDENT_AMBULATORY_CARE_PROVIDER_SITE_OTHER): Payer: 59 | Admitting: Orthopaedic Surgery

## 2017-02-11 DIAGNOSIS — M25562 Pain in left knee: Secondary | ICD-10-CM

## 2017-02-11 DIAGNOSIS — G8929 Other chronic pain: Secondary | ICD-10-CM

## 2017-02-11 NOTE — Progress Notes (Signed)
Patient is a 51 year old that seen recently. I had a shoulder MRI of her left knee to assess the cartilage to rule out a meniscal tear. She had several surgical knee in the past. Her knee has a slight flexion contracture she cannot hyperextend it. She has pain on the medial joint line with locking catching and pain with weightbearing activities. She had tried and failed all forms conservative treatment. We sent her for an MRI but she said that they needed $400 up front. She said she cannot afford to get MRI. On examination of her knee is the same exam she hurts in the medial joint line seems like is a positive Murray sign of the medial side. She cannot hyperextend her knee compared to the other side as well. I still would like to obtain an MRI of this knee to assess the cartilage in rule out a meniscal tear. I did give her handout on hyaluronic acid and that may be something to consider just to help with pain if needed.

## 2017-02-16 MED FILL — ADDERALL XR 15 MG CAP SA: 15 | 30 days supply | Qty: 30 | Fill #0

## 2017-02-16 MED FILL — BUPROPION HCL XL 300 MG TAB: 300 | 30 days supply | Qty: 30 | Fill #3

## 2017-02-24 MED FILL — ESTROGEN-METHYLTESTOS H.S.: 0.625-1.25 | 30 days supply | Qty: 30 | Fill #2 | Status: TO

## 2017-03-04 DIAGNOSIS — H5203 Hypermetropia, bilateral: Secondary | ICD-10-CM | POA: Diagnosis not present

## 2017-03-04 DIAGNOSIS — H52223 Regular astigmatism, bilateral: Secondary | ICD-10-CM | POA: Diagnosis not present

## 2017-03-04 DIAGNOSIS — H524 Presbyopia: Secondary | ICD-10-CM | POA: Diagnosis not present

## 2017-03-16 MED FILL — BUPROPION HCL XL 300 MG TAB: 300 | 30 days supply | Qty: 30 | Fill #4

## 2017-03-19 MED FILL — ADDERALL XR 15 MG CAP SA: 15 | 30 days supply | Qty: 30 | Fill #0

## 2017-03-23 MED FILL — EEMT HS 0.625-1.25 MG TAB: 0.625-1.25 | 30 days supply | Qty: 30 | Fill #0 | Status: TO

## 2017-03-23 MED FILL — LOSARTAN POTASSIUM 50 MG TA: 50 | 90 days supply | Qty: 90 | Fill #1

## 2017-03-30 MED FILL — hydrOXYzine HCL 25 MG TABS: 25 | 30 days supply | Qty: 60 | Fill #3

## 2017-04-14 DIAGNOSIS — R194 Change in bowel habit: Secondary | ICD-10-CM | POA: Diagnosis not present

## 2017-04-14 DIAGNOSIS — R1013 Epigastric pain: Secondary | ICD-10-CM | POA: Diagnosis not present

## 2017-04-14 DIAGNOSIS — R140 Abdominal distension (gaseous): Secondary | ICD-10-CM | POA: Diagnosis not present

## 2017-04-14 DIAGNOSIS — Z800 Family history of malignant neoplasm of digestive organs: Secondary | ICD-10-CM | POA: Diagnosis not present

## 2017-04-14 MED FILL — BUPROPION HCL XL 300 MG TAB: 300 | 30 days supply | Qty: 30 | Fill #5

## 2017-04-15 ENCOUNTER — Other Ambulatory Visit: Payer: Self-pay | Admitting: Gastroenterology

## 2017-04-15 DIAGNOSIS — R1013 Epigastric pain: Secondary | ICD-10-CM

## 2017-04-15 MED FILL — ONDANSETRON HCL 8 MG TABLET: 8 | 30 days supply | Qty: 90 | Fill #0

## 2017-04-17 ENCOUNTER — Ambulatory Visit (HOSPITAL_COMMUNITY)
Admission: RE | Admit: 2017-04-17 | Discharge: 2017-04-17 | Disposition: A | Discharge status: Home / Self Care | Payer: 59 | Source: Ambulatory Visit | Attending: Gastroenterology | Admitting: Gastroenterology

## 2017-04-17 DIAGNOSIS — R1013 Epigastric pain: Secondary | ICD-10-CM | POA: Diagnosis not present

## 2017-04-17 DIAGNOSIS — K769 Liver disease, unspecified: Secondary | ICD-10-CM | POA: Insufficient documentation

## 2017-04-20 MED FILL — ESTROGEN-METHYLTESTOS H.S.: 0.625-1.25 | 30 days supply | Qty: 30 | Fill #0

## 2017-04-20 MED FILL — traZODone HCL 50 MG TABS: 50 | 30 days supply | Qty: 60 | Fill #1

## 2017-04-20 MED FILL — ADDERALL XR 15 MG CAP SA: 15 | 30 days supply | Qty: 30 | Fill #0

## 2017-04-20 MED FILL — HYDROCHLOROTHIAZIDE 25 MG T: 25 | 90 days supply | Qty: 90 | Fill #1

## 2017-04-29 ENCOUNTER — Other Ambulatory Visit (HOSPITAL_COMMUNITY): Payer: 59

## 2017-04-29 ENCOUNTER — Ambulatory Visit (HOSPITAL_COMMUNITY): Payer: 59

## 2017-04-29 ENCOUNTER — Ambulatory Visit (HOSPITAL_COMMUNITY)
Admission: RE | Admit: 2017-04-29 | Discharge: 2017-04-29 | Disposition: A | Discharge status: Home / Self Care | Payer: 59 | Source: Ambulatory Visit | Attending: Gastroenterology | Admitting: Gastroenterology

## 2017-04-29 DIAGNOSIS — R1013 Epigastric pain: Secondary | ICD-10-CM | POA: Insufficient documentation

## 2017-04-29 MED ORDER — TECHNETIUM TC 99M MEBROFENIN IV KIT
5.0000 | PACK | Freq: Once | INTRAVENOUS | Status: AC | PRN
  Administered 2017-04-29: 5 via INTRAVENOUS

## 2017-05-05 DIAGNOSIS — F411 Generalized anxiety disorder: Secondary | ICD-10-CM | POA: Diagnosis not present

## 2017-05-05 DIAGNOSIS — I10 Essential (primary) hypertension: Secondary | ICD-10-CM | POA: Diagnosis not present

## 2017-05-05 DIAGNOSIS — F909 Attention-deficit hyperactivity disorder, unspecified type: Secondary | ICD-10-CM | POA: Diagnosis not present

## 2017-05-05 DIAGNOSIS — Z79890 Hormone replacement therapy (postmenopausal): Secondary | ICD-10-CM | POA: Diagnosis not present

## 2017-05-05 DIAGNOSIS — N183 Chronic kidney disease, stage 3 (moderate): Secondary | ICD-10-CM | POA: Diagnosis not present

## 2017-05-05 DIAGNOSIS — G4700 Insomnia, unspecified: Secondary | ICD-10-CM | POA: Diagnosis not present

## 2017-05-14 ENCOUNTER — Other Ambulatory Visit: Payer: Self-pay | Admitting: Gastroenterology

## 2017-05-14 DIAGNOSIS — R1031 Right lower quadrant pain: Secondary | ICD-10-CM | POA: Diagnosis not present

## 2017-05-14 DIAGNOSIS — R1011 Right upper quadrant pain: Secondary | ICD-10-CM | POA: Diagnosis not present

## 2017-05-14 DIAGNOSIS — K760 Fatty (change of) liver, not elsewhere classified: Secondary | ICD-10-CM | POA: Diagnosis not present

## 2017-05-15 ENCOUNTER — Other Ambulatory Visit: Payer: Self-pay | Admitting: Gastroenterology

## 2017-05-15 DIAGNOSIS — R1031 Right lower quadrant pain: Secondary | ICD-10-CM

## 2017-05-19 MED FILL — BUPROPION HCL XL 300 MG TAB: 300 | 30 days supply | Qty: 30 | Fill #0

## 2017-05-26 MED FILL — hydrOXYzine HCL 25 MG TABS: 25 | 30 days supply | Qty: 60 | Fill #4 | Status: TO

## 2017-05-26 MED FILL — ESTROGEN-METHYLTESTOS H.S.: 0.625-1.25 | 30 days supply | Qty: 30 | Fill #0 | Status: TO

## 2017-05-26 MED FILL — ADDERALL XR 15 MG CAP SA: 15 | 30 days supply | Qty: 30 | Fill #0

## 2017-06-02 ENCOUNTER — Encounter (HOSPITAL_COMMUNITY): Payer: Self-pay

## 2017-06-02 ENCOUNTER — Ambulatory Visit (HOSPITAL_COMMUNITY)
Admission: RE | Admit: 2017-06-02 | Discharge: 2017-06-02 | Disposition: A | Discharge status: Home / Self Care | Payer: 59 | Source: Ambulatory Visit | Attending: Gastroenterology | Admitting: Gastroenterology

## 2017-06-02 DIAGNOSIS — R1031 Right lower quadrant pain: Secondary | ICD-10-CM | POA: Diagnosis not present

## 2017-06-02 MED ORDER — IOPAMIDOL (ISOVUE-300) INJECTION 61%
INTRAVENOUS | Status: AC
Start: 2017-06-02 — End: 2017-06-02
  Administered 2017-06-02: 100 mL
  Filled 2017-06-02: qty 100

## 2017-06-04 DIAGNOSIS — I10 Essential (primary) hypertension: Secondary | ICD-10-CM | POA: Diagnosis not present

## 2017-06-18 MED FILL — BUPROPION HCL XL 300 MG TAB: 300 | 30 days supply | Qty: 30 | Fill #1

## 2017-06-23 MED FILL — LOSARTAN POTASSIUM 50 MG TA: 50 | 90 days supply | Qty: 90 | Fill #0

## 2017-06-24 MED FILL — EEMT HS 0.625-1.25 MG TAB: 0.625-1.25 | 30 days supply | Qty: 30 | Fill #0

## 2017-06-26 MED FILL — ADDERALL XR 15 MG CAP SA: 15 | 30 days supply | Qty: 30 | Fill #0

## 2017-07-09 DIAGNOSIS — L237 Allergic contact dermatitis due to plants, except food: Secondary | ICD-10-CM | POA: Diagnosis not present

## 2017-07-17 MED FILL — buPROPion HCL ER (XL) 300 M: 300 | 30 days supply | Qty: 30 | Fill #2

## 2017-07-22 MED FILL — HYDROCHLOROTHIAZIDE 25 MG T: 25 | 30 days supply | Qty: 30 | Fill #2 | Status: TO

## 2017-07-27 MED FILL — EEMT HS 0.625-1.25 MG TAB: 0.625-1.25 | 30 days supply | Qty: 30 | Fill #1

## 2017-07-27 MED FILL — hydrOXYzine HCL 25 MG TABS: 25 | 30 days supply | Qty: 60 | Fill #0

## 2017-08-11 MED FILL — buPROPion HCL ER (XL) 300 M: 300 | 30 days supply | Qty: 30 | Fill #3 | Status: TO

## 2017-08-11 MED FILL — ADDERALL XR 15 MG CAP SA: 15 | 30 days supply | Qty: 30 | Fill #0

## 2017-08-12 MED FILL — traZODone HCL 50 MG TABS: 50 | 30 days supply | Qty: 15 | Fill #0 | Status: TO

## 2017-08-24 MED FILL — EEMT HS 0.625-1.25 MG TAB: 0.625-1.25 | 30 days supply | Qty: 30 | Fill #2

## 2017-08-25 MED FILL — HYDROCHLOROTHIAZIDE 25 MG T: 25 | 30 days supply | Qty: 30 | Fill #0

## 2017-09-14 MED FILL — traZODone HCL 50 MG TABS: 50 | 30 days supply | Qty: 15 | Fill #0

## 2017-09-14 MED FILL — BUPROPION HCL XL 300 MG TAB: 300 | 30 days supply | Qty: 30 | Fill #0

## 2017-09-15 MED FILL — ADDERALL XR 15 MG CAP SA: 15 | 30 days supply | Qty: 30 | Fill #0

## 2017-09-18 MED FILL — LOSARTAN POTASSIUM 50 MG TA: 50 | 60 days supply | Qty: 60 | Fill #1

## 2017-09-25 MED FILL — EEMT HS 0.625-1.25 MG TAB: 0.625-1.25 | 30 days supply | Qty: 30 | Fill #3

## 2017-09-25 MED FILL — HYDROCHLOROTHIAZIDE 25 MG T: 25 | 30 days supply | Qty: 30 | Fill #1

## 2017-10-13 MED FILL — traZODone HCL 50 MG TABS: 50 | 30 days supply | Qty: 15 | Fill #1

## 2017-10-13 MED FILL — BUPROPION HCL XL 300 MG TAB: 300 | 30 days supply | Qty: 30 | Fill #1

## 2017-10-15 MED FILL — ADDERALL XR 15 MG CAP SA: 15 | 30 days supply | Qty: 30 | Fill #0

## 2017-10-22 MED FILL — HYDROCHLOROTHIAZIDE 25 MG T: 25 | 30 days supply | Qty: 30 | Fill #2

## 2017-10-23 MED FILL — ESTROGEN-METHYLTESTOS H.S.: 0.625-1.25 | 30 days supply | Qty: 30 | Fill #4

## 2017-11-09 MED FILL — traZODone HCL 50 MG TABS: 50 | 30 days supply | Qty: 15 | Fill #0

## 2017-11-11 MED FILL — BUPROPION HCL XL 300 MG TAB: 300 | 30 days supply | Qty: 30 | Fill #0

## 2017-11-17 DIAGNOSIS — F411 Generalized anxiety disorder: Secondary | ICD-10-CM | POA: Diagnosis not present

## 2017-11-17 DIAGNOSIS — I10 Essential (primary) hypertension: Secondary | ICD-10-CM | POA: Diagnosis not present

## 2017-11-17 DIAGNOSIS — N183 Chronic kidney disease, stage 3 (moderate): Secondary | ICD-10-CM | POA: Diagnosis not present

## 2017-11-17 DIAGNOSIS — G4700 Insomnia, unspecified: Secondary | ICD-10-CM | POA: Diagnosis not present

## 2017-11-17 DIAGNOSIS — H6982 Other specified disorders of Eustachian tube, left ear: Secondary | ICD-10-CM | POA: Diagnosis not present

## 2017-11-17 DIAGNOSIS — Z79890 Hormone replacement therapy (postmenopausal): Secondary | ICD-10-CM | POA: Diagnosis not present

## 2017-11-17 DIAGNOSIS — F909 Attention-deficit hyperactivity disorder, unspecified type: Secondary | ICD-10-CM | POA: Diagnosis not present

## 2017-11-17 MED FILL — EEMT HS 0.625-1.25 MG TAB: 0.625-1.25 | 30 days supply | Qty: 30 | Fill #0

## 2017-11-17 MED FILL — HYDROCHLOROTHIAZIDE 25 MG T: 25 | 30 days supply | Qty: 30 | Fill #0 | Status: TO

## 2017-11-17 MED FILL — LOSARTAN POTASSIUM 50 MG TA: 50 | 30 days supply | Qty: 30 | Fill #0

## 2017-11-17 MED FILL — ADDERALL XR 15 MG CAP SA: 15 | 30 days supply | Qty: 30 | Fill #0

## 2017-12-07 MED FILL — traZODone HCL 50 MG TABS: 50 | 30 days supply | Qty: 15 | Fill #0 | Status: TO

## 2017-12-07 MED FILL — BUPROPION HCL XL 300 MG TAB: 300 | 30 days supply | Qty: 30 | Fill #0 | Status: TO

## 2017-12-17 MED FILL — LOSARTAN POTASSIUM 50 MG TA: 50 | 30 days supply | Qty: 30 | Fill #1

## 2017-12-21 MED FILL — EEMT HS 0.625-1.25 MG TAB: 0.625-1.25 | 30 days supply | Qty: 30 | Fill #1

## 2017-12-21 MED FILL — ADDERALL XR 15 MG CAP SA: 15 | 30 days supply | Qty: 30 | Fill #0

## 2017-12-29 MED FILL — HYDROCHLOROTHIAZIDE 25 MG T: 25 | 30 days supply | Qty: 30 | Fill #0

## 2018-01-01 ENCOUNTER — Other Ambulatory Visit: Payer: Self-pay | Admitting: Physician Assistant

## 2018-01-01 DIAGNOSIS — Z139 Encounter for screening, unspecified: Secondary | ICD-10-CM

## 2018-01-05 MED FILL — traZODone HCL 50 MG TABS: 50 | 30 days supply | Qty: 15 | Fill #0 | Status: TO

## 2018-01-11 MED FILL — LOSARTAN POTASSIUM 50 MG TA: 50 | 30 days supply | Qty: 30 | Fill #2

## 2018-01-11 MED FILL — BUPROPION HCL XL 300 MG TAB: 300 | 30 days supply | Qty: 30 | Fill #0

## 2018-01-15 ENCOUNTER — Other Ambulatory Visit: Payer: 59 | Admitting: Internal Medicine

## 2018-01-19 ENCOUNTER — Encounter: Payer: 59 | Admitting: Internal Medicine

## 2018-01-20 MED FILL — ADDERALL XR 15 MG CAP SA: 15 | 30 days supply | Qty: 30 | Fill #0

## 2018-01-20 MED FILL — EEMT HS 0.625-1.25 MG TAB: 0.625-1.25 | 30 days supply | Qty: 30 | Fill #2 | Status: TO

## 2018-02-02 MED FILL — traZODone HCL 50 MG TABS: 50 | 30 days supply | Qty: 15 | Fill #0 | Status: TO

## 2018-02-02 MED FILL — HYDROCHLOROTHIAZIDE 25 MG T: 25 | 30 days supply | Qty: 30 | Fill #1 | Status: TO

## 2018-02-10 MED FILL — BUPROPION HCL XL 300 MG TAB: 300 | 30 days supply | Qty: 30 | Fill #1 | Status: TO

## 2018-02-10 MED FILL — LOSARTAN POTASSIUM 50 MG TA: 50 | 30 days supply | Qty: 30 | Fill #3

## 2018-02-17 ENCOUNTER — Telehealth (INDEPENDENT_AMBULATORY_CARE_PROVIDER_SITE_OTHER): Payer: Self-pay | Admitting: Orthopaedic Surgery

## 2018-02-17 ENCOUNTER — Other Ambulatory Visit (INDEPENDENT_AMBULATORY_CARE_PROVIDER_SITE_OTHER): Payer: Self-pay

## 2018-02-17 DIAGNOSIS — G8929 Other chronic pain: Secondary | ICD-10-CM

## 2018-02-17 DIAGNOSIS — M25562 Pain in left knee: Principal | ICD-10-CM

## 2018-02-17 NOTE — Telephone Encounter (Signed)
Sent new order for MRI

## 2018-02-17 NOTE — Telephone Encounter (Signed)
Patient called advised she is ready to get scheduled for the MRI. Patient advised she now has the [facility] thru [facility].The number to contact patient is [phone] or [phone]

## 2018-02-18 MED FILL — ADDERALL XR 15 MG CAP SA: 15 | 30 days supply | Qty: 30 | Fill #0

## 2018-02-19 MED FILL — ESTROGEN-METHYLTESTOS H.S.: 0.625-1.25 | 30 days supply | Qty: 30 | Fill #0

## 2018-02-23 ENCOUNTER — Telehealth (INDEPENDENT_AMBULATORY_CARE_PROVIDER_SITE_OTHER): Payer: Self-pay | Admitting: *Deleted

## 2018-02-23 ENCOUNTER — Ambulatory Visit
Admission: RE | Admit: 2018-02-23 | Discharge: 2018-02-23 | Disposition: A | Discharge status: Home / Self Care | Payer: No Typology Code available for payment source | Source: Ambulatory Visit | Attending: Physician Assistant | Admitting: Physician Assistant

## 2018-02-23 DIAGNOSIS — Z139 Encounter for screening, unspecified: Secondary | ICD-10-CM

## 2018-02-23 NOTE — Telephone Encounter (Signed)
Pt called wanting to know if she could get MRI of her other knee also, states she mentioned both when in office. Please advise.

## 2018-02-23 NOTE — Telephone Encounter (Signed)
Please advise 

## 2018-02-23 NOTE — Telephone Encounter (Signed)
Since we have not seen her in now over a year, we unfortunately will need to see her again before ordering an MRI I think.

## 2018-02-24 NOTE — Telephone Encounter (Signed)
Pt aware will discuss other knee at office visit

## 2018-02-24 NOTE — Telephone Encounter (Signed)
He said just the one knee right now, we will discuss ordering the other when we see her in office

## 2018-03-01 ENCOUNTER — Other Ambulatory Visit (INDEPENDENT_AMBULATORY_CARE_PROVIDER_SITE_OTHER): Payer: Self-pay | Admitting: Orthopaedic Surgery

## 2018-03-01 MED FILL — traZODone HCL 50 MG TABS: 50 | 30 days supply | Qty: 15 | Fill #0

## 2018-03-01 NOTE — Telephone Encounter (Signed)
Called to pharmacy 

## 2018-03-01 NOTE — Telephone Encounter (Signed)
Is she having an MRI?

## 2018-03-01 NOTE — Telephone Encounter (Signed)
Ok to refill? Scheduled for an MRI on 03/08/18.

## 2018-03-03 ENCOUNTER — Other Ambulatory Visit (INDEPENDENT_AMBULATORY_CARE_PROVIDER_SITE_OTHER): Payer: Self-pay | Admitting: Orthopaedic Surgery

## 2018-03-03 MED FILL — diazePAM 5 MG TABS: 5 | 1 days supply | Qty: 2 | Fill #0

## 2018-03-04 MED FILL — HYDROCHLOROTHIAZIDE 25 MG T: 25 | 90 days supply | Qty: 90 | Fill #0

## 2018-03-08 ENCOUNTER — Ambulatory Visit (HOSPITAL_COMMUNITY)
Admission: RE | Admit: 2018-03-08 | Discharge: 2018-03-08 | Disposition: A | Discharge status: Home / Self Care | Payer: No Typology Code available for payment source | Source: Ambulatory Visit | Attending: Orthopaedic Surgery | Admitting: Orthopaedic Surgery

## 2018-03-08 DIAGNOSIS — M1712 Unilateral primary osteoarthritis, left knee: Secondary | ICD-10-CM | POA: Insufficient documentation

## 2018-03-08 DIAGNOSIS — M25562 Pain in left knee: Secondary | ICD-10-CM | POA: Insufficient documentation

## 2018-03-08 DIAGNOSIS — G8929 Other chronic pain: Secondary | ICD-10-CM | POA: Insufficient documentation

## 2018-03-10 MED FILL — BUPROPION HCL XL 300 MG TAB: 300 | 30 days supply | Qty: 30 | Fill #0

## 2018-03-15 ENCOUNTER — Ambulatory Visit (INDEPENDENT_AMBULATORY_CARE_PROVIDER_SITE_OTHER): Payer: No Typology Code available for payment source | Admitting: Orthopaedic Surgery

## 2018-03-15 ENCOUNTER — Encounter (INDEPENDENT_AMBULATORY_CARE_PROVIDER_SITE_OTHER): Payer: Self-pay | Admitting: Orthopaedic Surgery

## 2018-03-15 DIAGNOSIS — G8929 Other chronic pain: Secondary | ICD-10-CM | POA: Diagnosis not present

## 2018-03-15 DIAGNOSIS — M1712 Unilateral primary osteoarthritis, left knee: Secondary | ICD-10-CM | POA: Diagnosis not present

## 2018-03-15 DIAGNOSIS — M25561 Pain in right knee: Secondary | ICD-10-CM | POA: Diagnosis not present

## 2018-03-15 DIAGNOSIS — M25562 Pain in left knee: Secondary | ICD-10-CM

## 2018-03-15 DIAGNOSIS — M1711 Unilateral primary osteoarthritis, right knee: Secondary | ICD-10-CM

## 2018-03-15 MED FILL — LOSARTAN POTASSIUM 50 MG TA: 50 | 30 days supply | Qty: 30 | Fill #4

## 2018-03-15 NOTE — Progress Notes (Signed)
Patient is here to go over an MRI of her left knee.  She has chronic pain in both her knees.  She is had multiple arthroscopic interventions on both knees.  It is hard  Fully extend her left knee and it is painful mainly on the medial aspect of both knees.  On exam there is no significant effusion but there is medial joint line tenderness on her left knee and patellofemoral crepitation.  There is the same on the right knee.  The MRI of the left knee does show areas of full-thickness cartilage loss in the medial compartment of her knee.  There is some mucoid degeneration ACL.  There is no new meniscal tear but she can see she has had a previous partial medial meniscectomy on the left knee.  At this point she is a perfect candidate to try hyaluronic acid on.  She has not had these type injections I gave her handouts on it.  She is already tried conservative treatment with activity modification and anti-inflammatories as well as steroid injections.  This would be the next step for her.  All questions and concerns were answered and addressed.  We will see her back in 2 weeks to place hyaluronic acid in both her knees.

## 2018-03-16 ENCOUNTER — Telehealth (INDEPENDENT_AMBULATORY_CARE_PROVIDER_SITE_OTHER): Payer: Self-pay

## 2018-03-16 NOTE — Telephone Encounter (Signed)
Faxed application to Patient Enrollment at [phone] for Monovisc, bilateral knee.

## 2018-03-17 ENCOUNTER — Telehealth (INDEPENDENT_AMBULATORY_CARE_PROVIDER_SITE_OTHER): Payer: Self-pay

## 2018-03-17 NOTE — Telephone Encounter (Signed)
Received fax from [employer] program stating that they had received application for Monovisc injection.

## 2018-03-18 MED FILL — ESTROGEN-METHYLTESTOS H.S.: 0.625-1.25 | 30 days supply | Qty: 30 | Fill #1 | Status: TO

## 2018-03-23 MED FILL — ADDERALL XR 15 MG CAP SA: 15 | 30 days supply | Qty: 30 | Fill #0

## 2018-03-30 ENCOUNTER — Ambulatory Visit (INDEPENDENT_AMBULATORY_CARE_PROVIDER_SITE_OTHER): Payer: No Typology Code available for payment source | Admitting: Orthopaedic Surgery

## 2018-03-31 MED FILL — traZODone HCL 50 MG TABS: 50 | 30 days supply | Qty: 15 | Fill #1

## 2018-04-05 ENCOUNTER — Encounter (INDEPENDENT_AMBULATORY_CARE_PROVIDER_SITE_OTHER): Payer: Self-pay

## 2018-04-05 ENCOUNTER — Ambulatory Visit (INDEPENDENT_AMBULATORY_CARE_PROVIDER_SITE_OTHER): Payer: No Typology Code available for payment source | Admitting: Orthopaedic Surgery

## 2018-04-05 DIAGNOSIS — M1712 Unilateral primary osteoarthritis, left knee: Secondary | ICD-10-CM

## 2018-04-05 DIAGNOSIS — M1711 Unilateral primary osteoarthritis, right knee: Secondary | ICD-10-CM

## 2018-04-05 NOTE — Progress Notes (Signed)
Patient came in today for Monovisc injection.  VOB and PA not done yet, so we had to cancel today's appt.  I will reschedule the appointment once I get the PA on this.

## 2018-04-06 ENCOUNTER — Telehealth (INDEPENDENT_AMBULATORY_CARE_PROVIDER_SITE_OTHER): Payer: Self-pay

## 2018-04-06 NOTE — Telephone Encounter (Signed)
Abigail Butts M.started PA for patient on 04/05/18 and faxed clinical notes to [phone].  Abigail Butts M.talked with Leda Quail with [facility] concerning PA for [phone] on 04/05/18.

## 2018-04-08 ENCOUNTER — Telehealth (INDEPENDENT_AMBULATORY_CARE_PROVIDER_SITE_OTHER): Payer: Self-pay

## 2018-04-08 DIAGNOSIS — G8929 Other chronic pain: Secondary | ICD-10-CM

## 2018-04-08 DIAGNOSIS — M25561 Pain in right knee: Principal | ICD-10-CM

## 2018-04-08 NOTE — Telephone Encounter (Signed)
Talked with patient and advised her that a decision has not been made to approve her for Monovisc injection, bilateral knee.  Advised patient that I have been calling the nurse, Alyse Low with [employer] that's in charge of her case since 12:45pm and her VM is full and I can not leave a message and I will continue to call her unitl I have reached her.    Patient would like a call back from you and Dr. Ninfa Linden.  Cb# is [phone].  Thank You.

## 2018-04-08 NOTE — Telephone Encounter (Signed)
Patient called to check on injection authorization.   Can call before 3:30 at her work # [phone]  Or after 3:30 on her mobile # [phone]

## 2018-04-08 NOTE — Telephone Encounter (Signed)
Talked with Legrand Como a representative with patient's insurance [employer] and was advised that no decision has been for Monovisc injection, bilateral knee.  Stated that clinic notes had been received and I was transferred to Christy's extension, who is the nurse thats response for the patient's case.  Tried to leave a VM, but mailbox was full.  Will call again later.  CB# is [phone].

## 2018-04-08 NOTE — Telephone Encounter (Signed)
Talked with Legrand Como a representative with patient's insurance [employer] and was advised that no decision has been for Monovisc injection, bilateral knee.  Stated that clinic notes had been received and I was transferred to Christy's extension, who is the nurse thats response for the patient's case.  Tried to leave a VM, but mailbox was full.  Will call again later.  CB# is [phone].

## 2018-04-08 NOTE — Telephone Encounter (Signed)
IC [employer] and s/w Ronalee Belts and he says that the PA is still pending.  Ronalee Belts tried [employer] as well, and could not reach her.  He will send her a message in their system to call either April or myself.  IC patient and advised all this.  I also discussed with her that we should not have brought her back in for an appt until we had the PA done.  We let it fall under the radar and did not catch that she had an appt, and still needed PA.  We also discussed the $30 copay she paid, she does want this refunded.  She says the balance on her account is being paid to [facility] [employer], not [facility], and that that balance should not show up as owed through [employer].  I will discuss with Anne Ng and call patient back to advise.  Patient is off next Tuesday 04/13/18, and can come anytime that day should we get the PA by then.   Anne Ng, please see me tomorrow about this one. Dr Ninfa Linden, Juluis Rainier to you only, no action needed.

## 2018-04-09 ENCOUNTER — Telehealth (INDEPENDENT_AMBULATORY_CARE_PROVIDER_SITE_OTHER): Payer: Self-pay

## 2018-04-09 MED FILL — BuPROPion HCL ER (XL) 300 M: 300 | 30 days supply | Qty: 30 | Fill #1 | Status: TO

## 2018-04-09 NOTE — Telephone Encounter (Signed)
Tried [employer] with [employer],nurse that is responsbile for patient's case, but still no answer and VM is still full.  Will try again later.

## 2018-04-12 ENCOUNTER — Telehealth (INDEPENDENT_AMBULATORY_CARE_PROVIDER_SITE_OTHER): Payer: Self-pay

## 2018-04-12 NOTE — Telephone Encounter (Signed)
Tried [employer] with [employer],nurse that is responsbile for patient's case, but still no answer and VM is still full.  Will try again later.

## 2018-04-13 NOTE — Telephone Encounter (Signed)
Noted. Thank You.

## 2018-04-13 NOTE — Telephone Encounter (Signed)
IC [employer] again, s/w Ronalee Belts again, he says it appears that our PA will deny.  There is no denial letter in the system yet though.  He forwarded me to Mia Creek, RN who has the case.  Again, her voice mail is full and I cannot leave a message.  If we want to appeal the fax is [phone].  I called patient and LM for her to call me so I can update her.   FYI

## 2018-04-14 ENCOUNTER — Telehealth (INDEPENDENT_AMBULATORY_CARE_PROVIDER_SITE_OTHER): Payer: Self-pay

## 2018-04-14 MED FILL — LOSARTAN POTASSIUM 50 MG TA: 50 | 30 days supply | Qty: 30 | Fill #5

## 2018-04-14 NOTE — Telephone Encounter (Signed)
At this point since they are denying hyaluronic acid and given the fact that she has areas of full-thickness cartilage loss in her knee, the other treatment options would be a knee replacement.  Arthroscopic surgery with a knee scope is not warranted because this would not help with the arthritis.  Given the areas of full-thickness cartilage loss the other treatment other than an occasional steroid injection as well as strengthening her knees and activity modification would be a knee replacement for her left knee.

## 2018-04-14 NOTE — Telephone Encounter (Signed)
Patient's insurance does not consider any of the viscosupplentation medically necessary.  Patient is asking what you would recommend at this point?

## 2018-04-14 NOTE — Telephone Encounter (Signed)
FYI-  Amy who is over [place] with [employer] called wanting to know if we had received a fax for patient's denial of injection.  Advised that we did and she stated that they do not approve viscosupplementation injections.  CB# is [phone].

## 2018-04-14 NOTE — Telephone Encounter (Signed)
IC patient and LMVM for her to call me.  Per letter from her insurance, they deem any knee viscosupplementation not medically necessary. Will discuss with her when she calls.

## 2018-04-15 NOTE — Telephone Encounter (Signed)
IC patient and advised.  Patient says that now the right knee is actually hurting more than the left. She is wanting to get the right knee MRI done, to have both knees evaluated prior to her making a big decision for total knee.  Please advise.

## 2018-04-15 NOTE — Telephone Encounter (Signed)
I am fine then with her having an MRI of her right knee to assess the cartilage and rule out a meniscal tear given the failure of conservative treatment including rest, ice, heat, anti-inflammatories, and a steroid injection.

## 2018-04-19 MED FILL — EEMT HS 0.625-1.25 MG TAB: 0.625-1.25 | 30 days supply | Qty: 30 | Fill #0

## 2018-04-20 ENCOUNTER — Other Ambulatory Visit (INDEPENDENT_AMBULATORY_CARE_PROVIDER_SITE_OTHER): Payer: Self-pay | Admitting: Radiology

## 2018-04-20 MED ORDER — DIAZEPAM 5 MG PO TABS: ORAL_TABLET | ORAL | 0 refills | Status: DC

## 2018-04-20 MED FILL — ADDERALL XR 15 MG CAP SA: 15 | 30 days supply | Qty: 30 | Fill #0

## 2018-04-20 NOTE — Addendum Note (Signed)
Addended by: Brand Males E on: 04/20/2018 09:38 AM   Modules accepted: Orders

## 2018-04-20 NOTE — Telephone Encounter (Signed)
IC patient and advised. MRI order entered.  She will call to sched an appt to review scan once the scan is scheduled.

## 2018-04-21 MED FILL — diazePAM 5 MG TABS: 5 | 1 days supply | Qty: 3 | Fill #0

## 2018-04-26 ENCOUNTER — Ambulatory Visit (HOSPITAL_COMMUNITY): Payer: No Typology Code available for payment source

## 2018-04-27 ENCOUNTER — Ambulatory Visit (HOSPITAL_COMMUNITY)
Admission: RE | Admit: 2018-04-27 | Discharge: 2018-04-27 | Disposition: A | Discharge status: Home / Self Care | Payer: No Typology Code available for payment source | Source: Ambulatory Visit | Attending: Orthopaedic Surgery | Admitting: Orthopaedic Surgery

## 2018-04-27 DIAGNOSIS — M25561 Pain in right knee: Secondary | ICD-10-CM | POA: Diagnosis present

## 2018-04-27 DIAGNOSIS — G8929 Other chronic pain: Secondary | ICD-10-CM | POA: Diagnosis present

## 2018-04-28 MED FILL — AMOXICILLIN 500 MG CAPSULE: 500 | 8 days supply | Qty: 24 | Fill #0

## 2018-04-29 MED FILL — traZODone HCL 50 MG TABS: 50 | 30 days supply | Qty: 15 | Fill #2

## 2018-05-03 ENCOUNTER — Encounter (INDEPENDENT_AMBULATORY_CARE_PROVIDER_SITE_OTHER): Payer: Self-pay | Admitting: Orthopaedic Surgery

## 2018-05-03 ENCOUNTER — Ambulatory Visit (INDEPENDENT_AMBULATORY_CARE_PROVIDER_SITE_OTHER): Payer: No Typology Code available for payment source | Admitting: Orthopaedic Surgery

## 2018-05-03 DIAGNOSIS — G8929 Other chronic pain: Secondary | ICD-10-CM

## 2018-05-03 DIAGNOSIS — M25561 Pain in right knee: Secondary | ICD-10-CM

## 2018-05-03 NOTE — Progress Notes (Signed)
The patient returns for follow-up after having an MRI of her right knee.  She has a previous remote knee arthroscopy of her right knee by Dr. Durward Fortes years ago.  She was continued to have sensation of a catching sensation in her knee especially when she flexes her back.  She does get significant pain she is been sitting for 10 minutes gets up to walk she has a lot of pain in her knee.  Her plain film findings in the past showed only minimal arthritic changes.  I did send her for repeat MRI of her knee.  On exam she does have medial joint tenderness and seems to respond to me rotating the tibia on the femur on the medial side with pain.  She has pain past 90 degrees of flexion as well.  The MRI is reviewed with her and it does show blunting of her meniscus to suggest previous meniscal surgery but I do not see frank gross tear.  There is mucoid degeneration of the ACL I can see cystic changes at the tibia side of the ACL but it appears to be intact.  The radiologist says there is only partial-thickness cartilage loss on the medial compartment of her knee.  She may end up benefiting from a repeat knee arthroscopy given her continued mechanical symptoms but I would like to send her to my partner Dr. Alphonzo Severance for second opinion to see if he feels that she would benefit from an arthroscopic intervention as well.  She agrees to having this opinion as well.

## 2018-05-04 ENCOUNTER — Ambulatory Visit (INDEPENDENT_AMBULATORY_CARE_PROVIDER_SITE_OTHER): Payer: No Typology Code available for payment source | Admitting: Orthopedic Surgery

## 2018-05-04 DIAGNOSIS — G8929 Other chronic pain: Secondary | ICD-10-CM | POA: Diagnosis not present

## 2018-05-04 DIAGNOSIS — M25561 Pain in right knee: Secondary | ICD-10-CM

## 2018-05-08 ENCOUNTER — Encounter (INDEPENDENT_AMBULATORY_CARE_PROVIDER_SITE_OTHER): Payer: Self-pay | Admitting: Orthopedic Surgery

## 2018-05-08 NOTE — Progress Notes (Signed)
Office Visit Note   Patient: [name] [name]           Date of Birth: 06-07-1966           MRN: [id number] Visit Date: 05/04/2018 Requested by: Maude Leriche, PA-C [place], [place] [id number] PCP: Scifres, Durel Salts  Subjective: Chief Complaint  Patient presents with  . Right Knee - Pain    HPI: [name] [name] is a patient with right knee pain.  Sent here for second opinion.  She had an MRI of the right knee on 04/27/2018.  Has a history of knee arthroscopy with Dr. Durward Fortes many years ago.  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  She states that her knee "gets stuck".  Hard for her to stand up after sitting.  She states that the right knee feels tight.  Describes global pain in the knee.  Does not take much in the way medication the problem.  She has not had a recent injection into the knee.  She is not too keen on injections.  She does yoga.  She likes to walk for exercise and uses a knee brace.  It is hard for her to stand for long period of time.  She does have a history of hypertension but no family or personal history of DVT or pulmonary embolism.              ROS: All systems reviewed are negative as they relate to the chief complaint within the history of present illness.  Patient denies  fevers or chills.   Assessment & Plan: Visit Diagnoses:  1. Chronic pain of right knee     Plan: Impression is right knee pain and mechanical symptoms.  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  She also localizes a lot of symptoms in the retropatellar region.  I think it is a 50-50 proposition that she improves from arthroscopy and debridement of that superior flap of the medial meniscal horizontal cleavage tear and debridement of the retropatellar region scar tissue from prior arthroscopy.  She describes definite mechanical catching in the  knee and has really failed a course of observation and nonoperative management.  My sense is that Prince would like to undergo arthroscopic evaluation even though the odds are not greatly in her favor that she will have a significant benefit.  The knee definitely does not appear to have enough degenerative change to warrant any type of knee replacement so I think arthroscopy is a reasonable option at this time.  This is especially true considering her reluctance to undergo injection treatment.  I will send her back to Dr. Ninfa Linden for consideration and recommendation for arthroscopic evaluation of the right knee.  Follow-Up Instructions: Return if symptoms worsen or fail to improve.   Orders:  No orders of the defined types were placed in this encounter.  No orders of the defined types were placed in this encounter.     Procedures: No procedures performed   Clinical Data: No additional findings.  Objective: Vital Signs: There were no vitals taken for this visit.  Physical Exam:   Constitutional: Patient appears well-developed HEENT:  Head: Normocephalic Eyes:EOM are normal Neck: Normal range of motion Cardiovascular: Normal rate Pulmonary/chest: Effort normal Neurologic: Patient is alert Skin: Skin is warm Psychiatric: Patient has normal mood and  affect    Ortho Exam: Orthopedic exam demonstrates no effusion of the right knee.  She has mild medial and lateral joint line tenderness with full range of motion.  Intact.  Mild patellofemoral crepitus is present bilaterally.  No nerve root tension signs and no groin pain on the right-hand side.  Pedal pulses palpable.  Tone is good bilaterally in the legs.  Specialty Comments:  No specialty comments available.  Imaging: No results found.   PMFS History: Patient Active Problem List   Diagnosis Date Noted  . Chronic pain of right knee 03/15/2018  . Chronic pain of left knee 03/15/2018  . Unilateral primary osteoarthritis,  left knee 03/15/2018  . Unilateral primary osteoarthritis, right knee 03/15/2018  . H/O shoulder surgery   . Constipation 11/28/2015  . Family history of colon cancer 11/28/2015  . Rectal bleeding 11/28/2015  . Low back pain 11/28/2015  . Dysthymia 08/04/2012  . ADD (attention deficit disorder) 08/04/2012  . Sleep disturbance 08/04/2012  . IC (interstitial cystitis) 08/04/2012  . H/O hysterectomy for benign disease 04/07/2012   Past Medical History:  Diagnosis Date  . ADD (attention deficit disorder)   . Anxiety   . Cervical radiculopathy 2014   L arm (into L4th and 5th fingers)  . Depression   . H/O shoulder surgery    bilateral  . Interstitial cystitis     Family History  Problem Relation Age of Onset  . Stroke Brother   . Diabetes Brother   . Hyperlipidemia Brother   . Heart disease Father   . Stroke Father   . Mental illness Father   . Mental illness Sister   . Colon cancer Maternal Grandmother   . Colon cancer Maternal Uncle   . Breast cancer Neg Hx     Past Surgical History:  Procedure Laterality Date  . ABDOMINAL HYSTERECTOMY     complete, benign pathology  . bilateral shoulder surgery    . BLADDER SURGERY     due to interstitial cystitis  . BREAST REDUCTION SURGERY     age 49yo  . KNEE SURGERY     both knees  . REDUCTION MAMMAPLASTY     Social History   Occupational History  . Not on file  Tobacco Use  . Smoking status: Former Smoker    Last attempt to quit: 08/04/1990    Years since quitting: 27.7  . Smokeless tobacco: Never Used  Substance and Sexual Activity  . Alcohol use: Yes    Comment: rare  . Drug use: No  . Sexual activity: Yes    Birth control/protection: Surgical

## 2018-05-13 ENCOUNTER — Encounter (INDEPENDENT_AMBULATORY_CARE_PROVIDER_SITE_OTHER): Payer: Self-pay | Admitting: Orthopaedic Surgery

## 2018-05-13 ENCOUNTER — Telehealth (INDEPENDENT_AMBULATORY_CARE_PROVIDER_SITE_OTHER): Payer: Self-pay | Admitting: Radiology

## 2018-05-13 DIAGNOSIS — M23221 Derangement of posterior horn of medial meniscus due to old tear or injury, right knee: Secondary | ICD-10-CM | POA: Insufficient documentation

## 2018-05-13 NOTE — Telephone Encounter (Signed)
Patient emailed me-  Hello. I sure hope I am not bugging you sending e-mails. When I was in last Monday the 20th, we decided I was going to have knee surgery. I still haven't heard from anyone about when I can schedule it or if insurance is giving them a problem...should I have heard from someone by now?  Is there a blue sheet for this?  Is Ninfa Linden doing the surgery, or you?

## 2018-05-13 NOTE — Telephone Encounter (Signed)
  Here is the message on that one I asked you about earlier.  I will let patient know the status, that he just ordered it today. Please contact her when you are able. Thanks so much.

## 2018-05-13 NOTE — Telephone Encounter (Signed)
I sent the patient to Dr. Marlou Sa for a second opinion in regards to whether or not we should try arthroscopic surgery on her right knee.  He says he told her that it is a 50-50 endeavor and he told me it is worth trying to scope her knee.  I will fill out a surgery schedule/[employer].  Please let the patient know that Dr. Marlou Sa let me know that would be the next step and we will certainly be happy to comply with this and will try a scope on her knee to see if we can get her feeling better she would like [place] to go ahead and set that up.

## 2018-05-13 NOTE — Telephone Encounter (Signed)
Cb doing arthroscopy and debridement pls talk to him thx

## 2018-05-13 NOTE — Telephone Encounter (Signed)
See notes, can you advise if surgery blue sheet has been filled out yet?  Sherrie does not have anything on her.

## 2018-05-14 MED FILL — BUPROPION HCL XL 300 MG TAB: 300 | 30 days supply | Qty: 30 | Fill #0

## 2018-05-17 MED FILL — LOSARTAN POTASSIUM 50 MG TA: 50 | 90 days supply | Qty: 90 | Fill #0

## 2018-05-17 MED FILL — ESTROGEN-METHYLTESTOS H.S.: 0.625-1.25 | 30 days supply | Qty: 30 | Fill #0 | Status: TO

## 2018-05-18 MED FILL — ADDERALL XR 15 MG CAP SA: 15 | 30 days supply | Qty: 30 | Fill #0

## 2018-05-21 NOTE — Telephone Encounter (Signed)
Spoke with patient and scheduled surgery. °

## 2018-05-26 MED FILL — diazePAM 5 MG TABS: 5 | 2 days supply | Qty: 2 | Fill #0

## 2018-05-28 MED FILL — traZODone HCL 50 MG TABS: 50 | 30 days supply | Qty: 15 | Fill #0 | Status: TO

## 2018-06-01 ENCOUNTER — Telehealth (INDEPENDENT_AMBULATORY_CARE_PROVIDER_SITE_OTHER): Payer: Self-pay | Admitting: Radiology

## 2018-06-01 NOTE — Telephone Encounter (Signed)
[place]- email from patient   Today, 3:07 PM  Hi Gregary Signs,  I am so sorry you are going through this. I understand your frustration. I looked back at your chart and you were seen by Dr Ninfa Linden on 03/15/18 to review the MRI scan of the left knee. The appointment date that you were not seen was 04/05/18, and no charges were entered for that date. As far as the 05/04/18 visit with Dr Marlou Sa, I would ask them if you needed a referral to a different physician in the same facility? I wonder if they realize it is the same facility, and if that makes a difference. Again, I am so sorry for your troubles with all this. If there is anything more that I can do to assist, please let me know.  Ralph Leyden, Gregary Signs   Reply   Today, 2:48 PM  Dastan Krider, [place] again, I got a bill from [facility] regarding my April 1st visit and I was alittle confused because that was the appointment when I never saw the doctor due to insurance never was called.Marland KitchenMarland KitchenI am sure you remember. Also when I was speaking with [employer], they also informed me that I NEVER got a referral to Dr. Ninfa Linden so they are not paying what they should. I was furious because I was told all that was taken care of so now I have to appeal the APRIL 1st visit (which the insurance should have never been charged for that visit) and I have to appeal the Sira Adsit 21st visit. Can you please find out what they did with that April 1st visit? I told them I am having surgery the end of August so they NOW have a referral IN for Dr. Ninfa Linden that will last till October.... Rebecka Apley [employer] [phone]

## 2018-06-03 ENCOUNTER — Telehealth (INDEPENDENT_AMBULATORY_CARE_PROVIDER_SITE_OTHER): Payer: Self-pay | Admitting: Radiology

## 2018-06-03 NOTE — Telephone Encounter (Signed)
Email communication with patient:  Kathie, Posa     Today, 9:01 AM  Gregary Signs,  I have looked back in your chart at the referrals for the MRI scans. We did get authorization for those, so if you have issue with them covering the MRI scans, let me know. As far as the surgery auth, we will be getting that done for you as well. We have one person who does surgery auths, and I spoke to her about yours specifically. We will stay on top of that.  Let me know if I can assist further. Hope you have a great rest of your day! Leanora Cover     Reply   Tue 6/18, 3:15 PM  Rachell Druckenmiller  ok then that makes sense. They are saying I did not have a referral for April 1st OR Sachin Ferencz 21st so I am appealing them as we speak. I know this plan is new to everyone but that is ridiculous. They said that your office is the one that gets the referral for the MRI's and surgery but when we called about the MRI the guy said we wouldn't need any other referrals.  Rebecka Apley [employer] [phone]

## 2018-06-10 IMAGING — US US ABDOMEN COMPLETE
1 series · 14 of 25 positions shown · non-contrast
Comparison: 04/20/2013

CLINICAL DATA: Epigastric abdominal pain

EXAM:
ABDOMEN ULTRASOUND COMPLETE

[Series 1: us abdomen complete · 0.22mm/px · 14 of 96 slices shown]
[im 1/96]
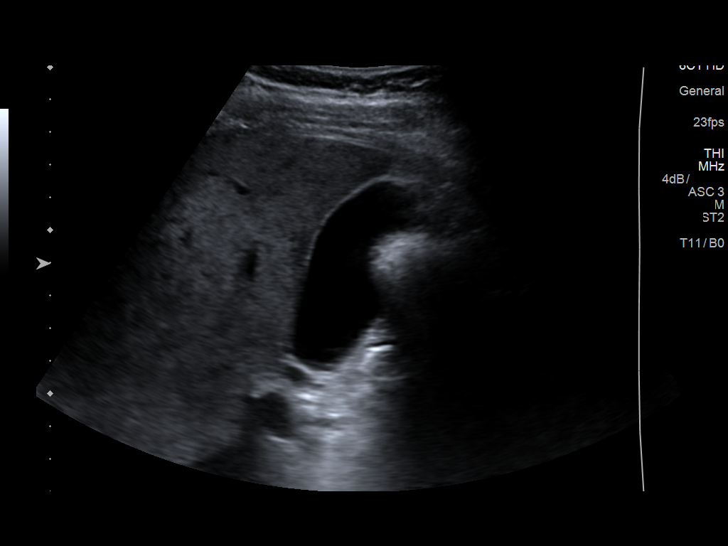
[im 8/96]
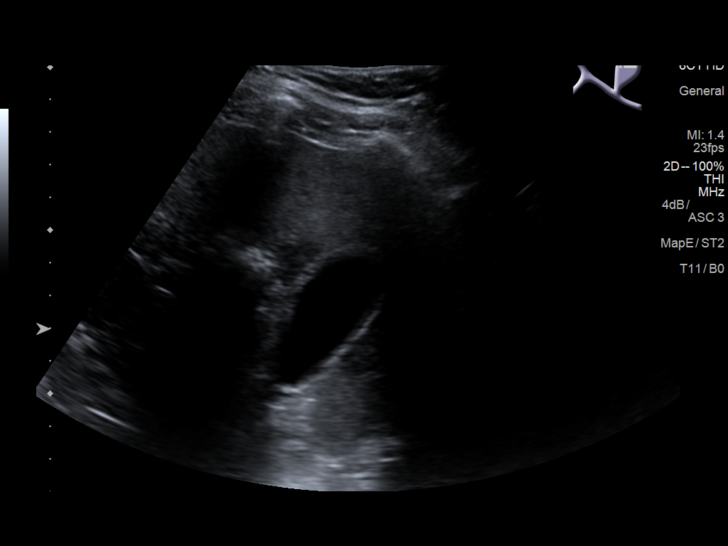
[im 16/96]
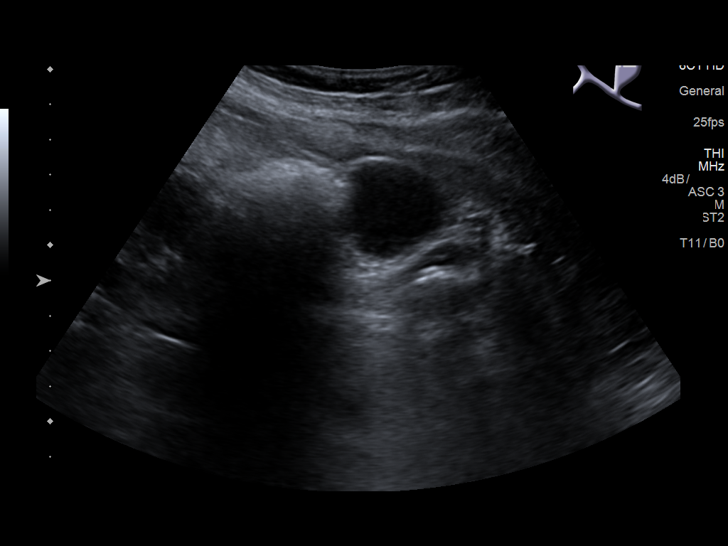
[im 24/96]
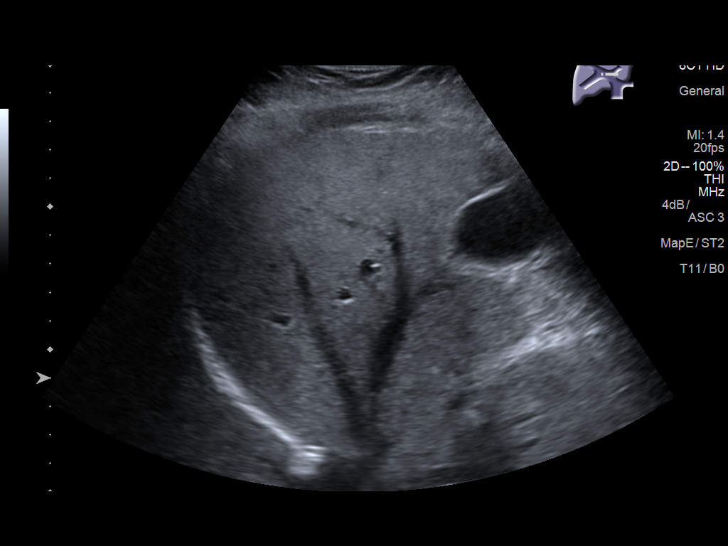
[im 32/96]
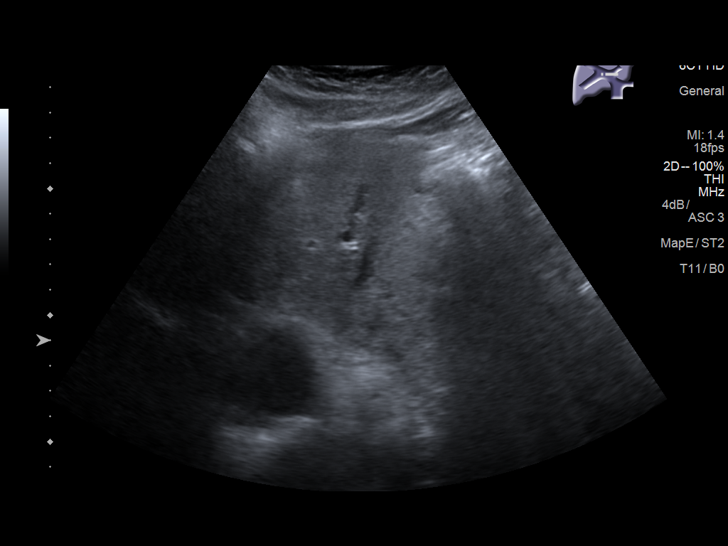
[im 36/96]
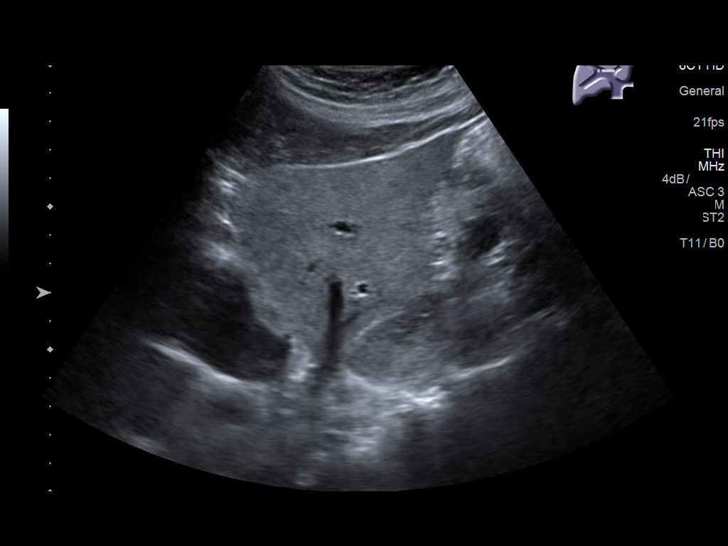
[im 44/96]
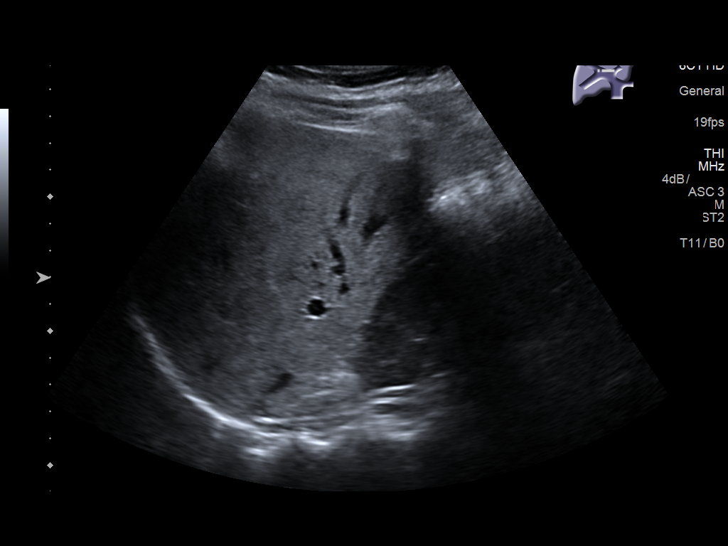
[im 52/96]
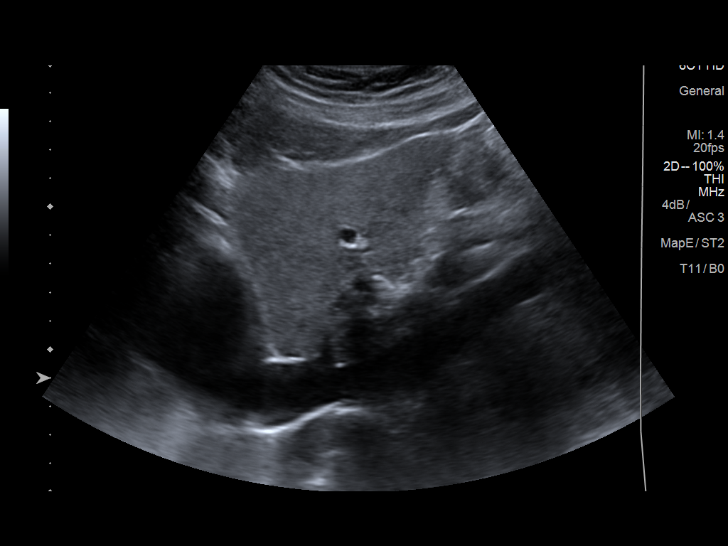
[im 60/96]
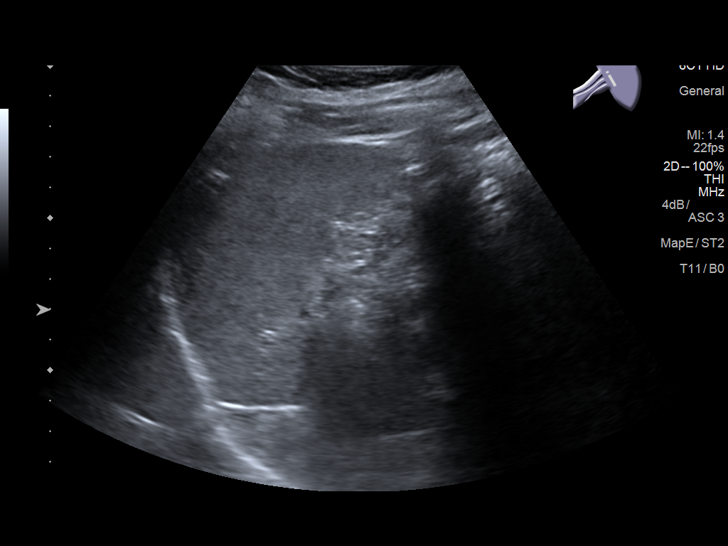
[im 64/96]
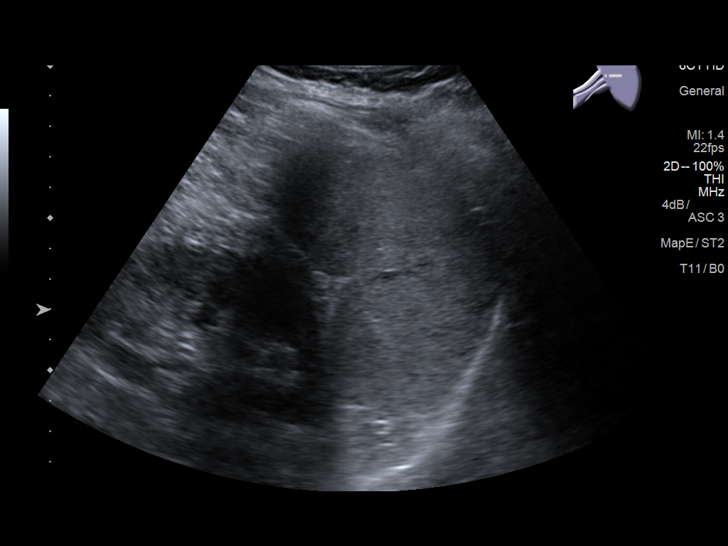
[im 72/96]
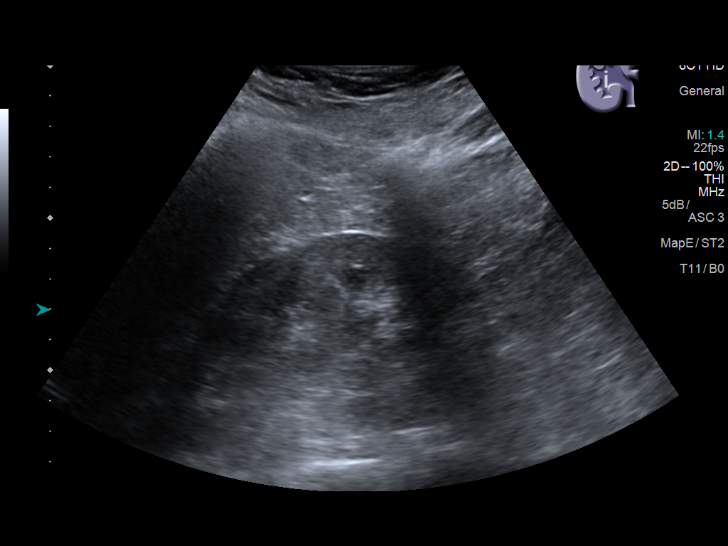
[im 80/96]
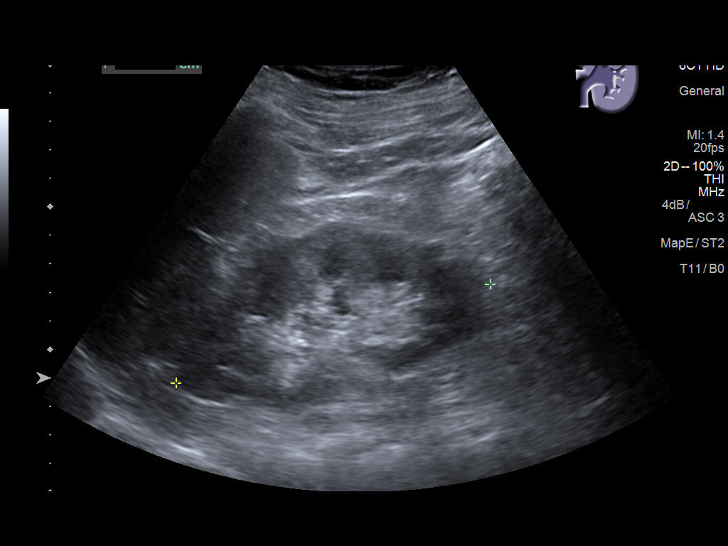
[im 88/96]
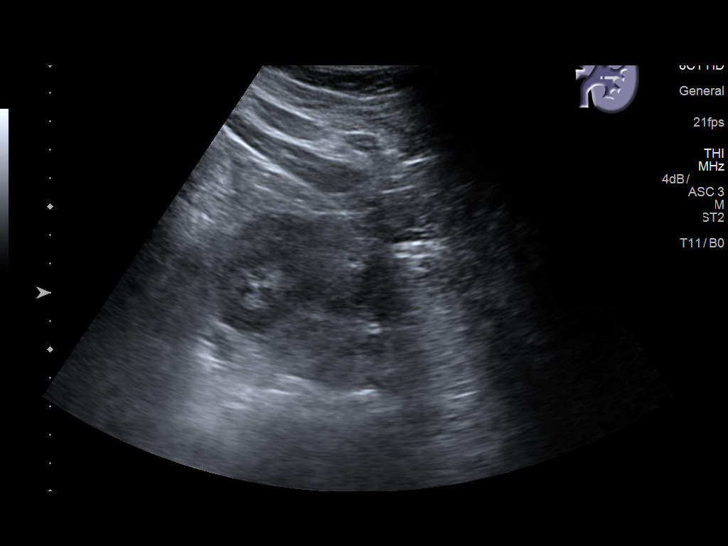
[im 96/96]
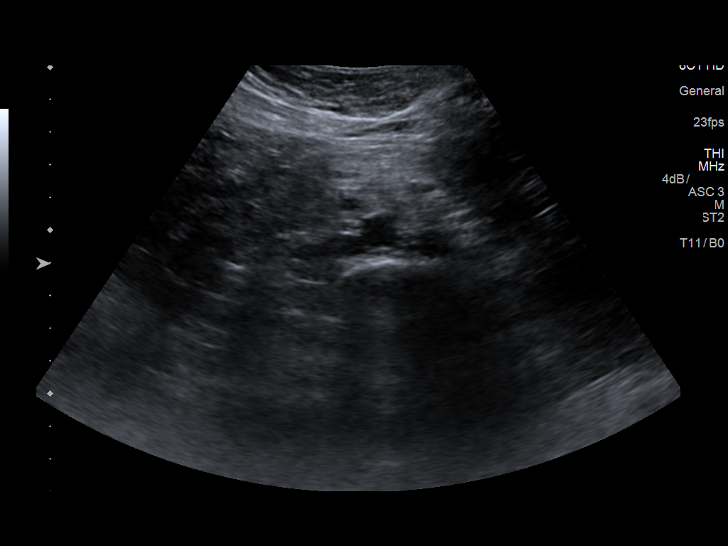

[14 of 25 positions shown; findings below may reference images not displayed]

FINDINGS: Gallbladder: Normally distended without stones or wall thickening.
No pericholecystic fluid or sonographic Murphy sign.

Common bile duct: Diameter: 3 mm diameter, normal

Liver: Echogenic, likely fatty infiltration, though this can be seen
with cirrhosis and certain infiltrative disorders. No focal hepatic
mass or nodularity. Hepatopetal portal venous flow.

IVC: Normal appearance

Pancreas: Normal appearance

Spleen: Normal appearance, 7.7 cm length

Right Kidney: Length: 11.1 cm. Normal morphology without mass or
hydronephrosis.

Left Kidney: Length: 11.5 cm. Normal morphology without mass or
hydronephrosis.

Abdominal aorta: Normal caliber

Other findings: No free fluid
IMPRESSION: Probable fatty infiltration liver as above.

Otherwise negative exam.

## 2018-06-11 MED FILL — buPROPion HCL ER (XL) 300 M: 300 | 30 days supply | Qty: 30 | Fill #0

## 2018-06-21 MED FILL — ADDERALL XR 15 MG CAP SA: 15 | 30 days supply | Qty: 30 | Fill #0

## 2018-06-21 MED FILL — EEMT HS 0.625-1.25 MG TAB: 0.625-1.25 | 30 days supply | Qty: 30 | Fill #0

## 2018-06-21 MED FILL — BENZONATATE 100 MG CAPS: 100 | 5 days supply | Qty: 15 | Fill #0

## 2018-06-22 MED FILL — HYDROCODONE-HOMATROPINE SOL: 5-1.5 | 5 days supply | Qty: 100 | Fill #0

## 2018-06-29 MED FILL — traZODone HCL 50 MG TABS: 50 | 30 days supply | Qty: 15 | Fill #0

## 2018-07-02 MED FILL — HYDROCHLOROTHIAZIDE 25 MG T: 25 | 30 days supply | Qty: 30 | Fill #0

## 2018-07-12 MED FILL — buPROPion HCL ER (XL) 300 M: 300 | 30 days supply | Qty: 30 | Fill #1

## 2018-07-21 MED FILL — EEMT HS 0.625-1.25 MG TAB: 0.625-1.25 | 30 days supply | Qty: 30 | Fill #1

## 2018-07-21 MED FILL — ADDERALL XR 15 MG CAP SA: 15 | 30 days supply | Qty: 30 | Fill #0

## 2018-07-29 ENCOUNTER — Other Ambulatory Visit (INDEPENDENT_AMBULATORY_CARE_PROVIDER_SITE_OTHER): Payer: Self-pay | Admitting: Physician Assistant

## 2018-07-29 MED FILL — traZODone HCL 50 MG TABS: 50 | 30 days supply | Qty: 15 | Fill #0

## 2018-07-29 NOTE — Patient Instructions (Addendum)
HARLEE ECKROTH  07/29/2018   Your procedure is scheduled on: 08/13/2018   Friday  Report to [facility] Main  Entrance              Report to admitting at     0700 AM    Call this number if you have problems the morning of surgery [phone]    Remember: Do not eat food or drink liquids :After Midnight.     Take these medicines the morning of surgery with A SIP OF WATER: Bupropion (Wellbutrin XL)                                You may not have any metal on your body including hair pins and              piercings  Do not wear jewelry, make-up, lotions, powders or perfumes, deodorant             Do not wear nail polish.  Do not shave  48 hours prior to surgery.                Do not bring valuables to the hospital. [place].  Contacts, dentures or bridgework may not be worn into surgery.      Patients discharged the day of surgery will not be allowed to drive home.  Name and phone number of your driver:spouse- Gerald Stabs or [facility] [place]                Please read over the following fact sheets you were given: _____________________________________________________________________             [facility] - Preparing for Surgery Before surgery, you can play an important role.  Because skin is not sterile, your skin needs to be as free of germs as possible.  You can reduce the number of germs on your skin by washing with CHG (chlorahexidine gluconate) soap before surgery.  CHG is an antiseptic cleaner which kills germs and bonds with the skin to continue killing germs even after washing. Please DO NOT use if you have an allergy to CHG or antibacterial soaps.  If your skin becomes reddened/irritated stop using the CHG and inform your nurse when you arrive at Short Stay. Do not shave (including legs and underarms) for at least 48 hours prior to the first CHG shower.  You may shave your face/neck. Please  follow these instructions carefully:  1.  Shower with CHG Soap the night before surgery and the  morning of Surgery.  2.  If you choose to wash your hair, wash your hair first as usual with your  normal  shampoo.  3.  After you shampoo, rinse your hair and body thoroughly to remove the  shampoo.                           4.  Use CHG as you would any other liquid soap.  You can apply chg directly  to the skin and wash                       Gently with a scrungie or clean washcloth.  5.  Apply the CHG Soap to your body ONLY FROM THE NECK DOWN.   Do not use on face/ open                           Wound or open sores. Avoid contact with eyes, ears mouth and genitals (private parts).                       Wash face,  Genitals (private parts) with your normal soap.             6.  Wash thoroughly, paying special attention to the area where your surgery  will be performed.  7.  Thoroughly rinse your body with warm water from the neck down.  8.  DO NOT shower/wash with your normal soap after using and rinsing off  the CHG Soap.                9.  Pat yourself dry with a clean towel.            10.  Wear clean pajamas.            11.  Place clean sheets on your bed the night of your first shower and do not  sleep with pets. Day of Surgery : Do not apply any lotions/deodorants the morning of surgery.  Please wear clean clothes to the hospital/surgery center.  FAILURE TO FOLLOW THESE INSTRUCTIONS MAY RESULT IN THE CANCELLATION OF YOUR SURGERY PATIENT SIGNATURE_________________________________  NURSE SIGNATURE__________________________________  ________________________________________________________________________

## 2018-07-29 NOTE — Progress Notes (Signed)
Need orders in epic.Preop on 8/16 at 1000am.  Surgery on 08/13/2018

## 2018-07-30 ENCOUNTER — Encounter (HOSPITAL_COMMUNITY): Payer: Self-pay

## 2018-07-30 ENCOUNTER — Encounter (HOSPITAL_COMMUNITY)
Admission: RE | Admit: 2018-07-30 | Discharge: 2018-07-30 | Disposition: A | Discharge status: Home / Self Care | Payer: No Typology Code available for payment source | Source: Ambulatory Visit | Attending: Orthopaedic Surgery | Admitting: Orthopaedic Surgery

## 2018-07-30 ENCOUNTER — Other Ambulatory Visit: Payer: Self-pay

## 2018-07-30 DIAGNOSIS — I499 Cardiac arrhythmia, unspecified: Secondary | ICD-10-CM | POA: Diagnosis not present

## 2018-07-30 DIAGNOSIS — G8929 Other chronic pain: Secondary | ICD-10-CM | POA: Diagnosis not present

## 2018-07-30 DIAGNOSIS — M25561 Pain in right knee: Secondary | ICD-10-CM | POA: Diagnosis not present

## 2018-07-30 DIAGNOSIS — Z01810 Encounter for preprocedural cardiovascular examination: Secondary | ICD-10-CM | POA: Insufficient documentation

## 2018-07-30 DIAGNOSIS — Z01812 Encounter for preprocedural laboratory examination: Secondary | ICD-10-CM | POA: Diagnosis present

## 2018-07-30 HISTORY — DX: Essential (primary) hypertension: I10

## 2018-07-30 LAB — BASIC METABOLIC PANEL
Anion gap: 6 (ref 5–15)
BUN: 14 mg/dL (ref 6–20)
CO2: 31 mmol/L (ref 22–32)
Calcium: 9.3 mg/dL (ref 8.9–10.3)
Chloride: 103 mmol/L (ref 98–111)
Creatinine, Ser: 1.24 mg/dL — ABNORMAL HIGH (ref 0.44–1.00)
GFR calc Af Amer: 58 mL/min — ABNORMAL LOW (ref >60)
GFR calc non Af Amer: 50 mL/min — ABNORMAL LOW (ref >60)
Glucose, Bld: 94 mg/dL (ref 70–99)
Potassium: 4.1 mmol/L (ref 3.5–5.1)
Sodium: 140 mmol/L (ref 135–145)

## 2018-07-30 LAB — CBC
HCT: 41.2 % (ref 36.0–46.0)
Hemoglobin: 14 g/dL (ref 12.0–15.0)
MCH: 31.3 pg (ref 26.0–34.0)
MCHC: 34 g/dL (ref 30.0–36.0)
MCV: 92 fL (ref 78.0–100.0)
Platelets: 230 10*3/uL (ref 150–400)
RBC: 4.48 MIL/uL (ref 3.87–5.11)
RDW: 12.4 % (ref 11.5–15.5)
WBC: 5.8 10*3/uL (ref 4.0–10.5)

## 2018-08-12 MED FILL — buPROPion HCL ER (XL) 300 M: 300 | 30 days supply | Qty: 30 | Fill #2

## 2018-08-12 MED FILL — LOSARTAN POTASSIUM 50 MG TA: 50 | 90 days supply | Qty: 90 | Fill #1

## 2018-08-12 NOTE — Anesthesia Preprocedure Evaluation (Signed)
Anesthesia Evaluation  Patient identified by MRN, date of birth, ID band Patient awake    Reviewed: Allergy & Precautions, NPO status , Patient's Chart, lab work & pertinent test results  Airway Mallampati: II  TM Distance: >3 FB Neck ROM: Full    Dental no notable dental hx.    Pulmonary neg pulmonary ROS, former smoker,    Pulmonary exam normal breath sounds clear to auscultation       Cardiovascular hypertension, negative cardio ROS Normal cardiovascular exam Rhythm:Regular Rate:Normal     Neuro/Psych negative neurological ROS  negative psych ROS   GI/Hepatic negative GI ROS, Neg liver ROS,   Endo/Other  negative endocrine ROS  Renal/GU negative Renal ROS     Musculoskeletal  (+) Arthritis ,   Abdominal   Peds  Hematology negative hematology ROS (+)   Anesthesia Other Findings   Reproductive/Obstetrics negative OB ROS                             Anesthesia Physical Anesthesia Plan  ASA: II  Anesthesia Plan: General   Post-op Pain Management:    Induction: Intravenous  PONV Risk Score and Plan: 4 or greater and Ondansetron and Dexamethasone  Airway Management Planned: LMA  Additional Equipment:   Intra-op Plan:   Post-operative Plan: Extubation in OR  Informed Consent: I have reviewed the patients History and Physical, chart, labs and discussed the procedure including the risks, benefits and alternatives for the proposed anesthesia with the patient or authorized representative who has indicated his/her understanding and acceptance.   Dental advisory given  Plan Discussed with: CRNA  Anesthesia Plan Comments:         Anesthesia Quick Evaluation

## 2018-08-13 ENCOUNTER — Ambulatory Visit (HOSPITAL_COMMUNITY): Payer: No Typology Code available for payment source | Admitting: Certified Registered Nurse Anesthetist

## 2018-08-13 ENCOUNTER — Ambulatory Visit (HOSPITAL_COMMUNITY)
Admission: RE | Admit: 2018-08-13 | Discharge: 2018-08-13 | Disposition: A | Discharge status: Home / Self Care | Payer: No Typology Code available for payment source | Source: Ambulatory Visit | Attending: Orthopaedic Surgery | Admitting: Orthopaedic Surgery

## 2018-08-13 ENCOUNTER — Encounter (HOSPITAL_COMMUNITY)
Admission: RE | Disposition: A | Discharge status: Home / Self Care | Payer: Self-pay | Source: Ambulatory Visit | Attending: Orthopaedic Surgery

## 2018-08-13 ENCOUNTER — Encounter (HOSPITAL_COMMUNITY): Payer: Self-pay

## 2018-08-13 DIAGNOSIS — Z800 Family history of malignant neoplasm of digestive organs: Secondary | ICD-10-CM | POA: Diagnosis not present

## 2018-08-13 DIAGNOSIS — F419 Anxiety disorder, unspecified: Secondary | ICD-10-CM | POA: Insufficient documentation

## 2018-08-13 DIAGNOSIS — I10 Essential (primary) hypertension: Secondary | ICD-10-CM | POA: Insufficient documentation

## 2018-08-13 DIAGNOSIS — Z818 Family history of other mental and behavioral disorders: Secondary | ICD-10-CM | POA: Insufficient documentation

## 2018-08-13 DIAGNOSIS — N3010 Interstitial cystitis (chronic) without hematuria: Secondary | ICD-10-CM | POA: Insufficient documentation

## 2018-08-13 DIAGNOSIS — F988 Other specified behavioral and emotional disorders with onset usually occurring in childhood and adolescence: Secondary | ICD-10-CM | POA: Diagnosis not present

## 2018-08-13 DIAGNOSIS — M25561 Pain in right knee: Secondary | ICD-10-CM | POA: Insufficient documentation

## 2018-08-13 DIAGNOSIS — Z87891 Personal history of nicotine dependence: Secondary | ICD-10-CM | POA: Diagnosis not present

## 2018-08-13 DIAGNOSIS — F329 Major depressive disorder, single episode, unspecified: Secondary | ICD-10-CM | POA: Diagnosis not present

## 2018-08-13 DIAGNOSIS — M1990 Unspecified osteoarthritis, unspecified site: Secondary | ICD-10-CM | POA: Diagnosis not present

## 2018-08-13 DIAGNOSIS — Z823 Family history of stroke: Secondary | ICD-10-CM | POA: Diagnosis not present

## 2018-08-13 DIAGNOSIS — Z90710 Acquired absence of both cervix and uterus: Secondary | ICD-10-CM | POA: Insufficient documentation

## 2018-08-13 DIAGNOSIS — M23221 Derangement of posterior horn of medial meniscus due to old tear or injury, right knee: Secondary | ICD-10-CM | POA: Diagnosis not present

## 2018-08-13 DIAGNOSIS — M94261 Chondromalacia, right knee: Secondary | ICD-10-CM | POA: Diagnosis not present

## 2018-08-13 DIAGNOSIS — Z79899 Other long term (current) drug therapy: Secondary | ICD-10-CM | POA: Insufficient documentation

## 2018-08-13 DIAGNOSIS — Z8249 Family history of ischemic heart disease and other diseases of the circulatory system: Secondary | ICD-10-CM | POA: Insufficient documentation

## 2018-08-13 DIAGNOSIS — Z833 Family history of diabetes mellitus: Secondary | ICD-10-CM | POA: Diagnosis not present

## 2018-08-13 HISTORY — PX: KNEE ARTHROSCOPY WITH MEDIAL MENISECTOMY: SHX5651

## 2018-08-13 SURGERY — ARTHROSCOPY, KNEE, WITH MEDIAL MENISCECTOMY
Anesthesia: General | Site: Knee | Laterality: Right

## 2018-08-13 MED ORDER — FENTANYL CITRATE (PF) 100 MCG/2ML IJ SOLN
INTRAMUSCULAR | Status: AC
  Filled 2018-08-13: qty 2

## 2018-08-13 MED ORDER — MIDAZOLAM HCL 5 MG/5ML IJ SOLN
INTRAMUSCULAR | Status: DC | PRN
  Administered 2018-08-13: 2 mg via INTRAVENOUS

## 2018-08-13 MED ORDER — BUPIVACAINE HCL (PF) 0.5 % IJ SOLN
INTRAMUSCULAR | Status: DC | PRN
  Administered 2018-08-13: 20 mL

## 2018-08-13 MED ORDER — HYDROMORPHONE HCL 1 MG/ML IJ SOLN: 0.2500 mg | INTRAMUSCULAR | Status: DC | PRN

## 2018-08-13 MED ORDER — MEPERIDINE HCL 50 MG/ML IJ SOLN
INTRAMUSCULAR | Status: AC
  Filled 2018-08-13: qty 1

## 2018-08-13 MED ORDER — OXYCODONE HCL 5 MG/5ML PO SOLN
5.0000 mg | Freq: Once | ORAL | Status: DC | PRN
  Filled 2018-08-13: qty 5

## 2018-08-13 MED ORDER — LIDOCAINE 2% (20 MG/ML) 5 ML SYRINGE
INTRAMUSCULAR | Status: AC
  Filled 2018-08-13: qty 5

## 2018-08-13 MED ORDER — DEXAMETHASONE SODIUM PHOSPHATE 10 MG/ML IJ SOLN
INTRAMUSCULAR | Status: DC | PRN
  Administered 2018-08-13: 10 mg via INTRAVENOUS

## 2018-08-13 MED ORDER — CHLORHEXIDINE GLUCONATE 4 % EX LIQD: 60.0000 mL | Freq: Once | CUTANEOUS | Status: DC

## 2018-08-13 MED ORDER — MORPHINE SULFATE (PF) 4 MG/ML IV SOLN
INTRAVENOUS | Status: DC | PRN
  Administered 2018-08-13: 3 mg

## 2018-08-13 MED ORDER — SCOPOLAMINE 1 MG/3DAYS TD PT72
1.0000 | MEDICATED_PATCH | TRANSDERMAL | Status: DC
  Administered 2018-08-13: 1.5 mg via TRANSDERMAL

## 2018-08-13 MED ORDER — ONDANSETRON HCL 4 MG/2ML IJ SOLN
INTRAMUSCULAR | Status: DC | PRN
  Administered 2018-08-13: 4 mg via INTRAVENOUS

## 2018-08-13 MED ORDER — MIDAZOLAM HCL 2 MG/2ML IJ SOLN
INTRAMUSCULAR | Status: AC
  Filled 2018-08-13: qty 2

## 2018-08-13 MED ORDER — CEFAZOLIN SODIUM-DEXTROSE 2-4 GM/100ML-% IV SOLN
2.0000 g | INTRAVENOUS | Status: AC
  Administered 2018-08-13: 2 g via INTRAVENOUS
  Filled 2018-08-13: qty 100

## 2018-08-13 MED ORDER — OXYCODONE HCL 5 MG PO TABS: 5.0000 mg | ORAL_TABLET | ORAL | 0 refills | Status: DC | PRN

## 2018-08-13 MED ORDER — LIDOCAINE 2% (20 MG/ML) 5 ML SYRINGE
INTRAMUSCULAR | Status: DC | PRN
  Administered 2018-08-13: 100 mg via INTRAVENOUS

## 2018-08-13 MED ORDER — DEXAMETHASONE SODIUM PHOSPHATE 10 MG/ML IJ SOLN
INTRAMUSCULAR | Status: AC
  Filled 2018-08-13: qty 1

## 2018-08-13 MED ORDER — LACTATED RINGERS IR SOLN
Status: DC | PRN
  Administered 2018-08-13: 3000 mL

## 2018-08-13 MED ORDER — PROMETHAZINE HCL 25 MG/ML IJ SOLN: 6.2500 mg | INTRAMUSCULAR | Status: DC | PRN

## 2018-08-13 MED ORDER — LACTATED RINGERS IV SOLN
INTRAVENOUS | Status: DC
  Administered 2018-08-13: 08:00:00 via INTRAVENOUS

## 2018-08-13 MED ORDER — BUPIVACAINE HCL (PF) 0.5 % IJ SOLN
INTRAMUSCULAR | Status: AC
  Filled 2018-08-13: qty 30

## 2018-08-13 MED ORDER — PROPOFOL 10 MG/ML IV BOLUS
INTRAVENOUS | Status: AC
  Filled 2018-08-13: qty 20

## 2018-08-13 MED ORDER — SCOPOLAMINE 1 MG/3DAYS TD PT72
MEDICATED_PATCH | TRANSDERMAL | Status: AC
  Filled 2018-08-13: qty 1

## 2018-08-13 MED ORDER — ONDANSETRON HCL 4 MG/2ML IJ SOLN
INTRAMUSCULAR | Status: AC
  Filled 2018-08-13: qty 2

## 2018-08-13 MED ORDER — PROPOFOL 10 MG/ML IV BOLUS
INTRAVENOUS | Status: DC | PRN
  Administered 2018-08-13: 180 mg via INTRAVENOUS

## 2018-08-13 MED ORDER — MEPERIDINE HCL 50 MG/ML IJ SOLN
6.2500 mg | INTRAMUSCULAR | Status: DC | PRN
  Administered 2018-08-13: 12.5 mg via INTRAVENOUS

## 2018-08-13 MED ORDER — FENTANYL CITRATE (PF) 100 MCG/2ML IJ SOLN
INTRAMUSCULAR | Status: DC | PRN
  Administered 2018-08-13 (×2): 50 ug via INTRAVENOUS

## 2018-08-13 MED ORDER — MORPHINE SULFATE (PF) 4 MG/ML IV SOLN
INTRAVENOUS | Status: AC
  Filled 2018-08-13: qty 1

## 2018-08-13 MED ORDER — OXYCODONE HCL 5 MG PO TABS: 5.0000 mg | ORAL_TABLET | Freq: Once | ORAL | Status: DC | PRN

## 2018-08-13 MED FILL — oxyCODONE HCL 5 MG TABS: 5 | 3 days supply | Qty: 40 | Fill #0

## 2018-08-13 SURGICAL SUPPLY — 26 items
BANDAGE ACE 6X5 VEL STRL LF (GAUZE/BANDAGES/DRESSINGS) ×2 IMPLANT
BLADE CUDA SHAVER 3.5 (BLADE) ×2 IMPLANT
COVER SURGICAL LIGHT HANDLE (MISCELLANEOUS) ×2 IMPLANT
CUFF TOURN SGL QUICK 34 (TOURNIQUET CUFF)
CUFF TRNQT CYL 34X4X40X1 (TOURNIQUET CUFF) IMPLANT
DRAPE U-SHAPE 47X51 STRL (DRAPES) ×2 IMPLANT
DRSG PAD ABDOMINAL 8X10 ST (GAUZE/BANDAGES/DRESSINGS) ×2 IMPLANT
DURAPREP 26ML APPLICATOR (WOUND CARE) ×2 IMPLANT
GAUZE XEROFORM 1X8 LF (GAUZE/BANDAGES/DRESSINGS) ×2 IMPLANT
GLOVE BIO SURGEON STRL SZ7.5 (GLOVE) ×2 IMPLANT
GLOVE BIOGEL PI IND STRL 8 (GLOVE) ×2 IMPLANT
GLOVE BIOGEL PI INDICATOR 8 (GLOVE) ×2
GLOVE ECLIPSE 8.0 STRL XLNG CF (GLOVE) ×2 IMPLANT
GOWN STRL REUS W/TWL XL LVL3 (GOWN DISPOSABLE) ×5 IMPLANT
IV LACTATED RINGER IRRG 3000ML (IV SOLUTION) ×4
IV LR IRRIG 3000ML ARTHROMATIC (IV SOLUTION) ×2 IMPLANT
KIT BASIN OR (CUSTOM PROCEDURE TRAY) IMPLANT
MANIFOLD NEPTUNE II (INSTRUMENTS) ×2 IMPLANT
PACK ARTHROSCOPY WL (CUSTOM PROCEDURE TRAY) ×2 IMPLANT
PADDING CAST COTTON 6X4 STRL (CAST SUPPLIES) ×2 IMPLANT
SUT ETHILON 4 0 PS 2 18 (SUTURE) ×2 IMPLANT
SYR CONTROL 10ML LL (SYRINGE) ×2 IMPLANT
TOWEL OR 17X26 10 PK STRL BLUE (TOWEL DISPOSABLE) ×2 IMPLANT
TUBING ARTHRO INFLOW-ONLY STRL (TUBING) ×2 IMPLANT
WAND HAND CNTRL MULTIVAC 90 (MISCELLANEOUS) IMPLANT
WRAP KNEE MAXI GEL POST OP (GAUZE/BANDAGES/DRESSINGS) ×2 IMPLANT

## 2018-08-13 NOTE — H&P (Signed)
[name] [name] is an 52 y.o. female.   Chief Complaint:   Chronic right knee pain HPI:   52 yo female with continued significant right knee pain.  Has had knee arthroscopy in the remote past.  Has continued mechanical symptoms including locking and catching.  Has daily pain and swelling.  Has failed conservative treatment efforts including rest, activity modification, quad strengthening, NSAIDS and injections.  Wishes to proceed with a surgical intervention at this point.  Past Medical History:  Diagnosis Date  . ADD (attention deficit disorder)   . Anxiety   . Cervical radiculopathy 2014   L arm (into L4th and 5th fingers)  . Depression   . Derangement of posterior horn of medial meniscus due to old tear or injury, right knee 05/13/2018  . H/O shoulder surgery    bilateral  . Hypertension   . Interstitial cystitis     Past Surgical History:  Procedure Laterality Date  . ABDOMINAL HYSTERECTOMY     complete, benign pathology  . bilateral shoulder surgery    . BLADDER SURGERY     due to interstitial cystitis  . BREAST REDUCTION SURGERY     age 41yo  . KNEE SURGERY     both knees  . REDUCTION MAMMAPLASTY      Family History  Problem Relation Age of Onset  . Stroke Brother   . Diabetes Brother   . Hyperlipidemia Brother   . Heart disease Father   . Stroke Father   . Mental illness Father   . Mental illness Sister   . Colon cancer Maternal Grandmother   . Colon cancer Maternal Uncle   . Breast cancer Neg Hx    Social History:  reports that she quit smoking about 28 years ago. She has never used smokeless tobacco. She reports that she drinks alcohol. She reports that she does not use drugs.  Allergies: No Known Allergies  Facility-Administered Medications Prior to Admission  Medication Dose Route Frequency Provider Last Rate Last Dose  . acetaminophen (TYLENOL) tablet 975 mg  975 mg Oral Once Copland, Gay Filler, MD       Medications Prior to Admission  Medication Sig  Dispense Refill  . acetaminophen (TYLENOL) 500 MG tablet Take 1,000 mg by mouth every 6 (six) hours as needed for mild pain or headache.    . amphetamine-dextroamphetamine (ADDERALL XR) 15 MG 24 hr capsule Take 1 capsule by mouth every morning. Fill on 03/11/16 30 capsule 0  . buPROPion (WELLBUTRIN XL) 300 MG 24 hr tablet Take 300 mg by mouth daily.    . diazepam (VALIUM) 5 MG tablet TAKE 1 TABLET BY MOUTH 30 MINS PRIOR TO PROCEDURE REPEAT IF NEEDED (Patient not taking: Reported on 07/30/2018) 2 tablet 0  . diazepam (VALIUM) 5 MG tablet Take 1 tablet 1 hour prior to MRI, repeat if needed. (Patient not taking: Reported on 07/30/2018) 3 tablet 0  . estrogen-methylTESTOSTERone (EST ESTROGENS-METHYLTEST HS) 0.625-1.25 MG tablet TAKE 1 TABLET BY MOUTH ONCE DAILY AT BEDTIME. 30 tablet 5  . hydrochlorothiazide (HYDRODIURIL) 25 MG tablet TAKE 1/2 TABLET BY MOUTH BID (Patient taking differently: Take 12.5 mg by mouth daily. ) 30 tablet 0  . losartan (COZAAR) 50 MG tablet Take 1 tablet (50 mg total) by mouth daily. 90 tablet 0  . traZODone (DESYREL) 50 MG tablet Take 50 mg by mouth at bedtime.    Marland Kitchen zolpidem (AMBIEN) 5 MG tablet TAKE 1 AND 1/2 TABLETS BY MOUTH AT BEDTIME AS NEEDED (Patient not  taking: Reported on 07/30/2018) 45 tablet 0    No results found for this or any previous visit (from the past 48 hour(s)). No results found.  Review of Systems  Musculoskeletal: Positive for joint pain.  All other systems reviewed and are negative.   Blood pressure (!) 150/98, pulse 70, temperature 98.2 F (36.8 C), temperature source Oral, resp. rate 18, height 5\' 6"  (1.676 m), weight 71.1 kg, SpO2 99 %. Physical Exam  Constitutional: She is oriented to person, place, and time. She appears well-developed and well-nourished.  HENT:  Head: Normocephalic and atraumatic.  Eyes: Pupils are equal, round, and reactive to light. EOM are normal.  Neck: Normal range of motion. Neck supple.  Cardiovascular: Normal rate.   Respiratory: Effort normal and breath sounds normal.  GI: Soft. Bowel sounds are normal.  Musculoskeletal:       Right knee: She exhibits decreased range of motion and swelling. Tenderness found. Medial joint line tenderness noted.  Neurological: She is alert and oriented to person, place, and time.  Skin: Skin is warm and dry.  Psychiatric: She has a normal mood and affect.     Assessment/Plan Right knee pain with medial compartment OA and questionable medial meniscal tear  To the OR today as an outpatient for a right knee arthroscopy.  Risks and benefits have been discussed in detail.  Mcarthur Rossetti, MD 08/13/2018, 7:14 AM

## 2018-08-13 NOTE — Anesthesia Postprocedure Evaluation (Signed)
Anesthesia Post Note  Patient: [name] [name]  Procedure(s) Performed: RIGHT KNEE ARTHROSCOPY WITH DEBRIDEMENT chondroplasty (Right Knee)     Patient location during evaluation: PACU Anesthesia Type: General Level of consciousness: sedated and patient cooperative Pain management: pain level controlled Vital Signs Assessment: post-procedure vital signs reviewed and stable Respiratory status: spontaneous breathing Cardiovascular status: stable Anesthetic complications: no    Last Vitals:  Vitals:   08/13/18 1105 08/13/18 1145  BP: (!) 159/83 (!) 144/80  Pulse: (!) 59 60  Resp: 14 14  Temp: 36.7 C 36.7 C  SpO2: 100% 100%    Last Pain:  Vitals:   08/13/18 1145  TempSrc:   PainSc: 0-No pain                 Nolon Nations

## 2018-08-13 NOTE — Op Note (Signed)
NAME: [name] [name], [name] [name] MEDICAL RECORD [id number] ACCOUNT [id number] DATE OF BIRTH:[birth year] FACILITY: [facility] LOCATION: [facility] PHYSICIAN:CHRISTOPHER Kerry Fort, MD  OPERATIVE REPORT  DATE OF PROCEDURE:  08/13/2018  PREOPERATIVE DIAGNOSIS:  Chronic right knee pain with chondromalacia and questionable medial meniscal tear.  POSTOPERATIVE DIAGNOSIS:  Chronic right knee pain with chondromalacia and questionable medial meniscal tear.  PROCEDURE:  Right knee arthroscopy with debridement and extensive chondroplasty of the trochlea groove and minimal chondroplasty of the medial compartment of the knee.  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  SURGEON:  Lind Guest. Ninfa Linden, MD  ASSISTANT:  Erskine Emery, PA-C.  ANESTHESIA: 1.  General. 2.  Local infiltration into the right knee joint and surgical site using a mixture of plain Marcaine and morphine.  COMPLICATIONS:  None.  ANTIBIOTICS:  2 grams IV Ancef.  INDICATIONS:  The patient is a 52 year old active female with debilitating pain involving both her knees.  She has had at least one arthroscopy on one knee and two on the other knee.  She is not sure which because it has been a while.  Her pain is daily  and has detrimentally affected her activities of daily living, quality of life and mobility.  She has tried and failed conservative treatment with activity modification.  She is a very thin individual and is already muscular and has good quad strength.   She has worked on anti-inflammatories and we tried the shots and steroid shots.  With continued pain, we did obtain an MRI of her knee that did not show any significant meniscal tearing.  It did show thinning of the cartilage in her knee.  With this  being said, she did wish for at least another arthroscopic intervention to see if this could buy her some  with her knee.  DESCRIPTION OF PROCEDURE:  After informed consent was obtained, the appropriate right knee was marked.  She was brought to the operating room and placed supine on the operating table.  General anesthesia was then obtained.  Her right thigh, knee, leg and  ankle were prepped and draped with DuraPrep and sterile drapes including a sterile stockinette with the bed raised and a lateral leg post-utilized.  The right operative knee was flexed off the side table.  Timeout was called to identify correct patient  and correct right knee.  I then made an anterolateral arthroscopy portal and inserted a cannula knee did not find any significant effusion.  We went into the medial compartment of the knee and placed anterior medial portal.  I did not find any meniscal  tear at all.  There was some thinning of the cartilage on the far medial aspect of the medial femoral condyle and medial tibial plateau, but it was not full thickness.  ACL and PCL were intact.  Lateral meniscus was also pristine.  In the patellofemoral  joint, we found a large area of the trochlea groove of exposed bone with fibrinous cartilage.  Using arthroscopic shaver through the medial compartment, we performed an extensive chondroplasty of the trochlea groove.  We probed all other structures in  the knee and found them to be intact.  Again, there is thinning of the far medial aspect of her knee.  Once we performed chondroplasty, we then allowed fluid to lavage the knee.  We then removed all the fluid from the knee and all instrumentation.  We closed the portal  sites with interrupted nylon suture.  We inserted a mixture of Marcaine and morphine  into the knee.  A well-padded sterile dressing was applied.  She was awakened, extubated, and taken to recovery room in stable condition.  All final counts were correct.  There were no complications noted.  Postoperatively, she will be discharged to home  from the PACU with followup in the  office in a week.  TN/NUANCE  D:08/13/2018 T:08/13/2018 JOB:[id number]

## 2018-08-13 NOTE — Discharge Instructions (Signed)
Increase your activities as comfort allows. You may put full weight as tolerated on your right leg and bend your right knee as tolerated. Expect swelling - ice and elevation as needed. Do pump your feet and ankles several times throughout the day. You can remove all over your dressings tomorrow 8/31 and shower, getting your incisions wet. Place small band-aids daily over your incisions.

## 2018-08-13 NOTE — Anesthesia Procedure Notes (Signed)
Procedure Name: LMA Insertion Date/Time: 08/13/2018 9:07 AM Performed by: Montel Clock, CRNA Pre-anesthesia Checklist: Patient identified, Emergency Drugs available, Suction available, Patient being monitored and Timeout performed Patient Re-evaluated:Patient Re-evaluated prior to induction Oxygen Delivery Method: Circle system utilized Preoxygenation: Pre-oxygenation with 100% oxygen Induction Type: IV induction Ventilation: Mask ventilation without difficulty LMA: LMA with gastric port inserted LMA Size: 4.0 Number of attempts: 1 Dental Injury: Teeth and Oropharynx as per pre-operative assessment

## 2018-08-13 NOTE — Transfer of Care (Signed)
Immediate Anesthesia Transfer of Care Note  Patient: [name] [name]  Procedure(s) Performed: RIGHT KNEE ARTHROSCOPY WITH DEBRIDEMENT (Right Knee)  Patient Location: PACU  Anesthesia Type:General  Level of Consciousness: drowsy  Airway & Oxygen Therapy: Patient Spontanous Breathing and Patient connected to face mask oxygen  Post-op Assessment: Report given to RN and Post -op Vital signs reviewed and stable  Post vital signs: Reviewed and stable  Last Vitals:  Vitals Value Taken Time  BP    Temp    Pulse    Resp    SpO2      Last Pain:  Vitals:   08/13/18 0730  TempSrc:   PainSc: 8       Patients Stated Pain Goal: 5 (37/16/96 7893)  Complications: No apparent anesthesia complications

## 2018-08-13 NOTE — Brief Op Note (Signed)
08/13/2018  9:45 AM  PATIENT:  [name] [name]  52 y.o. female  PRE-OPERATIVE DIAGNOSIS:  right knee medial meniscal tear, synovitis, chondromalacia  POST-OPERATIVE DIAGNOSIS:  * No post-op diagnosis entered *  PROCEDURE:  Procedure(s): RIGHT KNEE ARTHROSCOPY WITH DEBRIDEMENT (Right) Chondroplasty  SURGEON:  Surgeon(s) and Role:    Mcarthur Rossetti, MD - Primary  PHYSICIAN ASSISTANT: Benita Stabile, PA-C  ANESTHESIA:   local and general  COUNTS:  YES  DICTATION: .Other Dictation: Dictation Number [id number]  PLAN OF CARE: Discharge to home after PACU  PATIENT DISPOSITION:  PACU - hemodynamically stable.   Delay start of Pharmacological VTE agent (>24hrs) due to surgical blood loss or risk of bleeding: no

## 2018-08-14 ENCOUNTER — Encounter (HOSPITAL_COMMUNITY): Payer: Self-pay | Admitting: Orthopaedic Surgery

## 2018-08-18 MED FILL — EEMT HS 0.625-1.25 MG TAB: 0.625-1.25 | 30 days supply | Qty: 30 | Fill #2

## 2018-08-22 MED FILL — ADDERALL XR 15 MG CAP SA: 15 | 30 days supply | Qty: 30 | Fill #0

## 2018-08-23 ENCOUNTER — Ambulatory Visit (INDEPENDENT_AMBULATORY_CARE_PROVIDER_SITE_OTHER): Payer: No Typology Code available for payment source | Admitting: Physician Assistant

## 2018-08-23 ENCOUNTER — Encounter (INDEPENDENT_AMBULATORY_CARE_PROVIDER_SITE_OTHER): Payer: Self-pay | Admitting: Physician Assistant

## 2018-08-23 DIAGNOSIS — Z9889 Other specified postprocedural states: Secondary | ICD-10-CM

## 2018-08-23 NOTE — Progress Notes (Signed)
HPI:Mrs. Uncapher returns today status post right knee arthroscopy.  She was found to have grade IV chondromalacia trochlear groove with grade II-III chondromalacia the medial femoral condyle.  Intact meniscus medial lateral compartments.  She states overall she is doing very well.  She has no pain in the knee is very happy with her results.  She denies any swelling chest pain shortness of breath fevers chills.  Physical exam: Right knee she has full extension flexion beyond 90 degrees.  Port sites are well approximated with interrupted nylon sutures no signs of infection.  Calf supple nontender.  Impression: Status post right knee arthroscopy 1 week  Plan: She will work on Forensic scientist.  We will see her back in a month check progress lack of sutures removed.  She is encouraged to work on scar tissue mobilization.  Follow-up sooner if there is any questions concerns.  She is given a note to return to work full duties tomorrow without restrictions.

## 2018-08-27 MED FILL — traZODone HCL 50 MG TABS: 50 | 30 days supply | Qty: 15 | Fill #1

## 2018-09-01 MED FILL — HYDROCHLOROTHIAZIDE 25 MG T: 25 | 30 days supply | Qty: 30 | Fill #1

## 2018-09-03 MED FILL — buPROPion HCL ER (XL) 300 M: 300 | 30 days supply | Qty: 30 | Fill #0

## 2018-09-08 ENCOUNTER — Telehealth (INDEPENDENT_AMBULATORY_CARE_PROVIDER_SITE_OTHER): Payer: Self-pay | Admitting: Orthopaedic Surgery

## 2018-09-08 NOTE — Telephone Encounter (Signed)
Patient states she's having some "popping and pain" with activity. I told her to stop the activity for now/ice/elevate/NSAIDS until she sees [place] in the office  I told her to call me if the pain got worse before her appointment

## 2018-09-08 NOTE — Telephone Encounter (Signed)
Patient called and stated she is having some trouble with knee she had surgery on and didn't want an appt wanted to speak with [facility]'s office to inquire about a few things.  Please call patient to advise.  [phone] until 3:30 [phone] after 4:00

## 2018-09-15 MED FILL — EEMT HS 0.625-1.25 MG TAB: 0.625-1.25 | 30 days supply | Qty: 30 | Fill #3

## 2018-09-20 MED FILL — ADDERALL XR 15 MG CAP SA: 15 | 30 days supply | Qty: 30 | Fill #0

## 2018-09-21 ENCOUNTER — Ambulatory Visit (INDEPENDENT_AMBULATORY_CARE_PROVIDER_SITE_OTHER): Payer: No Typology Code available for payment source | Admitting: Orthopaedic Surgery

## 2018-09-21 ENCOUNTER — Encounter (INDEPENDENT_AMBULATORY_CARE_PROVIDER_SITE_OTHER): Payer: Self-pay | Admitting: Orthopaedic Surgery

## 2018-09-21 DIAGNOSIS — M1711 Unilateral primary osteoarthritis, right knee: Secondary | ICD-10-CM

## 2018-09-21 DIAGNOSIS — Z9889 Other specified postprocedural states: Secondary | ICD-10-CM

## 2018-09-21 MED ORDER — METHYLPREDNISOLONE ACETATE 40 MG/ML IJ SUSP
40.0000 mg | INTRAMUSCULAR | Status: AC | PRN
  Administered 2018-09-21: 40 mg via INTRA_ARTICULAR

## 2018-09-21 MED ORDER — LIDOCAINE HCL 1 % IJ SOLN
3.0000 mL | INTRAMUSCULAR | Status: AC | PRN
  Administered 2018-09-21: 3 mL

## 2018-09-21 NOTE — Progress Notes (Signed)
Office Visit Note   Patient: [name] [name]           Date of Birth: 1966-12-09           MRN: [id number] Visit Date: 09/21/2018              Requested by: Maude Leriche, PA-C [place], [place] [id number] PCP: Maude Leriche, PA-C   Assessment & Plan: Visit Diagnoses:  1. S/P right knee arthroscopy   2. Unilateral primary osteoarthritis, right knee     Plan: I did place a steroid injection in her right knee today.  I counseled about quad strengthening exercises.  She is a perfect candidate for trying at least glucosamine or turmeric.  All question concerns were answered and addressed.  Follow-up at this point will be as needed.  If her insurance changes she is on a plan that does cover hyaluronic acid, that was the next step.  Follow-Up Instructions: Return if symptoms worsen or fail to improve.   Orders:  Orders Placed This Encounter  Procedures  . Large Joint Inj   No orders of the defined types were placed in this encounter.     Procedures: Large Joint Inj: R knee on 09/21/2018 8:44 AM Indications: diagnostic evaluation and pain Details: 22 G 1.5 in needle, superolateral approach  Arthrogram: No  Medications: 3 mL lidocaine 1 %; 40 mg methylPREDNISolone acetate 40 MG/ML Outcome: tolerated well, no immediate complications Procedure, treatment alternatives, risks and benefits explained, specific risks discussed. Consent was given by the patient. Immediately prior to procedure a time out was called to verify the correct patient, procedure, equipment, support staff and site/side marked as required. Patient was prepped and draped in the usual sterile fashion.       Clinical Data: No additional findings.   Subjective: Chief Complaint  Patient presents with  . Right Knee - Follow-up  The patient has known moderate arthritis in her right knee.  She is now 50 days out from a right knee arthroscopy debridement chondroplasty.  She is a perfect  candidate for hyaluronic acid but her insurance does not cover this.  She is here today to get a steroid shot in her knee.  She does report some tightness in the knee.  HPI  Review of Systems She currently denies any fever, chills, nausea, vomiting.  Objective: Vital Signs: There were no vitals taken for this visit.  Physical Exam She is alert and oriented x3 and in no acute distress Ortho Exam Examination of her right knee shows just a slight effusion but the patella tracks well and the range of motion is good and full. Specialty Comments:  No specialty comments available.  Imaging: No results found.   PMFS History: Patient Active Problem List   Diagnosis Date Noted  . Derangement of posterior horn of medial meniscus due to old tear or injury, right knee 05/13/2018  . Chronic pain of right knee 03/15/2018  . Chronic pain of left knee 03/15/2018  . Unilateral primary osteoarthritis, left knee 03/15/2018  . Unilateral primary osteoarthritis, right knee 03/15/2018  . H/O shoulder surgery   . Constipation 11/28/2015  . Family history of colon cancer 11/28/2015  . Rectal bleeding 11/28/2015  . Low back pain 11/28/2015  . Dysthymia 08/04/2012  . ADD (attention deficit disorder) 08/04/2012  . Sleep disturbance 08/04/2012  . IC (interstitial cystitis) 08/04/2012  . H/O hysterectomy for benign disease 04/07/2012   Past Medical History:  Diagnosis Date  .  ADD (attention deficit disorder)   . Anxiety   . Cervical radiculopathy 2014   L arm (into L4th and 5th fingers)  . Depression   . Derangement of posterior horn of medial meniscus due to old tear or injury, right knee 05/13/2018  . H/O shoulder surgery    bilateral  . Hypertension   . Interstitial cystitis     Family History  Problem Relation Age of Onset  . Stroke Brother   . Diabetes Brother   . Hyperlipidemia Brother   . Heart disease Father   . Stroke Father   . Mental illness Father   . Mental illness Sister     . Colon cancer Maternal Grandmother   . Colon cancer Maternal Uncle   . Breast cancer Neg Hx     Past Surgical History:  Procedure Laterality Date  . ABDOMINAL HYSTERECTOMY     complete, benign pathology  . bilateral shoulder surgery    . BLADDER SURGERY     due to interstitial cystitis  . BREAST REDUCTION SURGERY     age 9yo  . KNEE ARTHROSCOPY WITH MEDIAL MENISECTOMY Right 08/13/2018   Procedure: RIGHT KNEE ARTHROSCOPY WITH DEBRIDEMENT chondroplasty;  Surgeon: Mcarthur Rossetti, MD;  Location: [facility];  Service: Orthopedics;  Laterality: Right;  . KNEE SURGERY     both knees  . REDUCTION MAMMAPLASTY     Social History   Occupational History  . Not on file  Tobacco Use  . Smoking status: Former Smoker    Last attempt to quit: 08/04/1990    Years since quitting: 28.1  . Smokeless tobacco: Never Used  Substance and Sexual Activity  . Alcohol use: Yes    Comment: rare  . Drug use: No  . Sexual activity: Yes    Birth control/protection: Surgical

## 2018-09-28 MED FILL — traZODone HCL 50 MG TABS: 50 | 30 days supply | Qty: 15 | Fill #0

## 2018-10-07 MED FILL — buPROPion HCL ER (XL) 300 M: 300 | 30 days supply | Qty: 30 | Fill #1 | Status: TO

## 2018-10-19 MED FILL — EEMT HS 0.625-1.25 MG TAB: 0.625-1.25 | 30 days supply | Qty: 30 | Fill #4

## 2018-10-20 MED FILL — ADDERALL XR 15 MG CAP SA: 15 | 30 days supply | Qty: 30 | Fill #0

## 2018-10-28 MED FILL — HYDROCHLOROTHIAZIDE 25 MG T: 25 | 30 days supply | Qty: 30 | Fill #2

## 2018-10-29 MED FILL — traZODone HCL 50 MG TABS: 50 | 30 days supply | Qty: 15 | Fill #0

## 2018-11-09 MED FILL — buPROPion HCL ER (XL) 300 M: 300 | 30 days supply | Qty: 30 | Fill #0

## 2018-11-09 MED FILL — LOSARTAN POTASSIUM 50 MG TA: 50 | 30 days supply | Qty: 30 | Fill #0

## 2018-11-17 MED FILL — ESTROGEN-METHYLTESTOS H.S.: 0.625-1.25 | 30 days supply | Qty: 30 | Fill #0

## 2018-11-25 MED FILL — traZODone HCL 50 MG TABS: 50 | 30 days supply | Qty: 15 | Fill #1

## 2018-12-01 MED FILL — ADDERALL XR 15 MG CAP SA: 15 | 30 days supply | Qty: 30 | Fill #0

## 2018-12-09 MED FILL — buPROPion HCL ER (XL) 300 M: 300 | 30 days supply | Qty: 30 | Fill #1

## 2018-12-10 MED FILL — LOSARTAN POTASSIUM 50 MG TA: 50 | 30 days supply | Qty: 30 | Fill #1

## 2018-12-16 MED FILL — ESTROGEN-METHYLTESTOS H.S.: 0.625-1.25 | 30 days supply | Qty: 30 | Fill #1

## 2018-12-21 MED FILL — HYDROCHLOROTHIAZIDE 25 MG T: 25 | 90 days supply | Qty: 45 | Fill #0

## 2018-12-23 MED FILL — traZODone HCL 50 MG TABS: 50 | 30 days supply | Qty: 15 | Fill #0

## 2018-12-29 MED FILL — diazePAM 5 MG TABS: 5 | 1 days supply | Qty: 2 | Fill #0

## 2018-12-30 MED FILL — ADDERALL XR 15 MG CAP SA: 15 | 30 days supply | Qty: 30 | Fill #0

## 2019-01-10 MED FILL — LOSARTAN POTASSIUM 50 MG TA: 50 | 30 days supply | Qty: 30 | Fill #0

## 2019-01-10 MED FILL — buPROPion HCL ER (XL) 300 M: 300 | 30 days supply | Qty: 30 | Fill #0

## 2019-01-13 MED FILL — EEMT HS 0.625-1.25 MG TAB: 0.625-1.25 | 30 days supply | Qty: 30 | Fill #2

## 2019-01-27 MED FILL — traZODone HCL 50 MG TABS: 50 | 30 days supply | Qty: 15 | Fill #1 | Status: TO

## 2019-02-04 MED FILL — ADDERALL XR 15 MG CAP SA: 15 | 30 days supply | Qty: 30 | Fill #0

## 2019-02-07 MED FILL — LOSARTAN POTASSIUM 50 MG TA: 50 | 30 days supply | Qty: 30 | Fill #1

## 2019-02-07 MED FILL — buPROPion HCL ER (XL) 300 M: 300 | 30 days supply | Qty: 30 | Fill #1

## 2019-02-14 MED FILL — EEMT HS 0.625-1.25 MG TAB: 0.625-1.25 | 30 days supply | Qty: 30 | Fill #0

## 2019-02-25 MED FILL — traZODone HCL 50 MG TABS: 50 | 30 days supply | Qty: 15 | Fill #0 | Status: TO

## 2019-03-07 MED FILL — LOSARTAN POTASSIUM 50 MG TA: 50 | 30 days supply | Qty: 30 | Fill #2

## 2019-03-07 MED FILL — ADDERALL XR 15 MG CAP SA: 15 | 30 days supply | Qty: 30 | Fill #0

## 2019-03-09 MED FILL — buPROPion HCL ER (XL) 300 M: 300 | 30 days supply | Qty: 30 | Fill #0

## 2019-03-17 MED FILL — ESTROGEN-METHYLTESTOS H.S.: 0.625-1.25 | 30 days supply | Qty: 30 | Fill #3

## 2019-03-24 MED FILL — HYDROCHLOROTHIAZIDE 25 MG T: 25 | 90 days supply | Qty: 45 | Fill #1

## 2019-04-04 MED FILL — traZODone HCL 50 MG TABS: 50 | 30 days supply | Qty: 15 | Fill #0

## 2019-04-05 MED FILL — ADDERALL XR 15 MG CAP SA: 15 | 30 days supply | Qty: 30 | Fill #0

## 2019-04-09 MED FILL — LOSARTAN POTASSIUM 50 MG TA: 50 | 30 days supply | Qty: 30 | Fill #3

## 2019-04-09 MED FILL — buPROPion HCL ER (XL) 300 M: 300 | 30 days supply | Qty: 30 | Fill #1

## 2019-04-11 ENCOUNTER — Other Ambulatory Visit: Payer: Self-pay | Admitting: Physician Assistant

## 2019-04-11 DIAGNOSIS — Z1231 Encounter for screening mammogram for malignant neoplasm of breast: Secondary | ICD-10-CM

## 2019-04-13 MED FILL — EEMT HS 0.625-1.25 MG TAB: 0.625-1.25 | 30 days supply | Qty: 30 | Fill #0

## 2019-05-02 ENCOUNTER — Encounter (HOSPITAL_BASED_OUTPATIENT_CLINIC_OR_DEPARTMENT_OTHER): Payer: Self-pay | Admitting: *Deleted

## 2019-05-02 ENCOUNTER — Other Ambulatory Visit: Payer: Self-pay

## 2019-05-02 NOTE — Progress Notes (Addendum)
SPOKE W/  _ pt via phone     SCREENING SYMPTOMS OF COVID 19:   COUGH--  RUNNY NOSE---   SORE THROAT---  NASAL CONGESTION----  SNEEZING----  SHORTNESS OF BREATH---  DIFFICULTY BREATHING---  TEMP >100.0 -----  UNEXPLAINED BODY ACHES------  CHILLS --------   HEADACHES ---------  LOSS OF SMELL/ TASTE --------    ----  Pt answered no to all above symptoms   HAVE YOU OR ANY FAMILY MEMBER TRAVELLED PAST 14 DAYS OUT OF THE   COUNTY--- no STATE---- no COUNTRY---- no  HAVE YOU OR ANY FAMILY MEMBER BEEN EXPOSED TO ANYONE WITH COVID 19?   denies

## 2019-05-02 NOTE — Progress Notes (Addendum)
[facility] - Preparing for Surgery ?Before surgery, you can play an important role.  Because skin is not sterile, your skin needs to be as free of germs as possible.  You can reduce the number of germs on your skin by washing with CHG (chlorahexidine gluconate) soap before surgery.  CHG is an antiseptic cleaner which kills germs and bonds with the skin to continue killing germs even after washing. ?Please DO NOT use if you have an allergy to CHG or antibacterial soaps.  If your skin becomes reddened/irritated stop using the CHG and inform your nurse when you arrive at Short Stay. ?Do not shave (including legs and underarms) for at least 48 hours prior to the first CHG shower.  You may shave your face/neck. ?Please follow these instructions carefully: ? 1.  Shower with CHG Soap the night before surgery and the  morning of Surgery. ? 2.  If you choose to wash your hair, wash your hair first as usual with your  normal  shampoo. ? 3.  After you shampoo, rinse your hair and body thoroughly to remove the  shampoo.                           ? 4.  Use CHG as you would any other liquid soap.  You can apply chg directly  to the skin and wash  ?                     Gently with a scrungie or clean washcloth. ? 5.  Apply the CHG Soap to your body ONLY FROM THE NECK DOWN.   Do not use on face/ open      ?                     Wound or open sores. Avoid contact with eyes, ears mouth and genitals (private parts).  ?                     Wash face,  Genitals (private parts) with your normal soap. ?            6.  Wash thoroughly, paying special attention to the area where your surgery  will be performed. ? 7.  Thoroughly rinse your body with warm water from the neck down. ? 8.  DO NOT shower/wash with your normal soap after using and rinsing off  the CHG Soap. ?            9.  Pat yourself dry with a clean towel. ?           10.  Wear clean pajamas. ?           11.  Place clean sheets on your bed the night of your first shower and do  not  sleep with pets. ?Day of Surgery : ?Do not apply any lotions/deodorants the morning of surgery.  Please wear clean clothes to the hospital/surgery center. ?

## 2019-05-02 NOTE — Progress Notes (Signed)
Spoke w/ pt via phone for pre-op interview.  Pt verbalized understanding Npo after mn with exception clear liquids until 0430, at which time  to finish ensure pre-surgery drink, then nothing by mouth.  Will take wellbutrin am dos w/ sips of water.  Needs istat 8.  Current ekg in chart and epic.  Pt getting covid test done Friday 05-06-2019 @ 1540 and pick-up drink/ hibiclens with handout instructions at testing site.

## 2019-05-03 MED FILL — traZODone HCL 50 MG TABS: 50 | 30 days supply | Qty: 15 | Fill #1

## 2019-05-06 ENCOUNTER — Other Ambulatory Visit (HOSPITAL_COMMUNITY)
Admission: RE | Admit: 2019-05-06 | Discharge: 2019-05-06 | Disposition: A | Discharge status: Home / Self Care | Payer: No Typology Code available for payment source | Source: Ambulatory Visit | Attending: Orthopedic Surgery | Admitting: Orthopedic Surgery

## 2019-05-06 ENCOUNTER — Other Ambulatory Visit (HOSPITAL_COMMUNITY): Payer: No Typology Code available for payment source

## 2019-05-06 DIAGNOSIS — Z1159 Encounter for screening for other viral diseases: Secondary | ICD-10-CM | POA: Insufficient documentation

## 2019-05-07 LAB — NOVEL CORONAVIRUS, NAA (HOSP ORDER, SEND-OUT TO REF LAB; TAT 18-24 HRS): SARS-CoV-2, NAA: NOT DETECTED

## 2019-05-10 NOTE — Progress Notes (Signed)
SPOKE W/  Gregary Signs [place]     SCREENING SYMPTOMS OF COVID 19:   COUGH--no  RUNNY NOSE--- no  SORE THROAT---no  NASAL CONGESTION----no SNEEZING----  SHORTNESS OF BREATH---no  DIFFICULTY BREATHING---no  TEMP >100.0 -----no  UNEXPLAINED BODY ACHES------no  CHILLS -------- no  HEADACHES ---------no  LOSS OF SMELL/ TASTE --------no    HAVE YOU OR ANY FAMILY MEMBER TRAVELLED PAST 14 DAYS OUT OF THE   COUNTY---noSTATE----no COUNTRY----no  HAVE YOU OR ANY FAMILY MEMBER BEEN EXPOSED TO ANYONE WITH COVID 19? no

## 2019-05-11 ENCOUNTER — Ambulatory Visit (HOSPITAL_BASED_OUTPATIENT_CLINIC_OR_DEPARTMENT_OTHER)
Admission: RE | Admit: 2019-05-11 | Discharge: 2019-05-11 | Disposition: A | Discharge status: Home / Self Care | Payer: No Typology Code available for payment source | Attending: Orthopedic Surgery | Admitting: Orthopedic Surgery

## 2019-05-11 ENCOUNTER — Other Ambulatory Visit: Payer: Self-pay

## 2019-05-11 ENCOUNTER — Encounter (HOSPITAL_BASED_OUTPATIENT_CLINIC_OR_DEPARTMENT_OTHER): Payer: Self-pay | Admitting: *Deleted

## 2019-05-11 ENCOUNTER — Ambulatory Visit (HOSPITAL_BASED_OUTPATIENT_CLINIC_OR_DEPARTMENT_OTHER): Payer: No Typology Code available for payment source | Admitting: Anesthesiology

## 2019-05-11 ENCOUNTER — Encounter (HOSPITAL_BASED_OUTPATIENT_CLINIC_OR_DEPARTMENT_OTHER)
Admission: RE | Disposition: A | Discharge status: Home / Self Care | Payer: Self-pay | Source: Home / Self Care | Attending: Orthopedic Surgery

## 2019-05-11 DIAGNOSIS — I10 Essential (primary) hypertension: Secondary | ICD-10-CM | POA: Diagnosis not present

## 2019-05-11 DIAGNOSIS — M1990 Unspecified osteoarthritis, unspecified site: Secondary | ICD-10-CM | POA: Insufficient documentation

## 2019-05-11 DIAGNOSIS — F419 Anxiety disorder, unspecified: Secondary | ICD-10-CM | POA: Insufficient documentation

## 2019-05-11 DIAGNOSIS — F329 Major depressive disorder, single episode, unspecified: Secondary | ICD-10-CM | POA: Diagnosis not present

## 2019-05-11 DIAGNOSIS — M2241 Chondromalacia patellae, right knee: Secondary | ICD-10-CM | POA: Insufficient documentation

## 2019-05-11 DIAGNOSIS — Z79899 Other long term (current) drug therapy: Secondary | ICD-10-CM | POA: Insufficient documentation

## 2019-05-11 DIAGNOSIS — M23221 Derangement of posterior horn of medial meniscus due to old tear or injury, right knee: Secondary | ICD-10-CM | POA: Diagnosis present

## 2019-05-11 DIAGNOSIS — M23321 Other meniscus derangements, posterior horn of medial meniscus, right knee: Secondary | ICD-10-CM | POA: Insufficient documentation

## 2019-05-11 HISTORY — DX: Presence of spectacles and contact lenses: Z97.3

## 2019-05-11 HISTORY — DX: Unspecified tear of unspecified meniscus, current injury, right knee, initial encounter: S83.206A

## 2019-05-11 HISTORY — PX: CHONDROPLASTY: SHX5177

## 2019-05-11 HISTORY — DX: Nausea with vomiting, unspecified: R11.2

## 2019-05-11 HISTORY — DX: Endometriosis, unspecified: N80.9

## 2019-05-11 HISTORY — DX: Other specified postprocedural states: Z98.890

## 2019-05-11 HISTORY — DX: Unspecified osteoarthritis, unspecified site: M19.90

## 2019-05-11 HISTORY — PX: KNEE ARTHROSCOPY: SHX127

## 2019-05-11 LAB — POCT I-STAT, CHEM 8
BUN: 13 mg/dL (ref 6–20)
Calcium, Ion: 1.21 mmol/L (ref 1.15–1.40)
Chloride: 104 mmol/L (ref 98–111)
Creatinine, Ser: 1 mg/dL (ref 0.44–1.00)
Glucose, Bld: 96 mg/dL (ref 70–99)
HCT: 38 % (ref 36.0–46.0)
Hemoglobin: 12.9 g/dL (ref 12.0–15.0)
Potassium: 3.5 mmol/L (ref 3.5–5.1)
Sodium: 140 mmol/L (ref 135–145)
TCO2: 25 mmol/L (ref 22–32)

## 2019-05-11 SURGERY — ARTHROSCOPY, KNEE
Anesthesia: General | Site: Knee | Laterality: Right

## 2019-05-11 MED ORDER — FENTANYL CITRATE (PF) 100 MCG/2ML IJ SOLN
INTRAMUSCULAR | Status: AC
  Filled 2019-05-11: qty 2

## 2019-05-11 MED ORDER — BUPIVACAINE-EPINEPHRINE 0.25% -1:200000 IJ SOLN
INTRAMUSCULAR | Status: DC | PRN
  Administered 2019-05-11: 20 mL

## 2019-05-11 MED ORDER — PROPOFOL 10 MG/ML IV BOLUS
INTRAVENOUS | Status: AC
  Filled 2019-05-11: qty 40

## 2019-05-11 MED ORDER — METHOCARBAMOL 500 MG PO TABS: 500.0000 mg | ORAL_TABLET | Freq: Three times a day (TID) | ORAL | 1 refills | Status: DC | PRN

## 2019-05-11 MED ORDER — POVIDONE-IODINE 10 % EX SWAB
2.0000 "application " | Freq: Once | CUTANEOUS | Status: DC
  Filled 2019-05-11: qty 2

## 2019-05-11 MED ORDER — SODIUM CHLORIDE 0.9 % IR SOLN
Status: DC | PRN
  Administered 2019-05-11 (×3): 3000 mL

## 2019-05-11 MED ORDER — OXYCODONE HCL 5 MG PO TABS
5.0000 mg | ORAL_TABLET | Freq: Once | ORAL | Status: AC | PRN
  Administered 2019-05-11: 5 mg via ORAL
  Filled 2019-05-11: qty 1

## 2019-05-11 MED ORDER — DEXAMETHASONE SODIUM PHOSPHATE 10 MG/ML IJ SOLN
INTRAMUSCULAR | Status: AC
  Filled 2019-05-11: qty 1

## 2019-05-11 MED ORDER — LACTATED RINGERS IV SOLN
INTRAVENOUS | Status: DC
  Administered 2019-05-11 (×2): via INTRAVENOUS
  Filled 2019-05-11: qty 1000

## 2019-05-11 MED ORDER — HYDROMORPHONE HCL 1 MG/ML IJ SOLN
0.2500 mg | INTRAMUSCULAR | Status: DC | PRN
  Administered 2019-05-11 (×4): 0.25 mg via INTRAVENOUS
  Filled 2019-05-11: qty 0.5

## 2019-05-11 MED ORDER — SCOPOLAMINE 1 MG/3DAYS TD PT72
1.0000 | MEDICATED_PATCH | TRANSDERMAL | Status: DC
  Administered 2019-05-11: 1.5 mg via TRANSDERMAL
  Filled 2019-05-11: qty 1

## 2019-05-11 MED ORDER — ONDANSETRON HCL 4 MG/2ML IJ SOLN
INTRAMUSCULAR | Status: DC | PRN
  Administered 2019-05-11: 4 mg via INTRAVENOUS

## 2019-05-11 MED ORDER — KETOROLAC TROMETHAMINE 30 MG/ML IJ SOLN
INTRAMUSCULAR | Status: DC | PRN
  Administered 2019-05-11: 30 mg via INTRAVENOUS

## 2019-05-11 MED ORDER — CEFAZOLIN SODIUM-DEXTROSE 2-4 GM/100ML-% IV SOLN
2.0000 g | INTRAVENOUS | Status: AC
  Administered 2019-05-11: 2 g via INTRAVENOUS
  Filled 2019-05-11: qty 100

## 2019-05-11 MED ORDER — KETOROLAC TROMETHAMINE 30 MG/ML IJ SOLN
INTRAMUSCULAR | Status: AC
  Filled 2019-05-11: qty 1

## 2019-05-11 MED ORDER — PROPOFOL 10 MG/ML IV BOLUS
INTRAVENOUS | Status: DC | PRN
  Administered 2019-05-11: 150 mg via INTRAVENOUS

## 2019-05-11 MED ORDER — HYDROCODONE-ACETAMINOPHEN 5-325 MG PO TABS: 1.0000 | ORAL_TABLET | Freq: Four times a day (QID) | ORAL | 0 refills | Status: DC | PRN

## 2019-05-11 MED ORDER — OXYCODONE HCL 5 MG PO TABS
ORAL_TABLET | ORAL | Status: AC
  Filled 2019-05-11: qty 1

## 2019-05-11 MED ORDER — OXYCODONE HCL 5 MG/5ML PO SOLN
5.0000 mg | Freq: Once | ORAL | Status: AC | PRN
  Filled 2019-05-11: qty 5

## 2019-05-11 MED ORDER — PROMETHAZINE HCL 25 MG/ML IJ SOLN
6.2500 mg | INTRAMUSCULAR | Status: DC | PRN
  Filled 2019-05-11: qty 1

## 2019-05-11 MED ORDER — HYDROMORPHONE HCL 1 MG/ML IJ SOLN
INTRAMUSCULAR | Status: AC
  Filled 2019-05-11: qty 1

## 2019-05-11 MED ORDER — CEFAZOLIN SODIUM-DEXTROSE 2-4 GM/100ML-% IV SOLN
INTRAVENOUS | Status: AC
  Filled 2019-05-11: qty 100

## 2019-05-11 MED ORDER — MIDAZOLAM HCL 5 MG/5ML IJ SOLN
INTRAMUSCULAR | Status: DC | PRN
  Administered 2019-05-11: 2 mg via INTRAVENOUS

## 2019-05-11 MED ORDER — LIDOCAINE 2% (20 MG/ML) 5 ML SYRINGE
INTRAMUSCULAR | Status: AC
  Filled 2019-05-11: qty 5

## 2019-05-11 MED ORDER — LACTATED RINGERS IV SOLN
INTRAVENOUS | Status: DC
  Administered 2019-05-11: 07:00:00 via INTRAVENOUS
  Filled 2019-05-11: qty 1000

## 2019-05-11 MED ORDER — DEXAMETHASONE SODIUM PHOSPHATE 4 MG/ML IJ SOLN
INTRAMUSCULAR | Status: DC | PRN
  Administered 2019-05-11: 10 mg via INTRAVENOUS

## 2019-05-11 MED ORDER — MEPERIDINE HCL 25 MG/ML IJ SOLN
6.2500 mg | INTRAMUSCULAR | Status: DC | PRN
  Filled 2019-05-11: qty 1

## 2019-05-11 MED ORDER — MIDAZOLAM HCL 2 MG/2ML IJ SOLN
INTRAMUSCULAR | Status: AC
  Filled 2019-05-11: qty 2

## 2019-05-11 MED ORDER — SCOPOLAMINE 1 MG/3DAYS TD PT72
MEDICATED_PATCH | TRANSDERMAL | Status: AC
  Filled 2019-05-11: qty 1

## 2019-05-11 MED ORDER — CHLORHEXIDINE GLUCONATE 4 % EX LIQD
60.0000 mL | Freq: Once | CUTANEOUS | Status: DC
  Filled 2019-05-11: qty 118

## 2019-05-11 MED ORDER — LIDOCAINE HCL (CARDIAC) PF 100 MG/5ML IV SOSY
PREFILLED_SYRINGE | INTRAVENOUS | Status: DC | PRN
  Administered 2019-05-11: 80 mg via INTRAVENOUS

## 2019-05-11 MED ORDER — ONDANSETRON HCL 4 MG/2ML IJ SOLN
INTRAMUSCULAR | Status: AC
  Filled 2019-05-11: qty 2

## 2019-05-11 MED ORDER — FENTANYL CITRATE (PF) 100 MCG/2ML IJ SOLN
INTRAMUSCULAR | Status: DC | PRN
  Administered 2019-05-11 (×8): 25 ug via INTRAVENOUS

## 2019-05-11 MED FILL — METHOCARBAMOL 500 MG TABLET: 500 | 10 days supply | Qty: 30 | Fill #0

## 2019-05-11 MED FILL — HYDROCODON-APAP 5-325: 5-325 | 7 days supply | Qty: 30 | Fill #0

## 2019-05-11 SURGICAL SUPPLY — 41 items
APL PRP STRL LF DISP 70% ISPRP (MISCELLANEOUS) ×1
BANDAGE ELASTIC 6 VELCRO ST LF (GAUZE/BANDAGES/DRESSINGS) ×2 IMPLANT
CANISTER SUCT 3000ML PPV (MISCELLANEOUS) IMPLANT
CHLORAPREP W/TINT 26 (MISCELLANEOUS) ×1 IMPLANT
CLOTH BEACON ORANGE TIMEOUT ST (SAFETY) ×2 IMPLANT
COVER WAND RF STERILE (DRAPES) ×2 IMPLANT
CUFF TOURN SGL QUICK 34 (TOURNIQUET CUFF)
CUFF TRNQT CYL 34X4X40X1 (TOURNIQUET CUFF) ×1 IMPLANT
DISSECTOR  3.8MM X 13CM (MISCELLANEOUS) ×1
DISSECTOR 3.8MM X 13CM (MISCELLANEOUS) IMPLANT
DRAPE ARTHROSCOPY W/POUCH 114 (DRAPES) ×2 IMPLANT
DRAPE U-SHAPE 47X51 STRL (DRAPES) ×2 IMPLANT
DRSG EMULSION OIL 3X3 NADH (GAUZE/BANDAGES/DRESSINGS) ×2 IMPLANT
DURAPREP 26ML APPLICATOR (WOUND CARE) ×1 IMPLANT
ELECT MENISCUS 165MM 90D (ELECTRODE) IMPLANT
ELECT REM PT RETURN 9FT ADLT (ELECTROSURGICAL)
ELECTRODE REM PT RTRN 9FT ADLT (ELECTROSURGICAL) IMPLANT
GAUZE SPONGE 4X4 12PLY STRL (GAUZE/BANDAGES/DRESSINGS) ×2 IMPLANT
GLOVE BIO SURGEON STRL SZ8 (GLOVE) ×2 IMPLANT
GLOVE INDICATOR 8.0 STRL GRN (GLOVE) ×2 IMPLANT
GOWN STRL REUS W/ TWL LRG LVL3 (GOWN DISPOSABLE) ×2 IMPLANT
GOWN STRL REUS W/TWL LRG LVL3 (GOWN DISPOSABLE) ×4
IV NS IRRIG 3000ML ARTHROMATIC (IV SOLUTION) ×1 IMPLANT
KIT TURNOVER CYSTO (KITS) ×2 IMPLANT
KNEE WRAP E Z 3 GEL PACK (MISCELLANEOUS) ×2 IMPLANT
MANIFOLD NEPTUNE II (INSTRUMENTS) ×1 IMPLANT
PACK ARTHROSCOPY DSU (CUSTOM PROCEDURE TRAY) ×2 IMPLANT
PACK BASIN DAY SURGERY FS (CUSTOM PROCEDURE TRAY) ×2 IMPLANT
PADDING CAST ABS 4INX4YD NS (CAST SUPPLIES) ×1
PADDING CAST ABS COTTON 4X4 ST (CAST SUPPLIES) ×1 IMPLANT
PADDING CAST COTTON 6X4 STRL (CAST SUPPLIES) ×2 IMPLANT
PENCIL BUTTON HOLSTER BLD 10FT (ELECTRODE) IMPLANT
PORT APPOLLO RF 90DEGREE MULTI (SURGICAL WAND) ×1 IMPLANT
PROBE BIPOLAR ATHRO 135MM 90D (MISCELLANEOUS) IMPLANT
SET ARTHROSCOPY TUBING (MISCELLANEOUS) ×2
SET ARTHROSCOPY TUBING LN (MISCELLANEOUS) ×1 IMPLANT
SUT ETHILON 4 0 PS 2 18 (SUTURE) ×2 IMPLANT
TOWEL OR 17X26 10 PK STRL BLUE (TOWEL DISPOSABLE) ×2 IMPLANT
TUBE CONNECTING 12X1/4 (SUCTIONS) ×1 IMPLANT
WAND 30 DEG SABER W/CORD (SURGICAL WAND) IMPLANT
WATER STERILE IRR 500ML POUR (IV SOLUTION) ×2 IMPLANT

## 2019-05-11 NOTE — Anesthesia Postprocedure Evaluation (Signed)
Anesthesia Post Note  Patient: [name] [name]  Procedure(s) Performed: ARTHROSCOPY KNEE, MEDIAL MENISCAL DEBRIDEMENT (Right Knee) CHONDROPLASTY (Right Knee)     Patient location during evaluation: PACU Anesthesia Type: General Level of consciousness: awake and alert Pain management: pain level controlled Vital Signs Assessment: post-procedure vital signs reviewed and stable Respiratory status: spontaneous breathing, nonlabored ventilation and respiratory function stable Cardiovascular status: blood pressure returned to baseline and stable Postop Assessment: no apparent nausea or vomiting Anesthetic complications: no    Last Vitals:  Vitals:   05/11/19 0945 05/11/19 1000  BP:  128/84  Pulse: 77 78  Resp: 10 11  Temp:    SpO2: 100% 96%    Last Pain:  Vitals:   05/11/19 1000  TempSrc:   PainSc: [place]

## 2019-05-11 NOTE — H&P (Signed)
CC- GWENDOLEN HEWLETT is a 53 y.o. female who presents with right knee pain.  HPI- . Knee Pain: Patient presents with knee pain involving the  right knee. Onset of the symptoms was several months ago. Inciting event: She has had a previous right knee arthroscopy and has had persistent and recurrent medial knee pain and mechanical symptoms. Current symptoms include giving out, pain located medially, stiffness and swelling. Pain is aggravated by lateral movements, pivoting, rising after sitting, squatting, standing and walking.  Patient has had prior knee problems. Evaluation to date: MRI: abnormal recurrent medial meniscal tear and early dgenerative changes. Treatment to date: rest.  Past Medical History:  Diagnosis Date  . ADD (attention deficit disorder)   . Anxiety   . Cervical radiculopathy 2014   L arm (into L4th and 5th fingers)  . Depression   . Endometriosis   . Hypertension   . Interstitial cystitis   . OA (osteoarthritis)   . PONV (postoperative nausea and vomiting)   . Right knee meniscal tear   . Wears glasses     Past Surgical History:  Procedure Laterality Date  . BLADDER SURGERY     due to interstitial cystitis  . BREAST REDUCTION SURGERY  1988  . CLOSED MANIPULATION SHOULDER Right 11/15/2009  . CYSTOSCOPY WITH HYDRODISTENSION AND BIOPSY  01-17-2004    dr Risa Grill  @[facility]    for IC  . DIAGNOSTIC LAPAROSCOPY  1988;  05-28-2006  . KNEE ARTHROSCOPY Bilateral right 1996;  left 11-02-2008  . KNEE ARTHROSCOPY WITH MEDIAL MENISECTOMY Right 08/13/2018   Procedure: RIGHT KNEE ARTHROSCOPY WITH DEBRIDEMENT chondroplasty;  Surgeon: Mcarthur Rossetti, MD;  Location: [facility];  Service: Orthopedics;  Laterality: Right;  . SHOULDER ARTHROSCOPY Bilateral right 08-07-2009;  let 04-17-2011  . TOTAL ABDOMINAL HYSTERECTOMY W/ BILATERAL SALPINGOOPHORECTOMY  05-19-2007   dr Bing Matter  @[facility]     Prior to Admission medications   Medication Sig Start Date End Date Taking? Authorizing Provider   acetaminophen (TYLENOL) 500 MG tablet Take 1,000 mg by mouth every 6 (six) hours as needed for mild pain or headache.   Yes [provider]  amphetamine-dextroamphetamine (ADDERALL XR) 15 MG 24 hr capsule Take 1 capsule by mouth every morning. Fill on 03/11/16 01/27/16  Yes Le, Thao P, DO  buPROPion (WELLBUTRIN XL) 300 MG 24 hr tablet Take 300 mg by mouth daily.   Yes [provider]  estrogen-methylTESTOSTERone (EST ESTROGENS-METHYLTEST HS) 0.625-1.25 MG tablet TAKE 1 TABLET BY MOUTH ONCE DAILY AT BEDTIME. 12/25/15  Yes Le, Thao P, DO  hydrochlorothiazide (HYDRODIURIL) 25 MG tablet TAKE 1/2 TABLET BY MOUTH BID Patient taking differently: Take 12.5 mg by mouth daily.  07/24/16  Yes Jeffery, Chelle, PA  losartan (COZAAR) 50 MG tablet Take 1 tablet (50 mg total) by mouth daily. 04/25/16  Yes English, Colletta Maryland D, PA  traZODone (DESYREL) 50 MG tablet Take 50 mg by mouth at bedtime.   Yes [provider]   Right knee exam antalgic gait, soft tissue tenderness over medial joint line,no  effusion, collateral ligaments intact  Physical Examination: General appearance - alert, well appearing, and in no distress Mental status - alert, oriented to person, place, and time Chest - clear to auscultation, no wheezes, rales or rhonchi, symmetric air entry Heart - normal rate, regular rhythm, normal S1, S2, no murmurs, rubs, clicks or gallops Abdomen - soft, nontender, nondistended, no masses or organomegaly Neurological - alert, oriented, normal speech, no focal findings or movement disorder noted   Asessment/Plan--- Rightt  knee medial meniscal tear- - Plan Right knee arthroscopy with meniscal debridement. Procedure risks and potential comps discussed with patient who elects to proceed. Goals are decreased pain and increased function with a high likelihood of achieving both

## 2019-05-11 NOTE — Discharge Instructions (Signed)
Dr. Gaynelle Arabian Total Joint Specialist [facility] [address], [place], [place] [address] [phone]   Arthroscopic Procedure, Knee An arthroscopic procedure can find what is wrong with your knee. PROCEDURE Arthroscopy is a surgical technique that allows your orthopedic surgeon to diagnose and treat your knee injury with accuracy. They will look into your knee through a small instrument. This is almost like a small (pencil sized) telescope. Because arthroscopy affects your knee less than open knee surgery, you can anticipate a more rapid recovery. Taking an active role by following your caregiver's instructions will help with rapid and complete recovery. Use crutches, rest, elevation, ice, and knee exercises as instructed. The length of recovery depends on various factors including type of injury, age, physical condition, medical conditions, and your rehabilitation. Your knee is the joint between the large bones (femur and tibia) in your leg. Cartilage covers these bone ends which are smooth and slippery and allow your knee to bend and move smoothly. Two menisci, thick, semi-lunar shaped pads of cartilage which form a rim inside the joint, help absorb shock and stabilize your knee. Ligaments bind the bones together and support your knee joint. Muscles move the joint, help support your knee, and take stress off the joint itself. Because of this all programs and physical therapy to rehabilitate an injured or repaired knee require rebuilding and strengthening your muscles. AFTER THE PROCEDURE  After the procedure, you will be moved to a recovery area until most of the effects of the medication have worn off. Your caregiver will discuss the test results with you.   Only take over-the-counter or prescription medicines for pain, discomfort, or fever as directed by your caregiver.  SEEK MEDICAL CARE IF:   You have increased bleeding from your wounds.   You see redness,  swelling, or have increasing pain in your wounds.   You have pus coming from your wound.   You have an oral temperature above 102 F (38.9 C).   You notice a bad smell coming from the wound or dressing.   You have severe pain with any motion of your knee.  SEEK IMMEDIATE MEDICAL CARE IF:   You develop a rash.   You have difficulty breathing.   You have any allergic problems.  FURTHER INSTRUCTIONS:   ICE to the affected knee every three hours for 30 minutes at a time and then as needed for pain and swelling.  Continue to use ice on the knee for pain and swelling from surgery. You may notice swelling that will progress down to the foot and ankle.  This is normal after surgery.  Elevate the leg when you are not up walking on it.    DIET You may resume your previous home diet once your are discharged from the hospital.  DRESSING / WOUND CARE / SHOWERING You may start showering two days after being discharged home but do not submerge the incisions under water.  Change dressing 48 hours after the procedure and then cover the small incisions with band aids until your follow up visit. Change the surgical dressings daily and reapply a dry dressing each time.   ACTIVITY Walk with your walker as instructed. Use walker as long as suggested by your caregivers. Avoid periods of inactivity such as sitting longer than an hour when not asleep. This helps prevent blood clots.  You may resume a sexual relationship in one month or when given the OK by your doctor.  You may return to  work once you are cleared by your doctor.  Do not drive a car for 6 weeks or until released by you surgeon.  Do not drive while taking narcotics.  WEIGHT BEARING Weight bearing as tolerated with assist device (walker, cane, etc) as directed, use it as long as suggested by your surgeon or therapist, typically at least 4-6 weeks.  POSTOPERATIVE CONSTIPATION PROTOCOL Constipation - defined medically as fewer than three  stools per week and severe constipation as less than one stool per week.  One of the most common issues patients have following surgery is constipation.  Even if you have a regular bowel pattern at home, your normal regimen is likely to be disrupted due to multiple reasons following surgery.  Combination of anesthesia, postoperative narcotics, change in appetite and fluid intake all can affect your bowels.  In order to avoid complications following surgery, here are some recommendations in order to help you during your recovery period.  Colace (docusate) - Pick up an over-the-counter form of Colace or another stool softener and take twice a day as long as you are requiring postoperative pain medications.  Take with a full glass of water daily.  If you experience loose stools or diarrhea, hold the colace until you stool forms back up.  If your symptoms do not get better within 1 week or if they get worse, check with your doctor.  Dulcolax (bisacodyl) - Pick up over-the-counter and take as directed by the product packaging as needed to assist with the movement of your bowels.  Take with a full glass of water.  Use this product as needed if not relieved by Colace only.   MiraLax (polyethylene glycol) - Pick up over-the-counter to have on hand.  MiraLax is a solution that will increase the amount of water in your bowels to assist with bowel movements.  Take as directed and can mix with a glass of water, juice, soda, coffee, or tea.  Take if you go more than two days without a movement. Do not use MiraLax more than once per day. Call your doctor if you are still constipated or irregular after using this medication for 7 days in a row.  If you continue to have problems with postoperative constipation, please contact the office for further assistance and recommendations.  If you experience "the worst abdominal pain ever" or develop nausea or vomiting, please contact the office immediatly for further  recommendations for treatment.  ITCHING  If you experience itching with your medications, try taking only a single pain pill, or even half a pain pill at a time.  You can also use Benadryl over the counter for itching or also to help with sleep.   TED HOSE STOCKINGS Wear the elastic stockings on both legs for three weeks following surgery during the day but you may remove then at night for sleeping.  MEDICATIONS See your medication summary on the After Visit Summary that the nursing staff will review with you prior to discharge.  You may have some home medications which will be placed on hold until you complete the course of blood thinner medication.  It is important for you to complete the blood thinner medication as prescribed by your surgeon.  Continue your approved medications as instructed at time of discharge. Do not drive while taking narcotics.   PRECAUTIONS If you experience chest pain or shortness of breath - call 911 immediately for transfer to the hospital emergency department.  If you develop a fever greater that 101  F, purulent drainage from wound, increased redness or drainage from wound, foul odor from the wound/dressing, or calf pain - CONTACT YOUR SURGEON.                                                   FOLLOW-UP APPOINTMENTS Make sure you keep all of your appointments after your operation with your surgeon and caregivers. You should call the office at [phone]  and make an appointment for approximately one week after the date of your surgery or on the date instructed by your surgeon outlined in the "After Visit Summary".  RANGE OF MOTION AND STRENGTHENING EXERCISES  Rehabilitation of the knee is important following a knee injury or an operation. After just a few days of immobilization, the muscles of the thigh which control the knee become weakened and shrink (atrophy). Knee exercises are designed to build up the tone and strength of the thigh muscles and to improve  knee motion. Often times heat used for twenty to thirty minutes before working out will loosen up your tissues and help with improving the range of motion but do not use heat for the first two weeks following surgery. These exercises can be done on a training (exercise) mat, on the floor, on a table or on a bed. Use what ever works the best and is most comfortable for you Knee exercises include:  QUAD STRENGTHENING EXERCISES Strengthening Quadriceps Sets  Tighten muscles on top of thigh by pushing knees down into floor or table. Hold for 20 seconds. Repeat 10 times. Do 2 sessions per day.     Strengthening Terminal Knee Extension  With knee bent over bolster, straighten knee by tightening muscle on top of thigh. Be sure to keep bottom of knee on bolster. Hold for 20 seconds. Repeat 10 times. Do 2 sessions per day.   Straight Leg with Bent Knee  Lie on back with opposite leg bent. Keep involved knee slightly bent at knee and raise leg 4-6". Hold for 10 seconds. Repeat 20 times per set. Do 2 sets per session. Do 2 sessions per day.    Post Anesthesia Home Care Instructions  Activity: Get plenty of rest for the remainder of the day. A responsible adult should stay with you for 24 hours following the procedure.  For the next 24 hours, DO NOT: -Drive a car -Paediatric nurse -Drink alcoholic beverages -Take any medication unless instructed by your physician -Make any legal decisions or sign important papers.  Meals: Start with liquid foods such as gelatin or soup. Progress to regular foods as tolerated. Avoid greasy, spicy, heavy foods. If nausea and/or vomiting occur, drink only clear liquids until the nausea and/or vomiting subsides. Call your physician if vomiting continues.  Special Instructions/Symptoms: Your throat may feel dry or sore from the anesthesia or the breathing tube placed in your throat during surgery. If this causes discomfort, gargle with warm salt water. The  discomfort should disappear within 24 hours.  If you had a scopolamine patch placed behind your ear for the management of post- operative nausea and/or vomiting:  1. The medication in the patch is effective for 72 hours, after which it should be removed.  Wrap patch in a tissue and discard in the trash. Wash hands thoroughly with soap and water. 2. You may remove the patch earlier than 72 hours  if you experience unpleasant side effects which may include dry mouth, dizziness or visual disturbances. 3. Avoid touching the patch. Wash your hands with soap and water after contact with the patch.

## 2019-05-11 NOTE — Op Note (Signed)
Operative Report- KNEE ARTHROSCOPY  Preoperative diagnosis-  Right knee medial meniscal tear  Postoperative diagnosis Right- knee medial meniscal tear plus  Chondral defect trochlea  Procedure- Right knee arthroscopy with medial  meniscal debridement and chondroplasty   Surgeon- Dione Plover. Shyleigh Daughtry, MD  Anesthesia-General  EBL-  Minimal  Complications- None  Condition- PACU - hemodynamically stable.  Brief clinical note- [name] [name] is a 53 yo female with a recurrent right knee medial meniscal tear. She presents for arthroscopy and meniscal debridement  Procedure in detail -       After successful administration of General anesthetic, a tourmiquet is placed high on the Right  thigh and the Right lower extremity is prepped and draped in the usual sterile fashion. Time out is performed by the surgical team. Standard superomedial and inferolateral portal sites are marked and incisions made with an 11 blade. The inflow cannula is passed through the superomedial portal and camera through the inferolateral portal and inflow is initiated. Arthroscopic visualization proceeds.      The undersurface of the patella and trochlea are visualized and the are Grade III changes in the central trochlea with minimal chondromalacia on the undersurface of the patella. The medial and lateral gutters are visualized and there are  no loose bodies. Flexion and valgus force is applied to the knee and the medial compartment is entered. A spinal needle is passed into the joint through the site marked for the inferomedial portal. A small incision is made and the dilator passed into the joint. The findings for the medial compartment are tear of posterior horn medial meniscus with Grade II changes focal area of medial femoral condyle. The tear is debrided to a stable base with baskets and a shaver and sealed off with the Arthrocare. It is probed and found to be stable. There are no unstable chondral defects.   The  intercondylar notch is visualized and the ACL appears normal . The lateral compartment is entered and the findings are normal .      The joint is again inspected and there are no other tears, defects or loose bodies identified. The arthroscopic equipment is then removed from the inferior portals which are closed with interrupted 4-0 nylon. 20 ml of .25% Marcaine with epinephrine are injected through the inflow cannula and the cannula is then removed and the portal closed with nylon. The incisions are cleaned and dried and a bulky sterile dressing is applied. The patient is then awakened and transported to recovery in stable condition.   05/11/2019, 9:10 AM

## 2019-05-11 NOTE — Anesthesia Preprocedure Evaluation (Signed)
Anesthesia Evaluation  Patient identified by MRN, date of birth, ID band Patient awake    Reviewed: Allergy & Precautions, NPO status , Patient's Chart, lab work & pertinent test results  History of Anesthesia Complications (+) PONV  Airway Mallampati: II  TM Distance: >3 FB Neck ROM: Full    Dental no notable dental hx.    Pulmonary neg pulmonary ROS, former smoker,    Pulmonary exam normal breath sounds clear to auscultation       Cardiovascular hypertension, negative cardio ROS Normal cardiovascular exam Rhythm:Regular Rate:Normal     Neuro/Psych Anxiety Depression negative neurological ROS  negative psych ROS   GI/Hepatic negative GI ROS, Neg liver ROS,   Endo/Other  negative endocrine ROS  Renal/GU negative Renal ROS     Musculoskeletal  (+) Arthritis ,   Abdominal   Peds  Hematology negative hematology ROS (+)   Anesthesia Other Findings   Reproductive/Obstetrics negative OB ROS                             Anesthesia Physical  Anesthesia Plan  ASA: II  Anesthesia Plan: General   Post-op Pain Management:    Induction: Intravenous  PONV Risk Score and Plan: 4 or greater and Ondansetron, Dexamethasone, Midazolam, Scopolamine patch - Pre-op and Treatment may vary due to age or medical condition  Airway Management Planned: LMA  Additional Equipment:   Intra-op Plan:   Post-operative Plan: Extubation in OR  Informed Consent: I have reviewed the patients History and Physical, chart, labs and discussed the procedure including the risks, benefits and alternatives for the proposed anesthesia with the patient or authorized representative who has indicated his/her understanding and acceptance.     Dental advisory given  Plan Discussed with: CRNA  Anesthesia Plan Comments:         Anesthesia Quick Evaluation

## 2019-05-11 NOTE — Anesthesia Procedure Notes (Signed)
Procedure Name: LMA Insertion Date/Time: 05/11/2019 8:28 AM Performed by: Justice Rocher, CRNA Pre-anesthesia Checklist: Patient identified, Emergency Drugs available, Suction available and Patient being monitored Patient Re-evaluated:Patient Re-evaluated prior to induction Oxygen Delivery Method: Circle system utilized Preoxygenation: Pre-oxygenation with 100% oxygen Induction Type: IV induction Ventilation: Mask ventilation without difficulty LMA: LMA inserted LMA Size: 4.0 Number of attempts: 1 Airway Equipment and Method: Bite block Placement Confirmation: positive ETCO2 and breath sounds checked- equal and bilateral Tube secured with: Tape Dental Injury: Teeth and Oropharynx as per pre-operative assessment

## 2019-05-11 NOTE — Transfer of Care (Signed)
Immediate Anesthesia Transfer of Care Note  Patient: [name] [name]  Procedure(s) Performed: Procedure(s) (LRB): ARTHROSCOPY KNEE, MEDIAL MENISCAL DEBRIDEMENT (Right) CHONDROPLASTY (Right)  Patient Location: PACU  Anesthesia Type: General  Level of Consciousness: awake, sedated, patient cooperative and responds to stimulation  Airway & Oxygen Therapy: Patient Spontanous Breathing and Patient connected to [place] O2 and soft mask  Post-op Assessment: Report given to PACU RN, Post -op Vital signs reviewed and stable and Patient moving all extremities  Post vital signs: Reviewed and stable  Complications: No apparent anesthesia complications

## 2019-05-12 ENCOUNTER — Encounter (HOSPITAL_BASED_OUTPATIENT_CLINIC_OR_DEPARTMENT_OTHER): Payer: Self-pay | Admitting: Orthopedic Surgery

## 2019-05-12 MED FILL — ADDERALL XR 15 MG CAP SA: 15 | 30 days supply | Qty: 30 | Fill #0

## 2019-05-12 MED FILL — LOSARTAN POTASSIUM 50 MG TA: 50 | 30 days supply | Qty: 30 | Fill #0

## 2019-05-12 MED FILL — buPROPion HCL ER (XL) 300 M: 300 | 30 days supply | Qty: 30 | Fill #0

## 2019-05-17 MED FILL — EEMT HS 0.625-1.25 MG TAB: 0.625-1.25 | 30 days supply | Qty: 30 | Fill #1

## 2019-06-01 MED FILL — traZODone HCL 50 MG TABS: 50 | 30 days supply | Qty: 30 | Fill #0

## 2019-06-09 MED FILL — LOSARTAN POTASSIUM 50 MG TA: 50 | 30 days supply | Qty: 30 | Fill #1

## 2019-06-09 MED FILL — buPROPion HCL ER (XL) 300 M: 300 | 30 days supply | Qty: 30 | Fill #1

## 2019-06-10 MED FILL — ADDERALL XR 15 MG CAP SA: 15 | 30 days supply | Qty: 30 | Fill #0

## 2019-06-14 MED FILL — EEMT HS 0.625-1.25 MG TAB: 0.625-1.25 | 30 days supply | Qty: 30 | Fill #0

## 2019-07-01 MED FILL — HYDROCHLOROTHIAZIDE 25 MG T: 25 | 30 days supply | Qty: 15 | Fill #0

## 2019-07-01 MED FILL — traZODone HCL 50 MG TABS: 50 | 30 days supply | Qty: 30 | Fill #1

## 2019-07-08 MED FILL — ADDERALL XR 15 MG CAP SA: 15 | 30 days supply | Qty: 30 | Fill #0

## 2019-07-11 MED FILL — LOSARTAN POTASSIUM 50 MG TA: 50 | 30 days supply | Qty: 30 | Fill #2

## 2019-07-11 MED FILL — buPROPion HCL ER (XL) 300 M: 300 | 30 days supply | Qty: 30 | Fill #2

## 2019-07-13 MED FILL — EEMT HS 0.625-1.25 MG TAB: 0.625-1.25 | 30 days supply | Qty: 30 | Fill #0

## 2019-07-26 ENCOUNTER — Ambulatory Visit
Admission: RE | Admit: 2019-07-26 | Discharge: 2019-07-26 | Disposition: A | Discharge status: Home / Self Care | Payer: No Typology Code available for payment source | Source: Ambulatory Visit | Attending: Physician Assistant | Admitting: Physician Assistant

## 2019-07-26 ENCOUNTER — Other Ambulatory Visit: Payer: Self-pay

## 2019-07-26 DIAGNOSIS — Z1231 Encounter for screening mammogram for malignant neoplasm of breast: Secondary | ICD-10-CM

## 2019-07-26 MED FILL — CLOBETASOL 0.05% SOLUTION: 0.05 | 50 days supply | Qty: 50 | Fill #0

## 2019-08-01 MED FILL — traZODone HCL 50 MG TABS: 50 | 30 days supply | Qty: 30 | Fill #0

## 2019-08-01 MED FILL — HYDROCHLOROTHIAZIDE 25 MG T: 25 | 30 days supply | Qty: 15 | Fill #0

## 2019-08-09 MED FILL — EST ESTROGENS-METHYLTEST HS: 0.625-1.25 | 30 days supply | Qty: 30 | Fill #0

## 2019-08-09 MED FILL — ADDERALL XR 15 MG CAP SA: 15 | 30 days supply | Qty: 30 | Fill #0

## 2019-08-10 MED FILL — LOSARTAN POTASSIUM 50 MG TA: 50 | 30 days supply | Qty: 30 | Fill #0

## 2019-08-10 MED FILL — buPROPion HCL ER (XL) 300 M: 300 | 30 days supply | Qty: 30 | Fill #0

## 2019-08-29 MED FILL — HYDROCHLOROTHIAZIDE 25 MG T: 25 | 30 days supply | Qty: 15 | Fill #1

## 2019-08-31 MED FILL — traZODone HCL 50 MG TABS: 50 | 30 days supply | Qty: 30 | Fill #0

## 2019-09-08 MED FILL — buPROPion HCL ER (XL) 300 M: 300 | 30 days supply | Qty: 30 | Fill #0

## 2019-09-08 MED FILL — EEMT HS 0.625-1.25 MG TAB: 0.625-1.25 | 30 days supply | Qty: 30 | Fill #0

## 2019-09-08 MED FILL — LOSARTAN POTASSIUM 50 MG TA: 50 | 30 days supply | Qty: 30 | Fill #0

## 2019-09-13 MED FILL — ADDERALL XR 15 MG CAP SA: 15 | 30 days supply | Qty: 30 | Fill #0

## 2019-09-21 ENCOUNTER — Encounter: Payer: Self-pay | Admitting: *Deleted

## 2019-09-29 MED FILL — HYDROCHLOROTHIAZIDE 25 MG T: 25 | 30 days supply | Qty: 15 | Fill #0

## 2019-09-29 MED FILL — traZODone HCL 50 MG TABS: 50 | 30 days supply | Qty: 30 | Fill #0

## 2019-10-07 MED FILL — EEMT HS 0.625-1.25 MG TAB: 0.625-1.25 | 30 days supply | Qty: 30 | Fill #0

## 2019-10-07 MED FILL — LOSARTAN POTASSIUM 50 MG TA: 50 | 30 days supply | Qty: 30 | Fill #1

## 2019-10-07 MED FILL — buPROPion HCL ER (XL) 300 M: 300 | 30 days supply | Qty: 30 | Fill #1

## 2019-10-12 MED FILL — ADDERALL XR 15 MG CAP SA: 15 | 30 days supply | Qty: 30 | Fill #0

## 2019-10-27 MED FILL — traZODone HCL 50 MG TABS: 50 | 30 days supply | Qty: 30 | Fill #1

## 2019-11-03 MED FILL — HYDROCHLOROTHIAZIDE 25 MG T: 25 | 30 days supply | Qty: 15 | Fill #0

## 2019-11-09 MED FILL — LOSARTAN POTASSIUM 50 MG TA: 50 | 90 days supply | Qty: 90 | Fill #0

## 2019-11-09 MED FILL — ADDERALL XR 15 MG CAP SA: 15 | 30 days supply | Qty: 30 | Fill #0

## 2019-11-09 MED FILL — EST ESTROGENS-METHYLTEST HS: 0.625-1.25 | 30 days supply | Qty: 30 | Fill #0

## 2019-11-09 MED FILL — BUPROPION HCL ER (XL) 300 M: 300 | 30 days supply | Qty: 30 | Fill #2

## 2019-11-29 MED FILL — traZODone HCL 50 MG TABS: 50 | 30 days supply | Qty: 30 | Fill #0

## 2019-12-05 MED FILL — HYDROCHLOROTHIAZIDE 25 MG T: 25 | 90 days supply | Qty: 45 | Fill #0

## 2019-12-06 MED FILL — BUPROPION HCL XL 300 MG TAB: 300 | 30 days supply | Qty: 30 | Fill #0

## 2019-12-06 MED FILL — ESTROGEN-METHYLTESTOS H.S.: 0.625-1.25 | 30 days supply | Qty: 30 | Fill #1

## 2019-12-07 MED FILL — ADDERALL XR 15 MG CAP SA: 15 | 30 days supply | Qty: 30 | Fill #0

## 2019-12-22 MED FILL — ONDANSETRON ODT 8 MG TABLET: 8 | 3 days supply | Qty: 10 | Fill #0

## 2019-12-28 MED FILL — traZODone HCL 50 MG TABS: 50 | 30 days supply | Qty: 30 | Fill #1

## 2020-01-13 MED FILL — EEMT HS 0.625-1.25 MG TAB: 0.625-1.25 | 30 days supply | Qty: 30 | Fill #0

## 2020-01-16 MED FILL — BUPROPION HCL XL 300 MG TAB: 300 | 30 days supply | Qty: 30 | Fill #1

## 2020-01-20 MED FILL — ADDERALL XR 15 MG CAP SA: 15 | 30 days supply | Qty: 30 | Fill #0

## 2020-01-31 MED FILL — traZODone HCL 50 MG TABS: 50 | 30 days supply | Qty: 30 | Fill #2

## 2020-02-10 MED FILL — EEMT HS 0.625-1.25 MG TAB: 0.625-1.25 | 30 days supply | Qty: 30 | Fill #1

## 2020-02-13 MED FILL — LOSARTAN POTASSIUM 50 MG TA: 50 | 90 days supply | Qty: 90 | Fill #0

## 2020-02-13 MED FILL — BuPROPion HCL ER (XL) 300 M: 300 | 30 days supply | Qty: 30 | Fill #0

## 2020-02-16 MED FILL — ADDERALL XR 15 MG CAP SA: 15 | 30 days supply | Qty: 30 | Fill #0

## 2020-02-27 MED FILL — traZODone HCL 50 MG TABS: 50 | 30 days supply | Qty: 30 | Fill #3

## 2020-03-12 MED FILL — HYDROCHLOROTHIAZIDE 25 MG T: 25 | 60 days supply | Qty: 30 | Fill #1

## 2020-03-12 MED FILL — ESTROGEN-METHYLTESTOS H.S.: 0.625-1.25 | 30 days supply | Qty: 30 | Fill #0

## 2020-03-15 MED FILL — buPROPion HCL ER (XL) 300 M: 300 | 30 days supply | Qty: 30 | Fill #1

## 2020-03-19 MED FILL — ADDERALL XR 15 MG CAP SA: 15 | 30 days supply | Qty: 30 | Fill #0

## 2020-04-12 MED FILL — ESTROGEN-METHYLTESTOS H.S.: 0.625-1.25 | 30 days supply | Qty: 30 | Fill #0

## 2020-04-16 MED FILL — buPROPion HCL ER (XL) 300 M: 300 | 30 days supply | Qty: 30 | Fill #2

## 2020-04-18 MED FILL — ADDERALL XR 15 MG CAP SA: 15 | 30 days supply | Qty: 30 | Fill #0

## 2020-05-11 MED FILL — HYDROCHLOROTHIAZIDE 25 MG T: 25 | 30 days supply | Qty: 15 | Fill #0

## 2020-05-12 MED FILL — buPROPion HCL ER (XL) 300 M: 300 | 30 days supply | Qty: 30 | Fill #3

## 2020-05-12 MED FILL — LOSARTAN POTASSIUM 50 MG TA: 50 | 90 days supply | Qty: 90 | Fill #1

## 2020-05-15 MED FILL — ESTROGEN-METHYLTESTOS H.S.: 0.625-1.25 | 30 days supply | Qty: 30 | Fill #1

## 2020-05-18 MED FILL — ADDERALL XR 15 MG CAP SA: 15 | 30 days supply | Qty: 30 | Fill #0

## 2020-05-28 MED FILL — traZODone HCL 50 MG TABS: 50 | 30 days supply | Qty: 30 | Fill #5

## 2020-06-01 ENCOUNTER — Other Ambulatory Visit (HOSPITAL_COMMUNITY): Payer: Self-pay | Admitting: Physician Assistant

## 2020-06-01 MED FILL — hydrOXYzine HCL 25 MG TABS: 25 | 22 days supply | Qty: 45 | Fill #0

## 2020-06-04 MED FILL — HYDROCHLOROTHIAZIDE 25 MG T: 25 | 90 days supply | Qty: 45 | Fill #0

## 2020-06-05 MED FILL — buPROPion HCL ER (XL) 300 M: 300 | 30 days supply | Qty: 30 | Fill #0

## 2020-06-06 MED FILL — ESTRADIOL 10 MCG TABS: 10 | 14 days supply | Qty: 14 | Fill #0

## 2020-06-19 MED FILL — ADDERALL XR 15 MG CAP SA: 15 | 30 days supply | Qty: 30 | Fill #0

## 2020-06-22 ENCOUNTER — Other Ambulatory Visit: Payer: Self-pay | Admitting: Physician Assistant

## 2020-06-22 DIAGNOSIS — Z1231 Encounter for screening mammogram for malignant neoplasm of breast: Secondary | ICD-10-CM

## 2020-07-17 MED FILL — HYDROXYZINE HCL 25 MG TABS: 25 | 22 days supply | Qty: 45 | Fill #1

## 2020-07-17 MED FILL — ADDERALL XR 15 MG CAP SA: 15 | 30 days supply | Qty: 30 | Fill #0

## 2020-07-17 MED FILL — buPROPion HCL ER (XL) 300 M: 300 | 30 days supply | Qty: 30 | Fill #1

## 2020-08-07 ENCOUNTER — Other Ambulatory Visit: Payer: Self-pay

## 2020-08-07 ENCOUNTER — Ambulatory Visit
Admission: RE | Admit: 2020-08-07 | Discharge: 2020-08-07 | Disposition: A | Discharge status: Home / Self Care | Payer: No Typology Code available for payment source | Source: Ambulatory Visit | Attending: Physician Assistant | Admitting: Physician Assistant

## 2020-08-07 DIAGNOSIS — Z1231 Encounter for screening mammogram for malignant neoplasm of breast: Secondary | ICD-10-CM

## 2020-08-13 MED FILL — buPROPion HCL ER (XL) 300 M: 300 | 30 days supply | Qty: 30 | Fill #2

## 2020-08-13 MED FILL — LOSARTAN POTASSIUM 50 MG TA: 50 | 90 days supply | Qty: 90 | Fill #0

## 2020-08-14 ENCOUNTER — Other Ambulatory Visit: Payer: Self-pay | Admitting: Physician Assistant

## 2020-08-14 DIAGNOSIS — R945 Abnormal results of liver function studies: Secondary | ICD-10-CM

## 2020-08-15 ENCOUNTER — Other Ambulatory Visit (HOSPITAL_COMMUNITY): Payer: Self-pay | Admitting: Physician Assistant

## 2020-08-15 MED FILL — traZODone HCL 50 MG TABS: 50 | 30 days supply | Qty: 30 | Fill #0

## 2020-08-17 MED FILL — ADDERALL XR 15 MG CAP SA: 15 | 30 days supply | Qty: 30 | Fill #0

## 2020-08-31 MED FILL — ESTROGEN-METHYLTESTOS H.S.: 0.625-1.25 | 30 days supply | Qty: 30 | Fill #0

## 2020-09-03 ENCOUNTER — Other Ambulatory Visit: Payer: No Typology Code available for payment source

## 2020-09-04 ENCOUNTER — Ambulatory Visit
Admission: RE | Admit: 2020-09-04 | Discharge: 2020-09-04 | Disposition: A | Discharge status: Home / Self Care | Payer: No Typology Code available for payment source | Source: Ambulatory Visit | Attending: Physician Assistant | Admitting: Physician Assistant

## 2020-09-04 DIAGNOSIS — R945 Abnormal results of liver function studies: Secondary | ICD-10-CM

## 2020-09-04 MED FILL — HYDROCHLOROTHIAZIDE 25 MG T: 25 | 90 days supply | Qty: 45 | Fill #0

## 2020-09-11 ENCOUNTER — Ambulatory Visit (INDEPENDENT_AMBULATORY_CARE_PROVIDER_SITE_OTHER): Payer: No Typology Code available for payment source | Admitting: Physician Assistant

## 2020-09-11 ENCOUNTER — Other Ambulatory Visit: Payer: Self-pay

## 2020-09-11 ENCOUNTER — Encounter: Payer: Self-pay | Admitting: Physician Assistant

## 2020-09-11 DIAGNOSIS — L570 Actinic keratosis: Secondary | ICD-10-CM

## 2020-09-11 DIAGNOSIS — L578 Other skin changes due to chronic exposure to nonionizing radiation: Secondary | ICD-10-CM | POA: Diagnosis not present

## 2020-09-11 DIAGNOSIS — Z1283 Encounter for screening for malignant neoplasm of skin: Secondary | ICD-10-CM | POA: Diagnosis not present

## 2020-09-12 MED FILL — buPROPion HCL ER (XL) 300 M: 300 | 30 days supply | Qty: 30 | Fill #3

## 2020-09-12 NOTE — Progress Notes (Signed)
   Follow-Up Visit   Subjective  [name] [name] is a 54 y.o. female who presents for the following: Annual Exam (RIGHT UPPER CHEST DARK SPOT).   The following portions of the chart were reviewed this encounter and updated as appropriate: Tobacco  Allergies  Meds  Problems  Med Hx  Surg Hx  Fam Hx      Objective  Well appearing patient in no apparent distress; mood and affect are within normal limits.  A full examination was performed including scalp, head, eyes, ears, nose, lips, neck, chest, axillae, abdomen, back, buttocks, bilateral upper extremities, bilateral lower extremities, hands, feet, fingers, toes, fingernails, and toenails. All findings within normal limits unless otherwise noted below.  Objective  Left Zygomatic Area, Right Nasal Sidewall: Erythematous patches with gritty scale.  Objective  face: Diffuse lentigines.  Objective  Head to toe: No atypical nevi No signs of non-mole skin cancer.    Assessment & Plan  AK (actinic keratosis) (2) Right Nasal Sidewall; Left Zygomatic Area  Destruction of lesion - Left Zygomatic Area, Right Nasal Sidewall Complexity: simple   Destruction method: cryotherapy   Informed consent: discussed and consent obtained   Timeout:  patient name, date of birth, surgical site, and procedure verified Lesion destroyed using liquid nitrogen: Yes   Cryotherapy cycles:  1 Outcome: patient tolerated procedure well with no complications   Post-procedure details: wound care instructions given    Actinic skin damage face  observe  Screening exam for skin cancer Head to toe  Yearly skin exam    I, Saniya Tranchina, PA-C, have reviewed all documentation's for this visit.  The documentation on 09/12/20 for the exam, diagnosis, procedures and orders are all accurate and complete.

## 2020-09-13 MED FILL — traZODone HCL 50 MG TABS: 50 | 30 days supply | Qty: 30 | Fill #1

## 2020-09-19 ENCOUNTER — Other Ambulatory Visit (HOSPITAL_COMMUNITY): Payer: Self-pay | Admitting: Physician Assistant

## 2020-09-19 MED FILL — ADDERALL XR 15 MG CAP SA: 15 | 30 days supply | Qty: 30 | Fill #0

## 2020-09-27 ENCOUNTER — Other Ambulatory Visit (HOSPITAL_COMMUNITY): Payer: Self-pay

## 2020-09-27 MED FILL — OMRON 3 SERIES BP MONITOR D: 1 days supply | Qty: 1 | Fill #0

## 2020-10-12 MED FILL — buPROPion HCL ER (XL) 300 M: 300 | 30 days supply | Qty: 30 | Fill #4

## 2020-10-12 MED FILL — traZODone HCL 50 MG TABS: 50 | 30 days supply | Qty: 30 | Fill #2

## 2020-10-22 MED FILL — ADDERALL XR 15 MG CAP SA: 15 | 30 days supply | Qty: 30 | Fill #0

## 2020-10-29 MED FILL — ESTROGEN-METHYLTESTOS H.S.: 0.625-1.25 | 30 days supply | Qty: 30 | Fill #1

## 2020-11-11 MED FILL — buPROPion HCL ER (XL) 300 M: 300 | 30 days supply | Qty: 30 | Fill #5

## 2020-11-11 MED FILL — LOSARTAN POTASSIUM 50 MG TA: 50 | 90 days supply | Qty: 90 | Fill #1

## 2020-11-11 MED FILL — traZODone HCL 50 MG TABS: 50 | 30 days supply | Qty: 30 | Fill #3

## 2020-11-20 MED FILL — ADDERALL XR 15 MG CAP SA: 15 | 30 days supply | Qty: 30 | Fill #0

## 2020-12-04 MED FILL — HYDROCHLOROTHIAZIDE 25 MG T: 25 | 30 days supply | Qty: 15 | Fill #1

## 2020-12-10 ENCOUNTER — Other Ambulatory Visit (HOSPITAL_COMMUNITY): Payer: Self-pay | Admitting: Physician Assistant

## 2020-12-10 MED FILL — traZODone HCL 50 MG TABS: 50 | 30 days supply | Qty: 30 | Fill #4

## 2020-12-11 MED FILL — buPROPion HCL ER (XL) 300 M: 300 | 30 days supply | Qty: 30 | Fill #0

## 2020-12-26 ENCOUNTER — Other Ambulatory Visit (HOSPITAL_COMMUNITY): Payer: Self-pay | Admitting: Physician Assistant

## 2020-12-26 MED FILL — ADDERALL XR 15 MG CAP SA: 15 | 30 days supply | Qty: 30 | Fill #0

## 2020-12-29 MED FILL — ESTROGEN-METHYLTESTOS H.S.: 0.625-1.25 | 30 days supply | Qty: 30 | Fill #2

## 2021-01-01 MED FILL — HYDROCHLOROTHIAZIDE 25 MG T: 25 | 30 days supply | Qty: 15 | Fill #0

## 2021-01-11 MED FILL — buPROPion HCL ER (XL) 300 M: 300 | 30 days supply | Qty: 30 | Fill #1

## 2021-01-11 MED FILL — traZODone HCL 50 MG TABS: 50 | 30 days supply | Qty: 30 | Fill #5

## 2021-01-16 ENCOUNTER — Other Ambulatory Visit (HOSPITAL_COMMUNITY): Payer: Self-pay | Admitting: Physician Assistant

## 2021-01-23 MED FILL — LOSARTAN POTASSIUM 50 MG TA: 50 | 30 days supply | Qty: 30 | Fill #0

## 2021-01-25 MED FILL — HYDROCHLOROTHIAZIDE 25 MG T: 25 | 90 days supply | Qty: 45 | Fill #0

## 2021-01-28 MED FILL — ADDERALL XR 15 MG CAP SA: 15 | 30 days supply | Qty: 30 | Fill #0

## 2021-02-08 ENCOUNTER — Other Ambulatory Visit (HOSPITAL_COMMUNITY): Payer: Self-pay | Admitting: Physician Assistant

## 2021-02-08 MED FILL — buPROPion HCL ER (XL) 300 M: 300 | 30 days supply | Qty: 30 | Fill #0

## 2021-02-14 MED FILL — traZODone HCL 50 MG TABS: 50 | 90 days supply | Qty: 90 | Fill #0

## 2021-03-08 ENCOUNTER — Other Ambulatory Visit (HOSPITAL_BASED_OUTPATIENT_CLINIC_OR_DEPARTMENT_OTHER): Payer: Self-pay

## 2021-03-08 MED FILL — LOSARTAN POTASSIUM 50 MG TA: 50 | 30 days supply | Qty: 30 | Fill #1

## 2021-03-14 MED FILL — buPROPion HCL ER (XL) 300 M: 300 | 30 days supply | Qty: 30 | Fill #1

## 2021-03-29 ENCOUNTER — Other Ambulatory Visit (HOSPITAL_COMMUNITY): Payer: Self-pay

## 2021-03-29 MED FILL — Amphetamine-Dextroamphetamine Cap ER 24HR 15 MG: ORAL | 30 days supply | Qty: 30 | Fill #0 | Status: AC

## 2021-04-02 ENCOUNTER — Encounter (HOSPITAL_COMMUNITY): Payer: Self-pay

## 2021-04-02 ENCOUNTER — Other Ambulatory Visit: Payer: Self-pay

## 2021-04-02 ENCOUNTER — Ambulatory Visit (HOSPITAL_COMMUNITY)
Admission: EM | Admit: 2021-04-02 | Discharge: 2021-04-02 | Disposition: A | Discharge status: Home / Self Care | Payer: No Typology Code available for payment source | Attending: Family Medicine | Admitting: Family Medicine

## 2021-04-02 DIAGNOSIS — B029 Zoster without complications: Secondary | ICD-10-CM | POA: Diagnosis not present

## 2021-04-02 MED ORDER — VALACYCLOVIR HCL 1 G PO TABS: 1000.0000 mg | ORAL_TABLET | Freq: Three times a day (TID) | ORAL | 0 refills | Status: DC

## 2021-04-02 NOTE — ED Triage Notes (Signed)
Pt present rash on her chest that is tingling and burning. Symptom started two days ago.

## 2021-04-03 ENCOUNTER — Other Ambulatory Visit (HOSPITAL_COMMUNITY): Payer: Self-pay

## 2021-04-03 NOTE — ED Provider Notes (Signed)
[place]   [id number] 04/02/21 Arrival Time: [place]:  1. Herpes zoster without complication    Begin: Meds ordered this encounter  Medications  . valACYclovir (VALTREX) 1000 MG tablet    Sig: Take 1 tablet (1,000 mg total) by mouth 3 (three) times daily.    Dispense:  21 tablet    Refill:  0   No sign of bacterial skin infection. Will follow up with PCP or here if worsening or failing to improve as anticipated. Reviewed expectations re: course of current medical issues. Questions answered. Outlined signs and symptoms indicating need for more acute intervention. Patient verbalized understanding. After Visit Summary given.   SUBJECTIVE:  [name] [name] is a 55 y.o. female who presents with a skin complaint. Over R upper breast; quest on back. Tingling burning then rash two d ago. Mild pain. Afebrile. No recent illnesses. No h/o similar.    OBJECTIVE: Vitals:   04/02/21 1938  BP: 138/88  Pulse: 86  Resp: 16  Temp: 98.6 F (37 C)  TempSrc: Temporal  SpO2: 98%    General appearance: alert; no distress HEENT: [place]; AT Neck: supple with FROM Extremities: no edema; moves all extremities normally Skin: warm and dry; crops of red/purplish papules over R upper/inner breast with small areas of same on upper R back Psychological: alert and cooperative; normal mood and affect  No Known Allergies  Past Medical History:  Diagnosis Date  . ADD (attention deficit disorder)   . Anxiety   . Cervical radiculopathy 2014   L arm (into L4th and 5th fingers)  . Depression   . Endometriosis   . Hypertension   . Interstitial cystitis   . OA (osteoarthritis)   . PONV (postoperative nausea and vomiting)   . Right knee meniscal tear   . Wears glasses    Social History   Socioeconomic History  . Marital status: Married    Spouse name: Not on file  . Number of children: 2  . Years of education: Not on file  . Highest education level: Not on file   Occupational History  . Not on file  Tobacco Use  . Smoking status: Former Smoker    Years: 4.00    Types: Cigarettes    Quit date: 08/04/1990    Years since quitting: 30.6  . Smokeless tobacco: Never Used  Vaping Use  . Vaping Use: Never used  Substance and Sexual Activity  . Alcohol use: Yes    Comment: rare  . Drug use: No  . Sexual activity: Yes    Birth control/protection: Surgical  Other Topics Concern  . Not on file  Social History Narrative  . Not on file   Social Determinants of Health   Financial Resource Strain: Not on file  Food Insecurity: Not on file  Transportation Needs: Not on file  Physical Activity: Not on file  Stress: Not on file  Social Connections: Not on file  Intimate Partner Violence: Not on file   Family History  Problem Relation Age of Onset  . Stroke Brother   . Diabetes Brother   . Hyperlipidemia Brother   . Heart disease Father   . Stroke Father   . Mental illness Father   . Mental illness Sister   . Colon cancer Maternal Grandmother   . Colon cancer Maternal Uncle   . Breast cancer Neg Hx    Past Surgical History:  Procedure Laterality Date  . BLADDER SURGERY  due to interstitial cystitis  . BREAST REDUCTION SURGERY  1988  . CHONDROPLASTY Right 05/11/2019   Procedure: CHONDROPLASTY;  Surgeon: Gaynelle Arabian, MD;  Location: [facility];  Service: Orthopedics;  Laterality: Right;  . CLOSED MANIPULATION SHOULDER Right 11/15/2009  . CYSTOSCOPY WITH HYDRODISTENSION AND BIOPSY  01-17-2004    dr Risa Grill  @[facility]    for IC  . DIAGNOSTIC LAPAROSCOPY  1988;  05-28-2006  . KNEE ARTHROSCOPY Bilateral right 1996;  left 11-02-2008  . KNEE ARTHROSCOPY Right 05/11/2019   Procedure: ARTHROSCOPY KNEE, MEDIAL MENISCAL DEBRIDEMENT;  Surgeon: Gaynelle Arabian, MD;  Location: [place];  Service: Orthopedics;  Laterality: Right;  . KNEE ARTHROSCOPY WITH MEDIAL MENISECTOMY Right 08/13/2018   Procedure: RIGHT KNEE  ARTHROSCOPY WITH DEBRIDEMENT chondroplasty;  Surgeon: Mcarthur Rossetti, MD;  Location: [facility];  Service: Orthopedics;  Laterality: Right;  . REDUCTION MAMMAPLASTY    . SHOULDER ARTHROSCOPY Bilateral right 08-07-2009;  let 04-17-2011  . TOTAL ABDOMINAL HYSTERECTOMY W/ BILATERAL SALPINGOOPHORECTOMY  05-19-2007   dr Bing Matter  @[facility]      Vanessa Kick, MD 04/03/21 [phone]

## 2021-04-08 ENCOUNTER — Encounter (HOSPITAL_COMMUNITY): Payer: Self-pay

## 2021-04-08 ENCOUNTER — Ambulatory Visit (HOSPITAL_COMMUNITY)
Admission: EM | Admit: 2021-04-08 | Discharge: 2021-04-08 | Disposition: A | Discharge status: Home / Self Care | Payer: No Typology Code available for payment source

## 2021-04-08 DIAGNOSIS — S61412A Laceration without foreign body of left hand, initial encounter: Secondary | ICD-10-CM

## 2021-04-08 DIAGNOSIS — M79642 Pain in left hand: Secondary | ICD-10-CM | POA: Diagnosis not present

## 2021-04-08 NOTE — ED Provider Notes (Signed)
[place]   MRN: [id number] DOB: June 07, 1966  Subjective:   [name] [name] is a 55 y.o. female presenting for suffering a left hand laceration yesterday while using a knife.  Wound was accidental, was sustained 18 hours ago.  Has kept the wound clean and covered.  Cannot recall her last Tdap but does not want it updated today.  Wants to check back with her PCP.  No current facility-administered medications for this encounter.  Current Outpatient Medications:  .  acetaminophen (TYLENOL) 500 MG tablet, Take 1,000 mg by mouth every 6 (six) hours as needed for mild pain or headache., Disp: , Rfl:  .  amphetamine-dextroamphetamine (ADDERALL XR) 15 MG 24 hr capsule, Take 1 capsule by mouth every morning. Fill on 03/11/16, Disp: 30 capsule, Rfl: 0 .  amphetamine-dextroamphetamine (ADDERALL XR) 15 MG 24 hr capsule, TAKE 1 CAPSULE BY MOUTH EVERY MORNING (FILL 03/26/21), Disp: 30 capsule, Rfl: 0 .  amphetamine-dextroamphetamine (ADDERALL XR) 15 MG 24 hr capsule, TAKE 1 CAPSULE BY MOUTH EVERY MORNING (FILL 02/24/21), Disp: 30 capsule, Rfl: 0 .  amphetamine-dextroamphetamine (ADDERALL XR) 15 MG 24 hr capsule, TAKE 1 CAPSULE BY MOUTH EVERY MORNING (FILL 01/25/21), Disp: 30 capsule, Rfl: 0 .  amphetamine-dextroamphetamine (ADDERALL XR) 15 MG 24 hr capsule, TAKE 1 CAPSULE BY MOUTH EVERY MORNING., Disp: 30 capsule, Rfl: 0 .  amphetamine-dextroamphetamine (ADDERALL XR) 15 MG 24 hr capsule, TAKE 1 CAPSULE BY MOUTH EVERY MORNING (FILL 11/18/20), Disp: 30 capsule, Rfl: 0 .  amphetamine-dextroamphetamine (ADDERALL XR) 15 MG 24 hr capsule, TAKE 1 CAPSULE BY MOUTH EVERY MORNING (FILL 10/19/20), Disp: 30 capsule, Rfl: 0 .  amphetamine-dextroamphetamine (ADDERALL XR) 15 MG 24 hr capsule, TAKE 1 CAPSULE BY MOUTH ONCE A DAY, Disp: 30 capsule, Rfl: 0 .  Blood Pressure Monitoring (OMRON 3 SERIES BP MONITOR) DEVI, USE AS DIRECTED, Disp: 1 each, Rfl: 0 .  buPROPion (WELLBUTRIN XL) 300 MG 24 hr tablet, Take 300  mg by mouth daily., Disp: , Rfl:  .  buPROPion (WELLBUTRIN XL) 300 MG 24 hr tablet, TAKE 1 TABLET BY MOUTH DAILY, Disp: 30 tablet, Rfl: 2 .  buPROPion (WELLBUTRIN XL) 300 MG 24 hr tablet, TAKE 1 TABLET BY MOUTH EVERY MORNING, Disp: 30 tablet, Rfl: 1 .  buPROPion (WELLBUTRIN XL) 300 MG 24 hr tablet, TAKE 1 TABLET BY MOUTH EVERY MORNING, Disp: 30 tablet, Rfl: 5 .  estrogen-methylTESTOSTERone (EST ESTROGENS-METHYLTEST HS) 0.625-1.25 MG tablet, TAKE 1 TABLET BY MOUTH ONCE DAILY AT BEDTIME., Disp: 30 tablet, Rfl: 5 .  estrogen-methylTESTOSTERone 0.625-1.25 MG tablet, TAKE 1 TABLET BY MOUTH ONCE DAILY WITH FOOD, Disp: 30 tablet, Rfl: 5 .  hydrochlorothiazide (HYDRODIURIL) 25 MG tablet, TAKE 1/2 TABLET BY MOUTH BID (Patient taking differently: Take 12.5 mg by mouth daily. ), Disp: 30 tablet, Rfl: 0 .  hydrochlorothiazide (HYDRODIURIL) 25 MG tablet, TAKE 1/2 TABLET BY MOUTH ONCE A DAY. TAKE WITH LOSARTAN, Disp: 45 tablet, Rfl: 1 .  HYDROcodone-acetaminophen (NORCO) 5-325 MG tablet, Take 1 tablet by mouth every 6 (six) hours as needed for moderate pain., Disp: 30 tablet, Rfl: 0 .  losartan (COZAAR) 50 MG tablet, Take 1 tablet (50 mg total) by mouth daily., Disp: 90 tablet, Rfl: 0 .  losartan (COZAAR) 50 MG tablet, TAKE 1 TABLET BY MOUTH ONCE A DAY WITH HYDROCHLOROTHIAZIDE, Disp: 90 tablet, Rfl: 1 .  methocarbamol (ROBAXIN) 500 MG tablet, Take 1 tablet (500 mg total) by mouth 3 (three) times daily as needed for muscle spasms. As needed for muscle  spasm (Patient not taking: Reported on 09/11/2020), Disp: 30 tablet, Rfl: 1 .  traZODone (DESYREL) 50 MG tablet, Take 50 mg by mouth at bedtime., Disp: , Rfl:  .  traZODone (DESYREL) 50 MG tablet, TAKE 1 TABLET BY MOUTH AT BEDTIME, Disp: 90 tablet, Rfl: 1 .  traZODone (DESYREL) 50 MG tablet, TAKE 1/2 TO 1 TABLET BY MOUTH ONCE DAILY AT BEDTIME, Disp: 30 tablet, Rfl: 5 .  valACYclovir (VALTREX) 1000 MG tablet, Take 1 tablet (1,000 mg total) by mouth 3 (three) times  daily., Disp: 21 tablet, Rfl: 0   No Known Allergies  Past Medical History:  Diagnosis Date  . ADD (attention deficit disorder)   . Anxiety   . Cervical radiculopathy 2014   L arm (into L4th and 5th fingers)  . Depression   . Endometriosis   . Hypertension   . Interstitial cystitis   . OA (osteoarthritis)   . PONV (postoperative nausea and vomiting)   . Right knee meniscal tear   . Wears glasses      Past Surgical History:  Procedure Laterality Date  . BLADDER SURGERY     due to interstitial cystitis  . BREAST REDUCTION SURGERY  1988  . CHONDROPLASTY Right 05/11/2019   Procedure: CHONDROPLASTY;  Surgeon: Gaynelle Arabian, MD;  Location: [facility];  Service: Orthopedics;  Laterality: Right;  . CLOSED MANIPULATION SHOULDER Right 11/15/2009  . CYSTOSCOPY WITH HYDRODISTENSION AND BIOPSY  01-17-2004    dr Risa Grill  @[facility]    for IC  . DIAGNOSTIC LAPAROSCOPY  1988;  05-28-2006  . KNEE ARTHROSCOPY Bilateral right 1996;  left 11-02-2008  . KNEE ARTHROSCOPY Right 05/11/2019   Procedure: ARTHROSCOPY KNEE, MEDIAL MENISCAL DEBRIDEMENT;  Surgeon: Gaynelle Arabian, MD;  Location: [place];  Service: Orthopedics;  Laterality: Right;  . KNEE ARTHROSCOPY WITH MEDIAL MENISECTOMY Right 08/13/2018   Procedure: RIGHT KNEE ARTHROSCOPY WITH DEBRIDEMENT chondroplasty;  Surgeon: Mcarthur Rossetti, MD;  Location: [facility];  Service: Orthopedics;  Laterality: Right;  . REDUCTION MAMMAPLASTY    . SHOULDER ARTHROSCOPY Bilateral right 08-07-2009;  let 04-17-2011  . TOTAL ABDOMINAL HYSTERECTOMY W/ BILATERAL SALPINGOOPHORECTOMY  05-19-2007   dr Bing Matter  @[facility]     Family History  Problem Relation Age of Onset  . Stroke Brother   . Diabetes Brother   . Hyperlipidemia Brother   . Heart disease Father   . Stroke Father   . Mental illness Father   . Mental illness Sister   . Colon cancer Maternal Grandmother   . Colon cancer Maternal Uncle   . Breast cancer Neg Hx      Social History   Tobacco Use  . Smoking status: Former Smoker    Years: 4.00    Types: Cigarettes    Quit date: 08/04/1990    Years since quitting: 30.6  . Smokeless tobacco: Never Used  Vaping Use  . Vaping Use: Never used  Substance Use Topics  . Alcohol use: Yes    Comment: rare  . Drug use: No    ROS   Objective:   Vitals: BP 126/85   Pulse 86   Temp 99 F (37.2 C)   Resp 17   SpO2 98%   Physical Exam Constitutional:      General: She is not in acute distress.    Appearance: Normal appearance. She is well-developed. She is not ill-appearing.  HENT:     Head: Normocephalic and atraumatic.     Nose: Nose normal.     Mouth/Throat:  Mouth: Mucous membranes are moist.     Pharynx: Oropharynx is clear.  Eyes:     General: No scleral icterus.    Extraocular Movements: Extraocular movements intact.     Pupils: Pupils are equal, round, and reactive to light.  Cardiovascular:     Rate and Rhythm: Normal rate.  Pulmonary:     Effort: Pulmonary effort is normal.  Musculoskeletal:       Hands:  Skin:    General: Skin is warm and dry.  Neurological:     General: No focal deficit present.     Mental Status: She is alert and oriented to person, place, and time.  Psychiatric:        Mood and Affect: Mood normal.        Behavior: Behavior normal.     PROCEDURE NOTE: laceration repair Verbal consent obtained from patient.  Local anesthesia with 3cc Lidocaine 1% with epinephrine.  Wound explored for tendon, ligament damage. Wound scrubbed with soap and water and rinsed. Wound closed with #2 4-0 Prolene (simple interrupted) sutures.  Wound cleansed and dressed.   Assessment and Plan :   PDMP not reviewed this encounter.  1. Left hand pain   2. Laceration of left hand, foreign body presence unspecified, initial encounter     Laceration repaired successfully. Wound care reviewed. Recommended Tylenol and/or ibuprofen for pain control. Return-to-clinic  precautions discussed, patient verbalized understanding. Otherwise, follow up in 7-10 days for suture removal. Counseled patient on potential for adverse effects with medications prescribed/recommended today, ER and return-to-clinic precautions discussed, patient verbalized understanding.     Jaynee Eagles, [place] 04/08/21 [phone]

## 2021-04-08 NOTE — ED Triage Notes (Signed)
Pt in with c/o laceration to left hand that occurred last night when  She cut herself with a knife  Denies any numbness or tingling, no active bleeding noted

## 2021-04-08 NOTE — Discharge Instructions (Addendum)
WOUND CARE Please return in 7-10 days to have your stitches/staples removed or sooner if you have concerns.  Keep area clean and dry for 12 hours. Do not remove bandage, if applied.  After 12 hours, remove bandage and wash wound gently with mild soap and warm water. Reapply a new bandage after cleaning wound, if directed.  Continue daily cleansing with soap and water until stitches/staples are removed.  Do not apply any ointments or creams to the wound while stitches/staples are in place, as this may cause delayed healing.  Notify the office if you experience any of the following signs of infection: Swelling, redness, pus drainage, streaking, fever >101.0 F  Notify the office if you experience excessive bleeding that does not stop after 15-20 minutes of constant, firm pressure.

## 2021-04-10 ENCOUNTER — Other Ambulatory Visit (HOSPITAL_COMMUNITY): Payer: Self-pay

## 2021-04-10 MED ORDER — BUPROPION HCL ER (XL) 300 MG PO TB24
300.0000 mg | ORAL_TABLET | Freq: Every day | ORAL | 0 refills | Status: DC
  Filled 2021-04-10: qty 30, 30d supply, fill #0

## 2021-04-10 MED ORDER — BUPROPION HCL ER (XL) 300 MG PO TB24
300.0000 mg | ORAL_TABLET | Freq: Every day | ORAL | 1 refills | Status: DC
  Filled 2021-05-14: qty 90, 90d supply, fill #0

## 2021-04-10 MED FILL — Losartan Potassium Tab 50 MG: ORAL | 90 days supply | Qty: 90 | Fill #0 | Status: AC

## 2021-04-11 ENCOUNTER — Other Ambulatory Visit (HOSPITAL_COMMUNITY): Payer: Self-pay

## 2021-04-28 MED FILL — Hydrochlorothiazide Tab 25 MG: ORAL | 90 days supply | Qty: 45 | Fill #0 | Status: AC

## 2021-04-28 MED FILL — Esterified Estrogens & Methyltestosterone Tab 0.625-1.25 MG: ORAL | 30 days supply | Qty: 30 | Fill #0 | Status: AC

## 2021-04-29 ENCOUNTER — Other Ambulatory Visit (HOSPITAL_COMMUNITY): Payer: Self-pay

## 2021-04-29 MED ORDER — AMPHETAMINE-DEXTROAMPHET ER 15 MG PO CP24
ORAL_CAPSULE | ORAL | 0 refills | Status: DC
  Filled 2021-04-29: qty 30, 30d supply, fill #0

## 2021-05-10 MED FILL — Trazodone HCl Tab 50 MG: ORAL | 90 days supply | Qty: 90 | Fill #0 | Status: AC

## 2021-05-11 ENCOUNTER — Other Ambulatory Visit (HOSPITAL_COMMUNITY): Payer: Self-pay

## 2021-05-14 ENCOUNTER — Other Ambulatory Visit (HOSPITAL_COMMUNITY): Payer: Self-pay

## 2021-05-29 ENCOUNTER — Other Ambulatory Visit (HOSPITAL_COMMUNITY): Payer: Self-pay

## 2021-05-30 ENCOUNTER — Other Ambulatory Visit (HOSPITAL_COMMUNITY): Payer: Self-pay

## 2021-05-30 MED ORDER — AMPHETAMINE-DEXTROAMPHET ER 15 MG PO CP24
ORAL_CAPSULE | ORAL | 0 refills | Status: DC
  Filled 2021-05-30: qty 30, 30d supply, fill #0

## 2021-05-30 MED ORDER — AMPHETAMINE-DEXTROAMPHET ER 15 MG PO CP24
ORAL_CAPSULE | ORAL | 0 refills | Status: DC
  Filled 2021-08-28: qty 30, 30d supply, fill #0

## 2021-06-27 ENCOUNTER — Other Ambulatory Visit (HOSPITAL_COMMUNITY): Payer: Self-pay

## 2021-06-27 MED ORDER — AMPHETAMINE-DEXTROAMPHET ER 15 MG PO CP24
ORAL_CAPSULE | ORAL | 0 refills | Status: DC
  Filled 2021-07-01: qty 30, 30d supply, fill #0

## 2021-06-27 MED ORDER — CARESTART COVID-19 HOME TEST VI KIT
PACK | 0 refills | Status: DC
  Filled 2021-06-27: qty 4, 4d supply, fill #0

## 2021-07-01 ENCOUNTER — Other Ambulatory Visit (HOSPITAL_COMMUNITY): Payer: Self-pay

## 2021-07-01 MED ORDER — EST ESTROGENS-METHYLTEST 0.625-1.25 MG PO TABS
ORAL_TABLET | ORAL | 0 refills | Status: DC
  Filled 2021-07-01: qty 45, 90d supply, fill #0

## 2021-07-02 ENCOUNTER — Other Ambulatory Visit (HOSPITAL_COMMUNITY): Payer: Self-pay

## 2021-07-08 MED FILL — Losartan Potassium Tab 50 MG: ORAL | 30 days supply | Qty: 30 | Fill #1 | Status: AC

## 2021-07-09 ENCOUNTER — Other Ambulatory Visit (HOSPITAL_COMMUNITY): Payer: Self-pay

## 2021-07-10 ENCOUNTER — Other Ambulatory Visit (HOSPITAL_COMMUNITY): Payer: Self-pay

## 2021-07-15 ENCOUNTER — Encounter: Payer: Self-pay | Admitting: Gastroenterology

## 2021-07-24 ENCOUNTER — Other Ambulatory Visit (HOSPITAL_COMMUNITY): Payer: Self-pay

## 2021-07-24 MED ORDER — BUPROPION HCL ER (XL) 300 MG PO TB24
300.0000 mg | ORAL_TABLET | Freq: Every day | ORAL | 1 refills | Status: DC
  Filled 2021-07-24: qty 90, 90d supply, fill #0
  Filled 2021-11-03: qty 90, 90d supply, fill #1

## 2021-07-24 MED ORDER — EST ESTROGENS-METHYLTEST 0.625-1.25 MG PO TABS
0.5000 | ORAL_TABLET | Freq: Every day | ORAL | 1 refills | Status: DC
  Filled 2021-07-24 – 2021-10-04 (×2): qty 45, 90d supply, fill #0
  Filled 2022-01-06: qty 45, 90d supply, fill #1

## 2021-07-24 MED ORDER — LOSARTAN POTASSIUM-HCTZ 50-12.5 MG PO TABS
ORAL_TABLET | ORAL | 1 refills | Status: DC
  Filled 2021-07-24: qty 90, 90d supply, fill #0
  Filled 2021-10-28: qty 90, 90d supply, fill #1

## 2021-07-24 MED ORDER — AMPHETAMINE-DEXTROAMPHET ER 15 MG PO CP24: ORAL_CAPSULE | ORAL | 0 refills | Status: DC

## 2021-07-24 MED ORDER — AMPHETAMINE-DEXTROAMPHET ER 15 MG PO CP24
ORAL_CAPSULE | ORAL | 0 refills | Status: DC
  Filled 2021-09-30: qty 30, 30d supply, fill #0

## 2021-07-24 MED ORDER — TRAZODONE HCL 50 MG PO TABS
50.0000 mg | ORAL_TABLET | Freq: Every evening | ORAL | 1 refills | Status: DC
  Filled 2021-07-24: qty 90, 90d supply, fill #0
  Filled 2021-10-26: qty 90, 90d supply, fill #1

## 2021-07-24 MED ORDER — AMPHETAMINE-DEXTROAMPHET ER 15 MG PO CP24
ORAL_CAPSULE | ORAL | 0 refills | Status: DC
  Filled 2021-08-02: qty 30, 30d supply, fill #0

## 2021-07-25 ENCOUNTER — Other Ambulatory Visit (HOSPITAL_COMMUNITY): Payer: Self-pay

## 2021-07-26 ENCOUNTER — Other Ambulatory Visit (HOSPITAL_COMMUNITY): Payer: Self-pay

## 2021-08-02 ENCOUNTER — Other Ambulatory Visit (HOSPITAL_COMMUNITY): Payer: Self-pay

## 2021-08-28 ENCOUNTER — Other Ambulatory Visit (HOSPITAL_COMMUNITY): Payer: Self-pay

## 2021-09-02 ENCOUNTER — Other Ambulatory Visit (HOSPITAL_COMMUNITY): Payer: Self-pay

## 2021-09-04 ENCOUNTER — Telehealth: Payer: Self-pay

## 2021-09-04 NOTE — Telephone Encounter (Signed)
Patient was scheduled for an appointment with Pre-Visit nurse on 09/11/21, however she was on the provider's schedule instead.  Moved the appointment to the next available 09/20/21.  The colonoscopy is 09/23/21. Called the patient to explain the situation and instruct her on the diet prior to the procedure.  The patient did not answer the phone. Left a message with information about changing the Pre-Visit date. Asked that she call back and ask for me.

## 2021-09-04 NOTE — Telephone Encounter (Signed)
Called the patient. No answer. Left her a message to call back asap to discuss.

## 2021-09-06 ENCOUNTER — Encounter: Payer: No Typology Code available for payment source | Admitting: Gastroenterology

## 2021-09-09 ENCOUNTER — Other Ambulatory Visit (HOSPITAL_COMMUNITY): Payer: Self-pay

## 2021-09-09 NOTE — Telephone Encounter (Signed)
Called the patient to discuss. No answer. Left her a detailed message about the appointment being canceled and a new appointment scheduled for 09/20/21. Asked she return my call.

## 2021-09-09 NOTE — Telephone Encounter (Signed)
Patient called back. Sooner appointment found for her. Moved her pre-visit to 09/12/21.

## 2021-09-11 ENCOUNTER — Encounter: Payer: No Typology Code available for payment source | Admitting: Gastroenterology

## 2021-09-12 ENCOUNTER — Other Ambulatory Visit (HOSPITAL_COMMUNITY): Payer: Self-pay

## 2021-09-12 ENCOUNTER — Other Ambulatory Visit: Payer: Self-pay

## 2021-09-12 ENCOUNTER — Ambulatory Visit (AMBULATORY_SURGERY_CENTER): Payer: No Typology Code available for payment source

## 2021-09-12 VITALS — Ht 66.0 in | Wt 160.0 lb

## 2021-09-12 DIAGNOSIS — Z800 Family history of malignant neoplasm of digestive organs: Secondary | ICD-10-CM

## 2021-09-12 DIAGNOSIS — Z1211 Encounter for screening for malignant neoplasm of colon: Secondary | ICD-10-CM

## 2021-09-12 MED ORDER — NA SULFATE-K SULFATE-MG SULF 17.5-3.13-1.6 GM/177ML PO SOLN
1.0000 | Freq: Once | ORAL | 0 refills | Status: AC
  Filled 2021-09-12: qty 354, 7d supply, fill #0

## 2021-09-12 NOTE — Progress Notes (Signed)
Pre visit completed via phone call;  Patient verified name, DOB, and address;  No egg or soy allergy known to patient  No issues known to pt with past sedation with any surgeries or procedures Patient denies ever being told they had issues or difficulty with intubation  No FH of Malignant Hyperthermia Pt is not on diet pills Pt is not on  home 02  Pt is not on blood thinners  Pt denies issues with constipation at this time;  No A fib or A flutter  Pt is fully vaccinated for Covid x 2; NO PA's for preps discussed with pt in PV today  Discussed with pt there will be an out-of-pocket cost for prep and that varies from $0 to 70 +  dollars  Due to the COVID-19 pandemic we are asking patients to follow certain guidelines.  Pt aware of COVID protocols and [facility] guidelines

## 2021-09-17 ENCOUNTER — Encounter: Payer: Self-pay | Admitting: Physician Assistant

## 2021-09-17 ENCOUNTER — Other Ambulatory Visit: Payer: Self-pay

## 2021-09-17 ENCOUNTER — Ambulatory Visit (INDEPENDENT_AMBULATORY_CARE_PROVIDER_SITE_OTHER): Payer: No Typology Code available for payment source | Admitting: Physician Assistant

## 2021-09-17 ENCOUNTER — Other Ambulatory Visit (HOSPITAL_COMMUNITY): Payer: Self-pay

## 2021-09-17 DIAGNOSIS — Z1283 Encounter for screening for malignant neoplasm of skin: Secondary | ICD-10-CM

## 2021-09-17 DIAGNOSIS — L739 Follicular disorder, unspecified: Secondary | ICD-10-CM

## 2021-09-17 DIAGNOSIS — L570 Actinic keratosis: Secondary | ICD-10-CM | POA: Diagnosis not present

## 2021-09-17 MED ORDER — CLOBETASOL PROPIONATE 0.05 % EX SOLN
1.0000 "application " | Freq: Two times a day (BID) | CUTANEOUS | 6 refills | Status: DC
  Filled 2021-09-17: qty 50, 25d supply, fill #0

## 2021-09-18 ENCOUNTER — Encounter: Payer: Self-pay | Admitting: Physician Assistant

## 2021-09-18 NOTE — Progress Notes (Signed)
   Follow-Up Visit   Subjective  [name] [name] is a 55 y.o. female who presents for the following: Annual Exam (Patient here today for yearly skin check, no new concerns. No personal history or family history of atypical moles, melanoma or non mole skin cancer. ).   The following portions of the chart were reviewed this encounter and updated as appropriate:  Tobacco  Allergies  Meds  Problems  Med Hx  Surg Hx  Fam Hx      Objective  Well appearing patient in no apparent distress; mood and affect are within normal limits.  A full examination was performed including scalp, head, eyes, ears, nose, lips, neck, chest, axillae, abdomen, back, buttocks, bilateral upper extremities, bilateral lower extremities, hands, feet, fingers, toes, fingernails, and toenails. All findings within normal limits unless otherwise noted below.  Chest - Medial [facility]) Erythematous patches with gritty scale.       Mid Occipital Scalp Perifollicular erythematous papules and pustules    Assessment & Plan  AK (actinic keratosis) Chest - Medial (Center)  Destruction of lesion - Chest - Medial ([facility]) Complexity: simple   Destruction method: cryotherapy   Informed consent: discussed and consent obtained   Timeout:  patient name, date of birth, surgical site, and procedure verified Lesion destroyed using liquid nitrogen: Yes   Cryotherapy cycles:  1 Outcome: patient tolerated procedure well with no complications   Post-procedure details: wound care instructions given    Folliculitis Mid Occipital Scalp  clobetasol (TEMOVATE) 0.05 % external solution - Mid Occipital Scalp Apply 1 application topically 2 (two) times daily.    I, Tycho Cheramie, PA-C, have reviewed all documentation's for this visit.  The documentation on 09/18/21 for the exam, diagnosis, procedures and orders are all accurate and complete.

## 2021-09-23 ENCOUNTER — Encounter: Payer: Self-pay | Admitting: Gastroenterology

## 2021-09-23 ENCOUNTER — Other Ambulatory Visit: Payer: Self-pay

## 2021-09-23 ENCOUNTER — Ambulatory Visit (AMBULATORY_SURGERY_CENTER): Payer: No Typology Code available for payment source | Admitting: Gastroenterology

## 2021-09-23 VITALS — BP 110/74 | HR 70 | Temp 98.4°F | Resp 18 | Ht 66.0 in | Wt 160.0 lb

## 2021-09-23 DIAGNOSIS — Z1211 Encounter for screening for malignant neoplasm of colon: Secondary | ICD-10-CM | POA: Diagnosis not present

## 2021-09-23 DIAGNOSIS — Z800 Family history of malignant neoplasm of digestive organs: Secondary | ICD-10-CM

## 2021-09-23 DIAGNOSIS — D122 Benign neoplasm of ascending colon: Secondary | ICD-10-CM | POA: Diagnosis not present

## 2021-09-23 DIAGNOSIS — K6289 Other specified diseases of anus and rectum: Secondary | ICD-10-CM

## 2021-09-23 DIAGNOSIS — K5100 Ulcerative (chronic) pancolitis without complications: Secondary | ICD-10-CM

## 2021-09-23 DIAGNOSIS — K635 Polyp of colon: Secondary | ICD-10-CM | POA: Diagnosis not present

## 2021-09-23 MED ORDER — SODIUM CHLORIDE 0.9 % IV SOLN: 500.0000 mL | Freq: Once | INTRAVENOUS | Status: DC

## 2021-09-23 NOTE — Progress Notes (Signed)
Called to room to assist during endoscopic procedure.  Patient ID and intended procedure confirmed with present staff. Received instructions for my participation in the procedure from the performing physician.  

## 2021-09-23 NOTE — Patient Instructions (Signed)
Please read handouts provided. Continue present medications. Repeat colonoscopy in 5 years for screening.   YOU HAD AN ENDOSCOPIC PROCEDURE TODAY AT [facility]:   Refer to the procedure report that was given to you for any specific questions about what was found during the examination.  If the procedure report does not answer your questions, please call your gastroenterologist to clarify.  If you requested that your care partner not be given the details of your procedure findings, then the procedure report has been included in a sealed envelope for you to review at your convenience later.  YOU SHOULD EXPECT: Some feelings of bloating in the abdomen. Passage of more gas than usual.  Walking can help get rid of the air that was put into your GI tract during the procedure and reduce the bloating. If you had a lower endoscopy (such as a colonoscopy or flexible sigmoidoscopy) you may notice spotting of blood in your stool or on the toilet paper. If you underwent a bowel prep for your procedure, you may not have a normal bowel movement for a few days.  Please Note:  You might notice some irritation and congestion in your nose or some drainage.  This is from the oxygen used during your procedure.  There is no need for concern and it should clear up in a day or so.  SYMPTOMS TO REPORT IMMEDIATELY:  Following lower endoscopy (colonoscopy or flexible sigmoidoscopy):  Excessive amounts of blood in the stool  Significant tenderness or worsening of abdominal pains  Swelling of the abdomen that is new, acute  Fever of 100F or higher   For urgent or emergent issues, a gastroenterologist can be reached at any hour by calling [phone]. Do not use MyChart messaging for urgent concerns.    DIET:  We do recommend a small meal at first, but then you may proceed to your regular diet.  Drink plenty of fluids but you should avoid alcoholic beverages for 24 hours.  ACTIVITY:  You should  plan to take it easy for the rest of today and you should NOT DRIVE or use heavy machinery until tomorrow (because of the sedation medicines used during the test).    FOLLOW UP: Our staff will call the number listed on your records 48-72 hours following your procedure to check on you and address any questions or concerns that you may have regarding the information given to you following your procedure. If we do not reach you, we will leave a message.  We will attempt to reach you two times.  During this call, we will ask if you have developed any symptoms of COVID 19. If you develop any symptoms (ie: fever, flu-like symptoms, shortness of breath, cough etc.) before then, please call [phone].  If you test positive for Covid 19 in the 2 weeks post procedure, please call and report this information to [place].    If any biopsies were taken you will be contacted by phone or by letter within the next 1-3 weeks.  Please call us at [phone] if you have not heard about the biopsies in 3 weeks.    SIGNATURES/CONFIDENTIALITY: You and/or your care partner have signed paperwork which will be entered into your electronic medical record.  These signatures attest to the fact that that the information above on your After Visit Summary has been reviewed and is understood.  Full responsibility of the confidentiality of this discharge information lies with you and/or your care-partner.

## 2021-09-23 NOTE — Progress Notes (Signed)
[facility] History and Physical   Primary Care Physician:  Scifres, Earlie Server, PA-C   Reason for Procedure:  Family history of colon cancer  Plan:    Screening colonoscopy with possible interventions as needed     HPI: [name] [name] is a very pleasant 55 y.o. femalehere for screening colonoscopy. Denies any nausea, vomiting, abdominal pain, melena or bright red blood per rectum  The risks and benefits as well as alternatives of endoscopic procedure(s) have been discussed and reviewed. All questions answered. The patient agrees to proceed.    Past Medical History:  Diagnosis Date   ADD (attention deficit disorder)    Anxiety    Cervical radiculopathy 2014   L arm (into L4th and 5th fingers)   Depression    Endometriosis    Hypertension    on meds   Interstitial cystitis    OA (osteoarthritis)    PONV (postoperative nausea and vomiting)    Right knee meniscal tear    Wears glasses     Past Surgical History:  Procedure Laterality Date   BLADDER SURGERY     due to interstitial cystitis   BREAST REDUCTION SURGERY  1988   CHONDROPLASTY Right 05/11/2019   Procedure: CHONDROPLASTY;  Surgeon: Gaynelle Arabian, MD;  Location: [facility];  Service: Orthopedics;  Laterality: Right;   CLOSED MANIPULATION SHOULDER Right 11/15/2009   CYSTOSCOPY WITH HYDRODISTENSION AND BIOPSY  01-17-2004    dr Risa Grill  @[facility]    for [facility];  05-28-2006   KNEE ARTHROSCOPY Bilateral right 1996;  left 11-02-2008   KNEE ARTHROSCOPY Right 05/11/2019   Procedure: ARTHROSCOPY KNEE, MEDIAL MENISCAL DEBRIDEMENT;  Surgeon: Gaynelle Arabian, MD;  Location: [facility];  Service: Orthopedics;  Laterality: Right;   KNEE ARTHROSCOPY WITH MEDIAL MENISECTOMY Right 08/13/2018   Procedure: RIGHT KNEE ARTHROSCOPY WITH DEBRIDEMENT chondroplasty;  Surgeon: Mcarthur Rossetti, MD;  Location: [facility];  Service: Orthopedics;  Laterality: Right;    REDUCTION MAMMAPLASTY     SHOULDER ARTHROSCOPY Bilateral right 08-07-2009;  let 04-17-2011   TOTAL ABDOMINAL HYSTERECTOMY W/ BILATERAL SALPINGOOPHORECTOMY  05-19-2007   dr Bing Matter  @[facility]     Prior to Admission medications   Medication Sig Start Date End Date Taking? Authorizing Provider  amphetamine-dextroamphetamine (ADDERALL XR) 15 MG 24 hr capsule Take 1 capsule by mouth once daily in the morning  (30 day supply)..MAY FILL 09/29/21 09/29/21  Yes   Blood Pressure Monitoring (OMRON 3 SERIES BP MONITOR) DEVI USE AS DIRECTED 09/27/20 09/27/21 Yes   buPROPion (WELLBUTRIN XL) 300 MG 24 hr tablet Take 1 tablet (300 mg total) by mouth daily. 07/24/21  Yes   estrogen-methylTESTOSTERone 0.625-1.25 MG tablet Take 1/2 tablet by mouth once daily. 07/24/21  Yes   losartan-hydrochlorothiazide (HYZAAR) 50-12.5 MG tablet Take 1 tablet by mouth daily 07/24/21  Yes   traZODone (DESYREL) 50 MG tablet Take 1 tablet (50 mg total) by mouth at bedtime. 07/24/21  Yes   acetaminophen (TYLENOL) 500 MG tablet Take 1,000 mg by mouth every 6 (six) hours as needed for mild pain or headache.    [provider]    Current Outpatient Medications  Medication Sig Dispense Refill   [START ON 09/29/2021] amphetamine-dextroamphetamine (ADDERALL XR) 15 MG 24 hr capsule Take 1 capsule by mouth once daily in the morning  (30 day supply)..MAY FILL 09/29/21 30 capsule 0   Blood Pressure Monitoring (OMRON 3 SERIES BP MONITOR) DEVI USE AS DIRECTED 1 each 0   buPROPion (  WELLBUTRIN XL) 300 MG 24 hr tablet Take 1 tablet (300 mg total) by mouth daily. 90 tablet 1   estrogen-methylTESTOSTERone 0.625-1.25 MG tablet Take 1/2 tablet by mouth once daily. 90 tablet 1   losartan-hydrochlorothiazide (HYZAAR) 50-12.5 MG tablet Take 1 tablet by mouth daily 90 tablet 1   traZODone (DESYREL) 50 MG tablet Take 1 tablet (50 mg total) by mouth at bedtime. 90 tablet 1   acetaminophen (TYLENOL) 500 MG tablet Take 1,000 mg by mouth every 6 (six) hours  as needed for mild pain or headache.     Current Facility-Administered Medications  Medication Dose Route Frequency Provider Last Rate Last Admin   0.9 %  sodium chloride infusion  500 mL Intravenous Once Mauri Pole, MD        Allergies as of 09/23/2021   (No Known Allergies)    Family History  Problem Relation Age of Onset   Colon polyps Mother    Heart disease Father    Stroke Father    Mental illness Father    Mental illness Sister    Stroke Brother    Diabetes Brother    Hyperlipidemia Brother    Colon cancer Maternal Uncle    Colon polyps Maternal Grandmother    Colon cancer Maternal Grandmother    Breast cancer Neg Hx    Stomach cancer Neg Hx    Rectal cancer Neg Hx    Esophageal cancer Neg Hx     Social History   Socioeconomic History   Marital status: Married    Spouse name: Not on file   Number of children: 2   Years of education: Not on file   Highest education level: Not on file  Occupational History   Not on file  Tobacco Use   Smoking status: Former    Years: 4.00    Types: Cigarettes    Quit date: 08/04/1990    Years since quitting: 31.1   Smokeless tobacco: Never  Vaping Use   Vaping Use: Never used  Substance and Sexual Activity   Alcohol use: Yes    Alcohol/week: 2.0 standard drinks    Types: 2 Standard drinks or equivalent per week    Comment: rare   Drug use: No   Sexual activity: Yes    Birth control/protection: Surgical  Other Topics Concern   Not on file  Social History Narrative   Not on file   Social Determinants of Health   Financial Resource Strain: Not on file  Food Insecurity: Not on file  Transportation Needs: Not on file  Physical Activity: Not on file  Stress: Not on file  Social Connections: Not on file  Intimate Partner Violence: Not on file    Review of Systems:  All other review of systems negative except as mentioned in the HPI.  Physical Exam: Vital signs in last 24 hours: BP 104/60   Pulse 60    Temp 98.4 F (36.9 C)   Ht 5\' 6"  (1.676 m)   Wt 160 lb (72.6 kg)   SpO2 97%   BMI 25.82 kg/m     General:   Alert, NAD Lungs:  Clear .   Heart:  Regular rate and rhythm Abdomen:  Soft, nontender and nondistended. Neuro/Psych:  Alert and cooperative. Normal mood and affect. A and O x 3  Reviewed labs, radiology imaging, old records and pertinent past GI work up  Patient is appropriate for planned procedure(s) and anesthesia in an ambulatory setting   K. Denzil Magnuson ,  MD [phone]

## 2021-09-23 NOTE — Progress Notes (Signed)
PT taken to PACU. Monitors in place. VSS. Report given to RN. 

## 2021-09-23 NOTE — Progress Notes (Signed)
Pt's states no medical or surgical changes since previsit or office visit. Vitals by CW 

## 2021-09-23 NOTE — Op Note (Signed)
[facility] Patient Name: [name] [name] Procedure Date: 09/23/2021 11:57 AM MRN: [id number] Endoscopist: Mauri Pole , MD Age: 55 Referring MD:  Date of Birth: September 28, 1966 Gender: Female Account #: [id number] Procedure:                Colonoscopy Indications:              Colon cancer screening in patient at increased                            risk: Family history of colorectal cancer in                            multiple 2nd degree relatives Medicines:                Monitored Anesthesia Care Procedure:                Pre-Anesthesia Assessment:                           - Prior to the procedure, a History and Physical                            was performed, and patient medications and                            allergies were reviewed. The patient's tolerance of                            previous anesthesia was also reviewed. The risks                            and benefits of the procedure and the sedation                            options and risks were discussed with the patient.                            All questions were answered, and informed consent                            was obtained. Prior Anticoagulants: The patient has                            taken no previous anticoagulant or antiplatelet                            agents. ASA Grade Assessment: II - A patient with                            mild systemic disease. After reviewing the risks                            and benefits, the patient was deemed in  satisfactory condition to undergo the procedure.                           After obtaining informed consent, the colonoscope                            was passed under direct vision. Throughout the                            procedure, the patient's blood pressure, pulse, and                            oxygen saturations were monitored continuously. The                            Olympus PCF-H190DL ([id number])  Colonoscope was                            introduced through the anus and advanced to the the                            cecum, identified by appendiceal orifice and                            ileocecal valve. The colonoscopy was performed                            without difficulty. The patient tolerated the                            procedure well. The quality of the bowel                            preparation was adequate. The ileocecal valve,                            appendiceal orifice, and rectum were photographed. Scope In: 12:03:56 PM Scope Out: 12:23:42 PM Scope Withdrawal Time: 0 hours 12 minutes 35 seconds  Total Procedure Duration: 0 hours 19 minutes 46 seconds  Findings:                 The perianal and digital rectal examinations were                            normal.                           A 7 mm polyp was found in the ascending colon. The                            polyp was sessile. The polyp was removed with a                            cold snare. Resection and retrieval were complete.  Nonbleeding superficial linear ulcerated mucosa                            were present in the rectum. Biopsies were taken                            with a cold forceps for histology.                           Non-bleeding external and internal hemorrhoids were                            found during retroflexion. The hemorrhoids were                            medium-sized.                           The exam was otherwise without abnormality. Complications:            No immediate complications. Estimated Blood Loss:     Estimated blood loss was minimal. Impression:               - One 7 mm polyp in the ascending colon, removed                            with a cold snare. Resected and retrieved.                           - Mucosal ulceration. Biopsied.                           - Non-bleeding external and internal hemorrhoids.                            - The examination was otherwise normal. Recommendation:           - Patient has a contact number available for                            emergencies. The signs and symptoms of potential                            delayed complications were discussed with the                            patient. Return to normal activities tomorrow.                            Written discharge instructions were provided to the                            patient.                           - Resume previous diet.                           -  Continue present medications.                           - Await pathology results.                           - Repeat colonoscopy in 5 years for surveillance                            based on pathology results. Mauri Pole, MD 09/23/2021 12:31:10 PM This report has been signed electronically.

## 2021-09-25 ENCOUNTER — Telehealth: Payer: Self-pay

## 2021-09-25 NOTE — Telephone Encounter (Signed)
Left message on follow up call. 

## 2021-09-25 NOTE — Telephone Encounter (Signed)
  Follow up Call-  Call back number 09/23/2021  Post procedure Call Back phone  # [phone]  Permission to leave phone message Yes  Some recent data might be hidden     Attempted to call, no answer

## 2021-09-27 ENCOUNTER — Other Ambulatory Visit: Payer: Self-pay | Admitting: Physician Assistant

## 2021-09-27 ENCOUNTER — Other Ambulatory Visit (HOSPITAL_COMMUNITY): Payer: Self-pay

## 2021-09-27 DIAGNOSIS — Z1231 Encounter for screening mammogram for malignant neoplasm of breast: Secondary | ICD-10-CM

## 2021-09-30 ENCOUNTER — Other Ambulatory Visit (HOSPITAL_COMMUNITY): Payer: Self-pay

## 2021-10-05 ENCOUNTER — Other Ambulatory Visit (HOSPITAL_COMMUNITY): Payer: Self-pay

## 2021-10-10 ENCOUNTER — Encounter: Payer: Self-pay | Admitting: Gastroenterology

## 2021-10-10 ENCOUNTER — Telehealth: Payer: Self-pay

## 2021-10-10 NOTE — Telephone Encounter (Signed)
Received from Dr. Silverio Decamp:  Please schedule follow-up office visit next available appointment with me for follow-up of mild proctitis.  Thank you  Per Dr. Woodward Ku request, called pt to schedule f/u appt. Declined to schedule at this time as she is driving. Advised she will call back tomorrow to schedule appt.

## 2021-10-10 NOTE — Telephone Encounter (Signed)
-----   Message from Mauri Pole, MD sent at 10/10/2021  2:18 PM EDT ----- Please schedule follow-up office visit next available appointment with me for follow-up of mild proctitis.  Thank you

## 2021-10-11 ENCOUNTER — Other Ambulatory Visit (HOSPITAL_COMMUNITY): Payer: Self-pay

## 2021-10-11 NOTE — Telephone Encounter (Signed)
Patient returned the call and was scheduled to follow up on 12/18/21.

## 2021-10-26 ENCOUNTER — Other Ambulatory Visit (HOSPITAL_COMMUNITY): Payer: Self-pay

## 2021-10-28 ENCOUNTER — Other Ambulatory Visit (HOSPITAL_COMMUNITY): Payer: Self-pay

## 2021-10-29 ENCOUNTER — Other Ambulatory Visit (HOSPITAL_COMMUNITY): Payer: Self-pay

## 2021-10-30 ENCOUNTER — Other Ambulatory Visit (HOSPITAL_COMMUNITY): Payer: Self-pay

## 2021-10-30 MED ORDER — AMPHETAMINE-DEXTROAMPHET ER 15 MG PO CP24
ORAL_CAPSULE | ORAL | 0 refills | Status: DC
  Filled 2021-12-30: qty 30, 30d supply, fill #0

## 2021-10-30 MED ORDER — AMPHETAMINE-DEXTROAMPHET ER 15 MG PO CP24
ORAL_CAPSULE | ORAL | 0 refills | Status: DC
  Filled 2021-10-30: qty 30, 30d supply, fill #0

## 2021-10-30 MED ORDER — AMPHETAMINE-DEXTROAMPHET ER 15 MG PO CP24
ORAL_CAPSULE | ORAL | 0 refills | Status: DC
  Filled 2021-11-28: qty 30, 30d supply, fill #0

## 2021-10-31 ENCOUNTER — Ambulatory Visit
Admission: RE | Admit: 2021-10-31 | Discharge: 2021-10-31 | Disposition: A | Discharge status: Home / Self Care | Payer: No Typology Code available for payment source | Source: Ambulatory Visit | Attending: Physician Assistant | Admitting: Physician Assistant

## 2021-10-31 ENCOUNTER — Other Ambulatory Visit: Payer: Self-pay

## 2021-10-31 DIAGNOSIS — Z1231 Encounter for screening mammogram for malignant neoplasm of breast: Secondary | ICD-10-CM

## 2021-11-04 ENCOUNTER — Other Ambulatory Visit (HOSPITAL_COMMUNITY): Payer: Self-pay

## 2021-11-27 ENCOUNTER — Other Ambulatory Visit (HOSPITAL_COMMUNITY): Payer: Self-pay

## 2021-11-28 ENCOUNTER — Other Ambulatory Visit (HOSPITAL_COMMUNITY): Payer: Self-pay

## 2021-12-18 ENCOUNTER — Ambulatory Visit: Payer: No Typology Code available for payment source | Admitting: Gastroenterology

## 2021-12-18 ENCOUNTER — Other Ambulatory Visit (HOSPITAL_COMMUNITY): Payer: Self-pay

## 2021-12-18 ENCOUNTER — Encounter: Payer: Self-pay | Admitting: Gastroenterology

## 2021-12-18 VITALS — BP 100/80 | HR 88 | Ht 66.0 in | Wt 164.0 lb

## 2021-12-18 DIAGNOSIS — K6289 Other specified diseases of anus and rectum: Secondary | ICD-10-CM

## 2021-12-18 DIAGNOSIS — K5732 Diverticulitis of large intestine without perforation or abscess without bleeding: Secondary | ICD-10-CM | POA: Diagnosis not present

## 2021-12-18 DIAGNOSIS — R1032 Left lower quadrant pain: Secondary | ICD-10-CM

## 2021-12-18 DIAGNOSIS — K5904 Chronic idiopathic constipation: Secondary | ICD-10-CM

## 2021-12-18 MED ORDER — AMOXICILLIN-POT CLAVULANATE 875-125 MG PO TABS
1.0000 | ORAL_TABLET | Freq: Two times a day (BID) | ORAL | 0 refills | Status: DC
  Filled 2021-12-18: qty 10, 5d supply, fill #0

## 2021-12-18 NOTE — Progress Notes (Signed)
[name] [name]    [id number]    [birth year]  Primary Care Physician:Scifres, Durel Salts  Referring Physician: Maude Leriche, PA-C [place],  [place] [address]   Chief complaint:  L side back pain  HPI: 56 year old very pleasant female here for follow-up visit with complaints of left lower quadrant abdominal pain and constipation for past few days. She is experiencing left lower quadrant abdominal pain radiating to the back and into the groin for past few days and she has not had a bowel movement.  Denies any rectal bleeding.  She feels nauseous when she has the urge to have a bowel movement or strains Denies any melena, vomiting, decreased appetite or unintentional weight loss.  Colonoscopy 09/23/21 - The perianal and digital rectal examinations were normal. - A 7 mm polyp was found in the ascending colon. The polyp was sessile. The polyp was removed with a cold snare. Resection and retrieval were complete. - Nonbleeding superficial linear ulcerated mucosa were present in the rectum. Biopsies were taken with a cold forceps for histology. - Non-bleeding external and internal hemorrhoids were found during retroflexion. The hemorrhoids were medium-sized. - The exam was otherwise without abnormality.  1. Surgical [P], colon, ascending, polyp (1) - HYPERPLASTIC POLYP - MULTIPLE STEP SECTIONS WERE EXAMINED 2. Surgical [P], colon, rectum - MILDLY ACTIVE NONSPECIFIC PROCTITIS. SEE NOTE - NEGATIVE FOR FEATURES OF CHRONICITY OR GRANULOMAS Diagnosis Note 2. Differential diagnosis can include drug-effect, infection, stercoral proctitis and ischemia.   Outpatient Encounter Medications as of 12/18/2021  Medication Sig   acetaminophen (TYLENOL) 500 MG tablet Take 1,000 mg by mouth every 6 (six) hours as needed for mild pain or headache.   amphetamine-dextroamphetamine (ADDERALL XR) 15 MG 24 hr capsule Take 1 capsule by mouth once daily in the morning.   Fill 30 days after last fill.   buPROPion (WELLBUTRIN XL) 300 MG 24 hr tablet Take 1 tablet (300 mg total) by mouth daily.   estrogen-methylTESTOSTERone 0.625-1.25 MG tablet Take 1/2 tablet by mouth once daily.   losartan-hydrochlorothiazide (HYZAAR) 50-12.5 MG tablet Take 1 tablet by mouth daily   traZODone (DESYREL) 50 MG tablet Take 1 tablet (50 mg total) by mouth at bedtime.   [DISCONTINUED] amphetamine-dextroamphetamine (ADDERALL XR) 15 MG 24 hr capsule Take 1 capsule by mouth once a daily in the morning. Please fill 30 days after last fill   [DISCONTINUED] amphetamine-dextroamphetamine (ADDERALL XR) 15 MG 24 hr capsule Take 1 capsule by mouth once daily in the morning.  Please fill 30 days after last fill   No facility-administered encounter medications on file as of 12/18/2021.    Allergies as of 12/18/2021   (No Known Allergies)    Past Medical History:  Diagnosis Date   ADD (attention deficit disorder)    Anxiety    Cervical radiculopathy 2014   L arm (into L4th and 5th fingers)   Depression    Endometriosis    Hypertension    on meds   Interstitial cystitis    OA (osteoarthritis)    PONV (postoperative nausea and vomiting)    Right knee meniscal tear    Wears glasses     Past Surgical History:  Procedure Laterality Date   BLADDER SURGERY     due to interstitial cystitis   BREAST REDUCTION SURGERY  1988   CHONDROPLASTY Right 05/11/2019   Procedure: CHONDROPLASTY;  Surgeon: Gaynelle Arabian, MD;  Location: [facility];  Service: Orthopedics;  Laterality: Right;   CLOSED MANIPULATION SHOULDER Right 11/15/2009   CYSTOSCOPY WITH HYDRODISTENSION AND BIOPSY  01-17-2004    dr Risa Grill  @[facility]    for [facility];  05-28-2006   KNEE ARTHROSCOPY Bilateral right 1996;  left 11-02-2008   KNEE ARTHROSCOPY Right 05/11/2019   Procedure: ARTHROSCOPY KNEE, MEDIAL MENISCAL DEBRIDEMENT;  Surgeon: Gaynelle Arabian, MD;  Location: [facility];   Service: Orthopedics;  Laterality: Right;   KNEE ARTHROSCOPY WITH MEDIAL MENISECTOMY Right 08/13/2018   Procedure: RIGHT KNEE ARTHROSCOPY WITH DEBRIDEMENT chondroplasty;  Surgeon: Mcarthur Rossetti, MD;  Location: [facility];  Service: Orthopedics;  Laterality: Right;   REDUCTION MAMMAPLASTY     SHOULDER ARTHROSCOPY Bilateral right 08-07-2009;  let 04-17-2011   TOTAL ABDOMINAL HYSTERECTOMY W/ BILATERAL SALPINGOOPHORECTOMY  05-19-2007   dr Bing Matter  @[facility]     Family History  Problem Relation Age of Onset   Colon polyps Mother    Heart disease Father    Stroke Father    Mental illness Father    Mental illness Sister    Stroke Brother    Diabetes Brother    Hyperlipidemia Brother    Colon cancer Maternal Uncle    Colon polyps Maternal Grandmother    Colon cancer Maternal Grandmother    Breast cancer Neg Hx    Stomach cancer Neg Hx    Rectal cancer Neg Hx    Esophageal cancer Neg Hx     Social History   Socioeconomic History   Marital status: Married    Spouse name: Not on file   Number of children: 2   Years of education: Not on file   Highest education level: Not on file  Occupational History   Not on file  Tobacco Use   Smoking status: Former    Years: 4.00    Types: Cigarettes    Quit date: 08/04/1990    Years since quitting: 31.3   Smokeless tobacco: Never  Vaping Use   Vaping Use: Never used  Substance and Sexual Activity   Alcohol use: Yes    Alcohol/week: 2.0 standard drinks    Types: 2 Standard drinks or equivalent per week    Comment: rare   Drug use: No   Sexual activity: Yes    Birth control/protection: Surgical  Other Topics Concern   Not on file  Social History Narrative   Not on file   Social Determinants of Health   Financial Resource Strain: Not on file  Food Insecurity: Not on file  Transportation Needs: Not on file  Physical Activity: Not on file  Stress: Not on file  Social Connections: Not on file  Intimate Partner Violence: Not on  file      Review of systems: All other review of systems negative except as mentioned in the HPI.   Physical Exam: Vitals:   12/18/21 0937  BP: 100/80  Pulse: 88   Body mass index is 26.47 kg/m. Gen:      No acute distress HEENT:  sclera anicteric Abd:      soft, non-tender; no palpable masses, no distension Ext:    No edema Neuro: alert and oriented x 3 Psych: normal mood and affect  Data Reviewed:  Reviewed labs, radiology imaging, old records and pertinent past GI work up   Assessment and Plan/Recommendations:  56 year old very pleasant female with left lower quadrant abdominal pain and worsening constipation concerning for possible acute sigmoid diverticulitis Plan to treat with Augmentin 825  mg twice daily for 5-day course If no improvement in symptoms, will consider CT abdomen pelvis with contrast for further evaluation  Chronic idiopathic constipation: Bowel purge with MiraLAX and then start using MiraLAX half capful daily and titrate dose based on response Use Benefiber 1 tablespoon twice daily with meals Increase daily water intake to 8 to 10 cups  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  She currently has no rectal bleeding.  Return in 2 to 3 months.  The patient was provided an opportunity to ask questions and all were answered. The patient agreed with the plan and demonstrated an understanding of the instructions.  Damaris Hippo , MD    CC: Scifres, Earlie Server, PA-C

## 2021-12-18 NOTE — Patient Instructions (Signed)
Dr Silverio Decamp recommends that you complete a bowel purge (to clean out your bowels). Please do the following: Purchase a bottle of Miralax over the counter as well as a box of 5 mg dulcolax tablets. Take 4 dulcolax tablets. Wait 1 hour. You will then drink 6-8 capfuls of Miralax mixed in an adequate amount of water/juice/gatorade (you may choose which of these liquids to drink) over the next 2-3 hours. You should expect results within 1 to 6 hours after completing the bowel purge.   We will send Augmentin 875 mg for twice a day x 5 days to your pharmacy  Take Benefiber 1 tablespoon twice a day with meals  Take Miralax 1/2 capful daily and titrate based on response  If you are age 52 or older, your body mass index should be between 23-30. Your Body mass index is 26.47 kg/m. If this is out of the aforementioned range listed, please consider follow up with your Primary Care Provider.  If you are age 73 or younger, your body mass index should be between 19-25. Your Body mass index is 26.47 kg/m. If this is out of the aformentioned range listed, please consider follow up with your Primary Care Provider.   ________________________________________________________  The [facility] providers would like to encourage you to use [facility] to communicate with providers for non-urgent requests or questions.  Due to long hold times on the telephone, sending your provider a message by [facility] may be a faster and more efficient way to get a response.  Please allow 48 business hours for a response.  Please remember that this is for non-urgent requests.  _______________________________________________________   Thank you for choosing [facility]  Kavitha Nandigam,MD

## 2021-12-19 ENCOUNTER — Encounter: Payer: Self-pay | Admitting: Gastroenterology

## 2021-12-30 ENCOUNTER — Other Ambulatory Visit (HOSPITAL_COMMUNITY): Payer: Self-pay

## 2022-01-06 ENCOUNTER — Other Ambulatory Visit (HOSPITAL_COMMUNITY): Payer: Self-pay

## 2022-01-27 ENCOUNTER — Other Ambulatory Visit (HOSPITAL_COMMUNITY): Payer: Self-pay

## 2022-01-27 MED ORDER — TRAZODONE HCL 50 MG PO TABS
50.0000 mg | ORAL_TABLET | Freq: Every evening | ORAL | 1 refills | Status: DC
  Filled 2022-01-27: qty 90, 90d supply, fill #0

## 2022-01-27 MED ORDER — AMPHETAMINE-DEXTROAMPHET ER 15 MG PO CP24
15.0000 mg | ORAL_CAPSULE | Freq: Every day | ORAL | 0 refills | Status: DC
  Filled 2022-01-28: qty 30, 30d supply, fill #0

## 2022-01-27 MED ORDER — LOSARTAN POTASSIUM-HCTZ 50-12.5 MG PO TABS
ORAL_TABLET | ORAL | 1 refills | Status: DC
  Filled 2022-01-27: qty 90, 90d supply, fill #0

## 2022-01-28 ENCOUNTER — Other Ambulatory Visit (HOSPITAL_COMMUNITY): Payer: Self-pay

## 2022-02-03 ENCOUNTER — Other Ambulatory Visit (HOSPITAL_COMMUNITY): Payer: Self-pay

## 2022-02-03 MED ORDER — BUPROPION HCL ER (XL) 300 MG PO TB24
ORAL_TABLET | ORAL | 0 refills | Status: DC
  Filled 2022-02-03: qty 90, 90d supply, fill #0

## 2022-02-13 ENCOUNTER — Other Ambulatory Visit (HOSPITAL_COMMUNITY): Payer: Self-pay

## 2022-02-13 MED ORDER — AMPHETAMINE-DEXTROAMPHET ER 15 MG PO CP24
ORAL_CAPSULE | ORAL | 0 refills | Status: DC
  Filled 2022-03-31: qty 30, 30d supply, fill #0

## 2022-02-13 MED ORDER — LOSARTAN POTASSIUM-HCTZ 50-12.5 MG PO TABS
ORAL_TABLET | ORAL | 1 refills | Status: DC
Start: 2022-02-13 — End: 2023-09-22
  Filled 2022-04-28 – 2022-07-21 (×2): qty 90, 90d supply, fill #0

## 2022-02-13 MED ORDER — AMPHETAMINE-DEXTROAMPHET ER 15 MG PO CP24
ORAL_CAPSULE | ORAL | 0 refills | Status: DC
  Filled 2022-02-24: qty 30, 30d supply, fill #0

## 2022-02-13 MED ORDER — BUPROPION HCL ER (XL) 300 MG PO TB24
ORAL_TABLET | ORAL | 1 refills | Status: AC
  Filled 2022-05-05: qty 90, 90d supply, fill #0
  Filled 2022-08-01: qty 90, 90d supply, fill #1

## 2022-02-13 MED ORDER — TRAZODONE HCL 50 MG PO TABS
ORAL_TABLET | ORAL | 1 refills | Status: DC
  Filled 2022-04-21: qty 90, 90d supply, fill #0
  Filled 2022-07-14: qty 90, 90d supply, fill #1

## 2022-02-13 MED ORDER — AMPHETAMINE-DEXTROAMPHET ER 15 MG PO CP24
ORAL_CAPSULE | ORAL | 0 refills | Status: DC
  Filled 2022-05-01: qty 30, 30d supply, fill #0

## 2022-02-14 ENCOUNTER — Ambulatory Visit: Payer: No Typology Code available for payment source | Admitting: Gastroenterology

## 2022-02-17 ENCOUNTER — Other Ambulatory Visit (HOSPITAL_COMMUNITY): Payer: Self-pay

## 2022-02-17 MED ORDER — EST ESTROGENS-METHYLTEST 0.625-1.25 MG PO TABS
ORAL_TABLET | ORAL | 1 refills | Status: AC
  Filled 2022-02-17 – 2022-04-05 (×2): qty 45, 90d supply, fill #0
  Filled 2022-07-01: qty 45, 90d supply, fill #1

## 2022-02-24 ENCOUNTER — Other Ambulatory Visit (HOSPITAL_COMMUNITY): Payer: Self-pay

## 2022-03-11 ENCOUNTER — Ambulatory Visit (INDEPENDENT_AMBULATORY_CARE_PROVIDER_SITE_OTHER): Payer: No Typology Code available for payment source | Admitting: Gastroenterology

## 2022-03-11 ENCOUNTER — Encounter: Payer: Self-pay | Admitting: Gastroenterology

## 2022-03-11 ENCOUNTER — Other Ambulatory Visit (HOSPITAL_COMMUNITY): Payer: Self-pay

## 2022-03-11 VITALS — BP 122/72 | HR 70 | Ht 66.0 in | Wt 162.5 lb

## 2022-03-11 DIAGNOSIS — K581 Irritable bowel syndrome with constipation: Secondary | ICD-10-CM

## 2022-03-11 MED ORDER — LUBIPROSTONE 24 MCG PO CAPS
24.0000 ug | ORAL_CAPSULE | Freq: Two times a day (BID) | ORAL | 3 refills | Status: DC
  Filled 2022-03-11: qty 60, 30d supply, fill #0

## 2022-03-11 NOTE — Progress Notes (Signed)
? ?       ? ?[name] [name]    [id number]    06/04/1966 ? ?Primary Care Physician:Scifres, Earlie Server, PA-C ? ?Referring Physician: Maude Leriche, PA-C ?[place] ?[place] ?[place],   [address] ? ? ?Chief complaint:  Constipation ? ?HPI: ?56 year old very pleasant female here for follow-up visit intermittent constipation alternating with diarrhea ?She is taking fiber Gummies twice a day but is struggling to have a bowel movement daily, usually during weekdays she has bowel movement once or twice and then over the weekend she has multiple bowel movements with diarrhea.  She is trying to avoid straining. ?She felt better for couple days after the bowel purge but constipation recurred. She feels nauseous when she has the urge to have a bowel movement or strains ?Denies any rectal bleeding, melena, vomiting, decreased appetite or unintentional weight loss. ?  ?Colonoscopy 09/23/21 ?- The perianal and digital rectal examinations were normal. ?- A 7 mm polyp was found in the ascending colon. The polyp was sessile. The polyp was ?removed with a cold snare. Resection and retrieval were complete. ?- Nonbleeding superficial linear ulcerated mucosa were present in the rectum. Biopsies were ?taken with a cold forceps for histology. ?- Non-bleeding external and internal hemorrhoids were found during retroflexion. The ?hemorrhoids were medium-sized. ?- The exam was otherwise without abnormality. ?  ?1. Surgical [P], colon, ascending, polyp (1) ?- HYPERPLASTIC POLYP ?- MULTIPLE STEP SECTIONS WERE EXAMINED ?2. Surgical [P], colon, rectum ?- MILDLY ACTIVE NONSPECIFIC PROCTITIS. SEE NOTE ?- NEGATIVE FOR FEATURES OF CHRONICITY OR GRANULOMAS ?Diagnosis Note ?2. Differential diagnosis can include drug-effect, infection, stercoral proctitis and ischemia. ? ? ?Outpatient Encounter Medications as of 03/11/2022  ?Medication Sig  ? acetaminophen (TYLENOL) 500 MG tablet Take 1,000 mg by mouth every 6 (six) hours as needed for  mild pain or headache.  ? amphetamine-dextroamphetamine (ADDERALL XR) 15 MG 24 hr capsule Take 1 capsule by mouth in the morning by mouth (30 day supply) *Please fill 30 days after last fill  ? amphetamine-dextroamphetamine (ADDERALL XR) 15 MG 24 hr capsule Take 1 capsule by mouth in the morning  (30 day supply) **Please fill 30 days after last fill  ? amphetamine-dextroamphetamine (ADDERALL XR) 15 MG 24 hr capsule Take 1 capsule by mouth in the morning (30 day supply) **Please fill 30 days after last fill  ? buPROPion (WELLBUTRIN XL) 300 MG 24 hr tablet Take 1 tablet by mouth once a day  ? estrogen-methylTESTOSTERone 0.625-1.25 MG tablet Take 1/2 tablet by mouth with food once a day for 90 days.  ? losartan-hydrochlorothiazide (HYZAAR) 50-12.5 MG tablet Take 1 tablet by mouth once a day  ? traZODone (DESYREL) 50 MG tablet Take 1 tablet by mouth at bedtime daily  ? [DISCONTINUED] amoxicillin-clavulanate (AUGMENTIN) 875-125 MG tablet Take 1 tablet by mouth 2 (two) times daily. For 5 days  ? [DISCONTINUED] buPROPion (WELLBUTRIN XL) 300 MG 24 hr tablet Take 1 tablet (300 mg total) by mouth daily.  ? [DISCONTINUED] losartan-hydrochlorothiazide (HYZAAR) 50-12.5 MG tablet Take 1 tablet by mouth daily  ? [DISCONTINUED] losartan-hydrochlorothiazide (HYZAAR) 50-12.5 MG tablet Take 1 tablet by mouth once a day  ? [DISCONTINUED] traZODone (DESYREL) 50 MG tablet Take 1 tablet (50 mg total) by mouth at bedtime.  ? [DISCONTINUED] traZODone (DESYREL) 50 MG tablet Take 1 tablet (50 mg total) by mouth at bedtime.  ? ?No facility-administered encounter medications on file as of 03/11/2022.  ? ? ?Allergies as of 03/11/2022  ? (No Known Allergies)  ? ? ?  Past Medical History:  ?Diagnosis Date  ? ADD (attention deficit disorder)   ? Anxiety   ? Cervical radiculopathy 2014  ? L arm (into L4th and 5th fingers)  ? Depression   ? Endometriosis   ? Hypertension   ? on meds  ? Interstitial cystitis   ? OA (osteoarthritis)   ? PONV  (postoperative nausea and vomiting)   ? Right knee meniscal tear   ? Wears glasses   ? ? ?Past Surgical History:  ?Procedure Laterality Date  ? BLADDER SURGERY    ? due to interstitial cystitis  ? BREAST REDUCTION SURGERY  1988  ? CHONDROPLASTY Right 05/11/2019  ? Procedure: CHONDROPLASTY;  Surgeon: Gaynelle Arabian, MD;  Location: [facility];  Service: Orthopedics;  Laterality: Right;  ? CLOSED MANIPULATION SHOULDER Right 11/15/2009  ? CYSTOSCOPY WITH HYDRODISTENSION AND BIOPSY  01-17-2004    dr Risa Grill  '@[facility]'$   ? for IC  ? DIAGNOSTIC LAPAROSCOPY  1988;  05-28-2006  ? KNEE ARTHROSCOPY Bilateral right 1996;  left 11-02-2008  ? KNEE ARTHROSCOPY Right 05/11/2019  ? Procedure: ARTHROSCOPY KNEE, MEDIAL MENISCAL DEBRIDEMENT;  Surgeon: Gaynelle Arabian, MD;  Location: [place];  Service: Orthopedics;  Laterality: Right;  ? KNEE ARTHROSCOPY WITH MEDIAL MENISECTOMY Right 08/13/2018  ? Procedure: RIGHT KNEE ARTHROSCOPY WITH DEBRIDEMENT chondroplasty;  Surgeon: Mcarthur Rossetti, MD;  Location: [facility];  Service: Orthopedics;  Laterality: Right;  ? REDUCTION MAMMAPLASTY    ? SHOULDER ARTHROSCOPY Bilateral right 08-07-2009;  let 04-17-2011  ? TOTAL ABDOMINAL HYSTERECTOMY W/ BILATERAL SALPINGOOPHORECTOMY  05-19-2007   dr Bing Matter  '@[facility]'$   ? ? ?Family History  ?Problem Relation Age of Onset  ? Colon polyps Mother   ? Heart disease Father   ? Stroke Father   ? Mental illness Father   ? Mental illness Sister   ? Stroke Brother   ? Diabetes Brother   ? Hyperlipidemia Brother   ? Colon cancer Maternal Uncle   ? Colon polyps Maternal Grandmother   ? Colon cancer Maternal Grandmother   ? Breast cancer Neg Hx   ? Stomach cancer Neg Hx   ? Rectal cancer Neg Hx   ? Esophageal cancer Neg Hx   ? ? ?Social History  ? ?Socioeconomic History  ? Marital status: Married  ?  Spouse name: Not on file  ? Number of children: 2  ? Years of education: Not on file  ? Highest education level: Not on file  ?Occupational  History  ? Not on file  ?Tobacco Use  ? Smoking status: Former  ?  Years: 4.00  ?  Types: Cigarettes  ?  Quit date: 08/04/1990  ?  Years since quitting: 31.6  ? Smokeless tobacco: Never  ?Vaping Use  ? Vaping Use: Never used  ?Substance and Sexual Activity  ? Alcohol use: Yes  ?  Alcohol/week: 2.0 standard drinks  ?  Types: 2 Standard drinks or equivalent per week  ?  Comment: rare  ? Drug use: No  ? Sexual activity: Yes  ?  Birth control/protection: Surgical  ?Other Topics Concern  ? Not on file  ?Social History Narrative  ? Not on file  ? ?Social Determinants of Health  ? ?Financial Resource Strain: Not on file  ?Food Insecurity: Not on file  ?Transportation Needs: Not on file  ?Physical Activity: Not on file  ?Stress: Not on file  ?Social Connections: Not on file  ?Intimate Partner Violence: Not on file  ? ? ? ? ?Review  of systems: ?All other review of systems negative except as mentioned in the HPI. ? ? ?Physical Exam: ?Vitals:  ? 03/11/22 1557  ?BP: 122/72  ?Pulse: 70  ?SpO2: 98%  ? ?Body mass index is 26.23 kg/m?. ?Gen:      No acute distress ?HEENT:  sclera anicteric ?Abd:      soft, non-tender; no palpable masses, no distension ?Ext:    No edema ?Neuro: alert and oriented x 3 ?Psych: normal mood and affect ? ?Data Reviewed: ? ?Reviewed labs, radiology imaging, old records and pertinent past GI work up ? ? ?Assessment and Plan/Recommendations: ? ?56 year old very pleasant female with worsening constipation alternating with diarrhea, her symptoms are likely secondary to irritable bowel syndrome  ?She is up-to-date with colorectal cancer screening ? ?Continue high-fiber diet with increased water intake 8 to 10 cups daily ?Continue fiber Gummies twice daily, titrate up as tolerated ?Add stool softener, docusate daily at bedtime ?If no improvement in bowel habits in 1 week, advised patient to pick up the prescription for Amitiza 24 mcg twice daily ? ?Nonspecific proctitis based on colonoscopy and biopsies, will  continue to monitor.  She currently has no rectal bleeding. ?  ?Return in 3 months. ? ?This visit required 30 minutes of patient care (this includes precharting, chart review, review of results, face-to-face time used for c

## 2022-03-11 NOTE — Patient Instructions (Signed)
We have sent the following medications to your pharmacy for you to pick up at your convenience:   Amitiza 24 mcg sent to your pharmacy ? ?Use a stool softener colace OTC daily ? ?Increase dietary fiber and water for 1 week  ? ?Follow up in 3 months ? ?If you are age 53 or older, your body mass index should be between 23-30. Your Body mass index is 26.23 kg/m?Marland Kitchen If this is out of the aforementioned range listed, please consider follow up with your Primary Care Provider. ? ?If you are age 75 or younger, your body mass index should be between 19-25. Your Body mass index is 26.23 kg/m?Marland Kitchen If this is out of the aformentioned range listed, please consider follow up with your Primary Care Provider.  ? ?________________________________________________________ ? ?The [place] providers would like to encourage you to use [facility] to communicate with providers for non-urgent requests or questions.  Due to long hold times on the telephone, sending your provider a message by [facility] may be a faster and more efficient way to get a response.  Please allow 48 business hours for a response.  Please remember that this is for non-urgent requests.  ?_______________________________________________________  ? ?I appreciate the  opportunity to care for you ? ?Thank You  ? ?Harl Bowie , MD   ?

## 2022-03-18 ENCOUNTER — Ambulatory Visit: Payer: No Typology Code available for payment source | Admitting: Physician Assistant

## 2022-03-27 ENCOUNTER — Encounter: Payer: Self-pay | Admitting: Gastroenterology

## 2022-03-28 ENCOUNTER — Telehealth: Payer: Self-pay

## 2022-03-28 NOTE — Telephone Encounter (Signed)
Called patient to discuss the patient messages about her back pain. ?The patient is complaining of left lower back pain with some radiation into the left lower abdomen. She describes it as an ache dull in nature. If she bends or moves it will be sharp. 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 This pain reminds her of that. She does not typically take any NSAIDS or Tylenol due to her hepatosis and elevated kidney functions. She will consider warmth to the area to see if this provides relief.  ?She continues to have issues with her bowel movements. She has taken Amitiza 2 days. She found that it caused her to have frequent bowel movements. She is hesitant to try it again. Discuss the expected action of Amitiza. She will try it again over the weekend. She has increased the fiber gummies to 3 a day. Her bowel movements are soft unformed and difficult to clean well afterwards. We discussed this as well. The patient will let me know if the back pain continues. Is there anything else she should try for the bowel movement issue? ?

## 2022-03-31 ENCOUNTER — Other Ambulatory Visit (HOSPITAL_COMMUNITY): Payer: Self-pay

## 2022-03-31 NOTE — Telephone Encounter (Signed)
Stop Amitiza.  Continue with fiber.  Add Colace 1-2 daily at bedtime.  Back pain most likely musculoskeletal.  If persistent can benefit from referral to sports medicine.  Thank you ?

## 2022-04-01 NOTE — Telephone Encounter (Signed)
Spoke with pt and she is aware of Dr. Woodward Ku recommendations. ?

## 2022-04-05 ENCOUNTER — Other Ambulatory Visit (HOSPITAL_COMMUNITY): Payer: Self-pay

## 2022-04-21 ENCOUNTER — Other Ambulatory Visit (HOSPITAL_COMMUNITY): Payer: Self-pay

## 2022-04-28 ENCOUNTER — Other Ambulatory Visit (HOSPITAL_COMMUNITY): Payer: Self-pay

## 2022-05-01 ENCOUNTER — Other Ambulatory Visit (HOSPITAL_COMMUNITY): Payer: Self-pay

## 2022-05-05 ENCOUNTER — Other Ambulatory Visit (HOSPITAL_COMMUNITY): Payer: Self-pay

## 2022-05-14 ENCOUNTER — Encounter: Payer: Self-pay | Admitting: Gastroenterology

## 2022-05-23 ENCOUNTER — Other Ambulatory Visit: Payer: Self-pay | Admitting: Physician Assistant

## 2022-05-23 DIAGNOSIS — R1032 Left lower quadrant pain: Secondary | ICD-10-CM

## 2022-06-02 ENCOUNTER — Other Ambulatory Visit (HOSPITAL_COMMUNITY): Payer: Self-pay

## 2022-06-02 MED ORDER — AMPHETAMINE-DEXTROAMPHET ER 15 MG PO CP24
ORAL_CAPSULE | ORAL | 0 refills | Status: DC
  Filled 2022-06-02: qty 30, 30d supply, fill #0

## 2022-06-03 ENCOUNTER — Ambulatory Visit (INDEPENDENT_AMBULATORY_CARE_PROVIDER_SITE_OTHER): Payer: No Typology Code available for payment source | Admitting: Gastroenterology

## 2022-06-03 ENCOUNTER — Encounter: Payer: Self-pay | Admitting: Gastroenterology

## 2022-06-03 VITALS — BP 124/78 | HR 76 | Ht 66.0 in | Wt 163.4 lb

## 2022-06-03 DIAGNOSIS — K5902 Outlet dysfunction constipation: Secondary | ICD-10-CM | POA: Diagnosis not present

## 2022-06-03 DIAGNOSIS — M6289 Other specified disorders of muscle: Secondary | ICD-10-CM

## 2022-06-03 DIAGNOSIS — R1032 Left lower quadrant pain: Secondary | ICD-10-CM

## 2022-06-03 NOTE — Patient Instructions (Signed)
Continue over the counter Colace 1 tablet by mouth every other day.  You will be referred to pelvic floor therapy for biofeedback. They will contact you directly with an appt date and time.   Dr. Silverio Decamp will review your CT scan that was ordered by your PCP on Tuesday.   The [place] [facility] providers would like to encourage you to use [facility] to communicate with providers for non-urgent requests or questions.  Due to long hold times on the telephone, sending your provider a message by [facility] may be a faster and more efficient way to get a response.  Please allow 48 business hours for a response.  Please remember that this is for non-urgent requests.

## 2022-06-03 NOTE — Progress Notes (Signed)
[name] [name]    [id number]    Oct 10, 1966  Primary Care Physician:Scifres, Durel Salts (Inactive)  Referring Physician: Scifres, Earlie Server, PA-C No address on file   Chief complaint: Alternating constipation and diarrhea, left lower quadrant abdominal pain  HPI:  56 year old very pleasant female here for follow-up visit intermittent constipation alternating with diarrhea She is taking fiber Gummies twice a day but is struggling to have a bowel movement daily, usually during weekdays she has bowel movement once or twice and then over the weekend she has multiple bowel movements with diarrhea.  She is trying to avoid straining.  She is taking Colace once or twice a week, is not taking Amitiza as it causes diarrhea. She felt better for couple days after the bowel purge but constipation recurred. She feels nauseous when she has the urge to have a bowel movement or strains Denies any rectal bleeding, melena, vomiting, decreased appetite or unintentional weight loss.   Colonoscopy 09/23/21 - The perianal and digital rectal examinations were normal. - A 7 mm polyp was found in the ascending colon. The polyp was sessile. The polyp was removed with a cold snare. Resection and retrieval were complete. - Nonbleeding superficial linear ulcerated mucosa were present in the rectum. Biopsies were taken with a cold forceps for histology. - Non-bleeding external and internal hemorrhoids were found during retroflexion. The hemorrhoids were medium-sized. - The exam was otherwise without abnormality.   1. Surgical [P], colon, ascending, polyp (1) - HYPERPLASTIC POLYP - MULTIPLE STEP SECTIONS WERE EXAMINED 2. Surgical [P], colon, rectum - MILDLY ACTIVE NONSPECIFIC PROCTITIS. SEE NOTE - NEGATIVE FOR FEATURES OF CHRONICITY OR GRANULOMAS Diagnosis Note 2. Differential diagnosis can include drug-effect, infection, stercoral proctitis and ischemia.     Outpatient Encounter Medications  as of 06/03/2022  Medication Sig   acetaminophen (TYLENOL) 500 MG tablet Take 1,000 mg by mouth every 6 (six) hours as needed for mild pain or headache.   amphetamine-dextroamphetamine (ADDERALL XR) 15 MG 24 hr capsule Take 1 capsule by mouth in the morning by mouth (30 day supply) *Please fill 30 days after last fill   amphetamine-dextroamphetamine (ADDERALL XR) 15 MG 24 hr capsule Take 1 capsule by mouth in the morning  (30 day supply) **Please fill 30 days after last fill   amphetamine-dextroamphetamine (ADDERALL XR) 15 MG 24 hr capsule Take 1 capsule by mouth in the morning (30 day supply) **Please fill 30 days after last fill   amphetamine-dextroamphetamine (ADDERALL XR) 15 MG 24 hr capsule Take 1 capsule by mouth once in the morning   buPROPion (WELLBUTRIN XL) 300 MG 24 hr tablet Take 1 tablet by mouth once a day   estrogen-methylTESTOSTERone 0.625-1.25 MG tablet Take 1/2 tablet by mouth with food once a day for 90 days.   losartan-hydrochlorothiazide (HYZAAR) 50-12.5 MG tablet Take 1 tablet by mouth once a day   lubiprostone (AMITIZA) 24 MCG capsule Take 1 capsule (24 mcg total) by mouth 2 (two) times daily with a meal.   traZODone (DESYREL) 50 MG tablet Take 1 tablet by mouth at bedtime daily   No facility-administered encounter medications on file as of 06/03/2022.    Allergies as of 06/03/2022   (No Known Allergies)    Past Medical History:  Diagnosis Date   ADD (attention deficit disorder)    Anxiety    Cervical radiculopathy 2014   L arm (into L4th and 5th fingers)   Depression    Endometriosis  Hypertension    on meds   Interstitial cystitis    OA (osteoarthritis)    PONV (postoperative nausea and vomiting)    Right knee meniscal tear    Wears glasses     Past Surgical History:  Procedure Laterality Date   BLADDER SURGERY     due to interstitial cystitis   BREAST REDUCTION SURGERY  1988   CHONDROPLASTY Right 05/11/2019   Procedure: CHONDROPLASTY;  Surgeon:  Gaynelle Arabian, MD;  Location: [place];  Service: Orthopedics;  Laterality: Right;   CLOSED MANIPULATION SHOULDER Right 11/15/2009   CYSTOSCOPY WITH HYDRODISTENSION AND BIOPSY  01-17-2004    dr Risa Grill  '@[facility]'$    for [place];  05-28-2006   KNEE ARTHROSCOPY Bilateral right 1996;  left 11-02-2008   KNEE ARTHROSCOPY Right 05/11/2019   Procedure: ARTHROSCOPY KNEE, MEDIAL MENISCAL DEBRIDEMENT;  Surgeon: Gaynelle Arabian, MD;  Location: [place];  Service: Orthopedics;  Laterality: Right;   KNEE ARTHROSCOPY WITH MEDIAL MENISECTOMY Right 08/13/2018   Procedure: RIGHT KNEE ARTHROSCOPY WITH DEBRIDEMENT chondroplasty;  Surgeon: Mcarthur Rossetti, MD;  Location: [facility];  Service: Orthopedics;  Laterality: Right;   REDUCTION MAMMAPLASTY     SHOULDER ARTHROSCOPY Bilateral right 08-07-2009;  let 04-17-2011   TOTAL ABDOMINAL HYSTERECTOMY W/ BILATERAL SALPINGOOPHORECTOMY  05-19-2007   dr Bing Matter  '@[facility]'$     Family History  Problem Relation Age of Onset   Colon polyps Mother    Heart disease Father    Stroke Father    Mental illness Father    Mental illness Sister    Stroke Brother    Diabetes Brother    Hyperlipidemia Brother    Colon cancer Maternal Uncle    Colon polyps Maternal Grandmother    Colon cancer Maternal Grandmother    Breast cancer Neg Hx    Stomach cancer Neg Hx    Rectal cancer Neg Hx    Esophageal cancer Neg Hx     Social History   Socioeconomic History   Marital status: Married    Spouse name: Not on file   Number of children: 2   Years of education: Not on file   Highest education level: Not on file  Occupational History   Not on file  Tobacco Use   Smoking status: Former    Years: 4.00    Types: Cigarettes    Quit date: 08/04/1990    Years since quitting: 31.8   Smokeless tobacco: Never  Vaping Use   Vaping Use: Never used  Substance and Sexual Activity   Alcohol use: Yes    Alcohol/week: 2.0  standard drinks of alcohol    Types: 2 Standard drinks or equivalent per week    Comment: rare   Drug use: No   Sexual activity: Yes    Birth control/protection: Surgical  Other Topics Concern   Not on file  Social History Narrative   Not on file   Social Determinants of Health   Financial Resource Strain: Not on file  Food Insecurity: Not on file  Transportation Needs: Not on file  Physical Activity: Not on file  Stress: Not on file  Social Connections: Not on file  Intimate Partner Violence: Not on file      Review of systems: All other review of systems negative except as mentioned in the HPI.   Physical Exam: Vitals:   06/03/22 1454  BP: 124/78  Pulse: 76  SpO2: 97%   Body mass index is 26.37  kg/m. Gen:      No acute distress HEENT:  sclera anicteric Abd:      soft, non-tender; no palpable masses, no distension Ext:    No edema Neuro: alert and oriented x 3 Psych: normal mood and affect  Data Reviewed:  Reviewed labs, radiology imaging, old records and pertinent past GI work up   Assessment and Plan/Recommendations:  56 year old very pleasant female with worsening constipation alternating with diarrhea, her symptoms are likely secondary to irritable bowel syndrome  She is up-to-date with colorectal cancer screening   Continue high-fiber diet with increased water intake 8 to 10 cups daily Continue fiber Gummies twice daily, titrate up as tolerated Add stool softener, docusate every other day at bedtime, if continues to have constipation increase with 2 daily  Refer to pelvic floor physical therapy for biofeedback, she has dyssynergic defecation with pelvic floor dysfunction  Nonspecific proctitis based on colonoscopy and biopsies, will continue to monitor, likely secondary to stool impaction stercoral proctitis.  She currently has no rectal bleeding.  She has CT abdomen pelvis ordered by PMD for next week, will review it to exclude any acute GI  pathology.  We will hold off repeat endoscopic evaluation if symptoms improve and CT is negative for any acute pathology.   Return in 2 months.  This visit required 31 minutes of patient care (this includes precharting, chart review, review of results, face-to-face time used for counseling as well as treatment plan and follow-up. The patient was provided an opportunity to ask questions and all were answered. The patient agreed with the plan and demonstrated an understanding of the instructions.  Damaris Hippo , MD    CC: Scifres, Earlie Server, PA-C

## 2022-06-10 ENCOUNTER — Ambulatory Visit
Admission: RE | Admit: 2022-06-10 | Discharge: 2022-06-10 | Disposition: A | Discharge status: Home / Self Care | Payer: No Typology Code available for payment source | Source: Ambulatory Visit | Attending: Physician Assistant | Admitting: Physician Assistant

## 2022-06-10 DIAGNOSIS — R1032 Left lower quadrant pain: Secondary | ICD-10-CM

## 2022-06-11 ENCOUNTER — Encounter: Payer: Self-pay | Admitting: Gastroenterology

## 2022-06-12 ENCOUNTER — Encounter: Payer: Self-pay | Admitting: Gastroenterology

## 2022-06-13 ENCOUNTER — Ambulatory Visit: Payer: No Typology Code available for payment source | Admitting: Gastroenterology

## 2022-06-24 ENCOUNTER — Other Ambulatory Visit (HOSPITAL_COMMUNITY): Payer: Self-pay

## 2022-06-25 ENCOUNTER — Other Ambulatory Visit: Payer: Self-pay

## 2022-06-25 ENCOUNTER — Encounter: Payer: Self-pay | Admitting: Physical Therapy

## 2022-06-25 ENCOUNTER — Other Ambulatory Visit (HOSPITAL_COMMUNITY): Payer: Self-pay

## 2022-06-25 ENCOUNTER — Ambulatory Visit: Payer: No Typology Code available for payment source | Attending: Gastroenterology | Admitting: Physical Therapy

## 2022-06-25 DIAGNOSIS — R1032 Left lower quadrant pain: Secondary | ICD-10-CM | POA: Diagnosis present

## 2022-06-25 DIAGNOSIS — M6281 Muscle weakness (generalized): Secondary | ICD-10-CM | POA: Diagnosis not present

## 2022-06-25 DIAGNOSIS — R279 Unspecified lack of coordination: Secondary | ICD-10-CM | POA: Insufficient documentation

## 2022-06-25 DIAGNOSIS — R293 Abnormal posture: Secondary | ICD-10-CM | POA: Insufficient documentation

## 2022-06-25 DIAGNOSIS — M6289 Other specified disorders of muscle: Secondary | ICD-10-CM | POA: Insufficient documentation

## 2022-06-25 DIAGNOSIS — M62838 Other muscle spasm: Secondary | ICD-10-CM | POA: Diagnosis not present

## 2022-06-25 DIAGNOSIS — K5902 Outlet dysfunction constipation: Secondary | ICD-10-CM | POA: Diagnosis not present

## 2022-06-25 NOTE — Patient Instructions (Signed)
    **  Balloon Breathing for when you feel the need to strain when having a bowel movement. Bring an open fist to mouth and blow out like blowing up a balloon.

## 2022-06-25 NOTE — Therapy (Addendum)
OUTPATIENT PHYSICAL THERAPY FEMALE PELVIC EVALUATION   Patient Name: [name] [name] MRN: [id number] DOB:18-Jan-1966, 56 y.o., female Today's Date: 06/25/2022   PT End of Session - 06/25/22 0844     Visit Number 1    Date for PT Re-Evaluation 09/25/22    Authorization Type [facility]    PT Start Time 0800    PT Stop Time 0840    PT Time Calculation (min) 40 min    Activity Tolerance Patient tolerated treatment well    Behavior During Therapy [facility] for tasks assessed/performed             Past Medical History:  Diagnosis Date   ADD (attention deficit disorder)    Anxiety    Cervical radiculopathy 2014   L arm (into L4th and 5th fingers)   Depression    Endometriosis    Hypertension    on meds   Interstitial cystitis    OA (osteoarthritis)    PONV (postoperative nausea and vomiting)    Right knee meniscal tear    Wears glasses    Past Surgical History:  Procedure Laterality Date   BLADDER SURGERY     due to interstitial cystitis   BREAST REDUCTION SURGERY  1988   CHONDROPLASTY Right 05/11/2019   Procedure: CHONDROPLASTY;  Surgeon: Gaynelle Arabian, MD;  Location: [place];  Service: Orthopedics;  Laterality: Right;   CLOSED MANIPULATION SHOULDER Right 11/15/2009   CYSTOSCOPY WITH HYDRODISTENSION AND BIOPSY  01-17-2004    dr Risa Grill  _0    for [place];  05-28-2006   KNEE ARTHROSCOPY Bilateral right 1996;  left 11-02-2008   KNEE ARTHROSCOPY Right 05/11/2019   Procedure: ARTHROSCOPY KNEE, MEDIAL MENISCAL DEBRIDEMENT;  Surgeon: Gaynelle Arabian, MD;  Location: [place];  Service: Orthopedics;  Laterality: Right;   KNEE ARTHROSCOPY WITH MEDIAL MENISECTOMY Right 08/13/2018   Procedure: RIGHT KNEE ARTHROSCOPY WITH DEBRIDEMENT chondroplasty;  Surgeon: Mcarthur Rossetti, MD;  Location: [facility];  Service: Orthopedics;  Laterality: Right;   REDUCTION MAMMAPLASTY     SHOULDER ARTHROSCOPY Bilateral right 08-07-2009;   let 04-17-2011   TOTAL ABDOMINAL HYSTERECTOMY W/ BILATERAL SALPINGOOPHORECTOMY  05-19-2007   dr Bing Matter  _1    Patient Active Problem List   Diagnosis Date Noted   Derangement of posterior horn of medial meniscus due to old tear or injury, right knee 05/13/2018   Chronic pain of right knee 03/15/2018   Chronic pain of left knee 03/15/2018   Unilateral primary osteoarthritis, left knee 03/15/2018   Unilateral primary osteoarthritis, right knee 03/15/2018   H/O shoulder surgery    Constipation 11/28/2015   Family history of colon cancer 11/28/2015   Rectal bleeding 11/28/2015   Low back pain 11/28/2015   Dysthymia 08/04/2012   ADD (attention deficit disorder) 08/04/2012   Sleep disturbance 08/04/2012   IC (interstitial cystitis) 08/04/2012   H/O hysterectomy for benign disease 04/07/2012    PCP: Scifres, Earlie Server, PA-C  REFERRING PROVIDER: Mauri Pole, MD  REFERRING DIAG: [phone] (ICD-10-CM) - Constipation due to outlet dysfunction M62.89 (ICD-10-CM) - Pelvic floor dysfunction R10.32 (ICD-10-CM) - LLQ abdominal pain  THERAPY DIAG:  Muscle weakness (generalized)  Abnormal posture  Unspecified lack of coordination  Other muscle spasm  Rationale for Evaluation and Treatment Rehabilitation  ONSET DATE: 1 year  SUBJECTIVE:  SUBJECTIVE STATEMENT: Pt reports she has constipation problems in the last year, now usually emptying every 2 days. Usually formed but if she hasn't gone in a few days will take laxative and then stools are soft. Pt take fiber gummies daily. Does have IC and knows her irritants.   Fluid intake: Yes: unsure but trying to drink a lot, at least one full Yeti per day. Doesn't drink anything else.      PAIN:  Are you having pain? No does have pressure that feels like  constipation but no pain.   PRECAUTIONS: None  WEIGHT BEARING RESTRICTIONS No  FALLS:  Has patient fallen in last 6 months? No  LIVING ENVIRONMENT: Lives with: lives with their spouse Lives in: House/apartment   OCCUPATION: works at [employer]  PLOF: [place] to have more regular bowels  PERTINENT HISTORY:  Hysterectomy, 2x vaginal births, IC  Sexual abuse: No  BOWEL MOVEMENT Pain with bowel movement: No - unless pushing then it is painful Type of bowel movement:Type (Bristol Stool Scale) 2-6, Frequency every 3 days to daily, and Strain Yes Fully empty rectum: Yes:   Leakage: No Pads: No Fiber supplement: Yes: fiber gummies daily  URINATION Pain with urination: No Fully empty bladder: Yes:   Stream: Strong Urgency: Yes:   Frequency: every 2 hours Leakage:  none  Pads: No  INTERCOURSE Pain with intercourse:  not painful Ability to have vaginal penetration:  Yes:   Climax: not painful Marinoff Scale: 0/3 Denies vaginal dryness - does take oral hormones as well.  PREGNANCY Vaginal deliveries 2 Tearing Yes: tearing with both C-section deliveries 0 Currently pregnant No  PROLAPSE None    OBJECTIVE:   DIAGNOSTIC FINDINGS:    COGNITION:  Overall cognitive status: Within functional limits for tasks assessed     SENSATION:  Light touch: Appears intact  Proprioception: Appears intact  MUSCLE LENGTH: Bil hamstrings and adductors limited by 25%                POSTURE: rounded shoulders   PELVIC ALIGNMENT:  LUMBARAROM/PROM  WFL  LOWER EXTREMITY ROM:  WFL  LOWER EXTREMITY MMT:  Bil hips 4+/5 grossly; knees and ankles 5/5   PALPATION:   General  TTP at upper middle abdominal quadrant; fascial restrictions throughout but without pain                External Perineal Exam pt deferred                             Internal Pelvic Floor pt deferred   Patient confirms identification and approves PT to assess internal  pelvic floor and treatment No  PELVIC MMT:   MMT eval  Vaginal   Internal Anal Sphincter   External Anal Sphincter   Puborectalis   Diastasis Recti   (Blank rows = not tested)        TONE: Pt deferred  PROLAPSE: Pt deferred  TODAY'S TREATMENT  EVAL Examination completed, findings reviewed, pt educated on POC, abdominal massage, voiding mechanics, and balloon breathing technique. Pt motivated to participate in PT and agreeable to attempt recommendations.     PATIENT EDUCATION:  Education details: abdominal massage, voiding mechanics, and balloon breathing technique Person educated: Patient Education method: Explanation, Demonstration, Tactile cues, Verbal cues, and Handouts Education comprehension: verbalized understanding and returned demonstration   HOME EXERCISE PROGRAM: abdominal massage, voiding mechanics, and balloon breathing technique  ASSESSMENT:  CLINICAL  IMPRESSION: Patient is a 56 y.o. female  who was seen today for physical therapy evaluation and treatment for chronic constipation. Pt found to have fascial restrictions in abdomen throughout, does have [facility]TP at [facility]. Pt deferred internal assessment today, does report she feels constipated a lot of the time with nausea and cramping when unable to pass stool. Pt usually empties every other day but frequently needs to strain but has pain with this. Pt educated on pelvic floor therapy, rectal exam, exam findings today, abdominal massage, breathing and voiding mechanics to improve bowel habits. Pt denied additional questions and would like to call to schedule follow up appointment. Pt would benefit from additional PT to further address deficits.     OBJECTIVE IMPAIRMENTS decreased coordination, decreased endurance, decreased strength, increased fascial restrictions, increased muscle spasms, impaired flexibility, improper body mechanics, and postural dysfunction.   ACTIVITY LIMITATIONS continence  PARTICIPATION  LIMITATIONS: community activity  PERSONAL FACTORS Time since onset of injury/illness/exacerbation are also affecting patient's functional outcome.   REHAB POTENTIAL: Good  CLINICAL DECISION MAKING: Stable/uncomplicated  EVALUATION COMPLEXITY: Low   GOALS: Goals reviewed with patient? Yes  SHORT TERM GOALS: Target date: 07/23/2022  Pt to be I with HEP.  Baseline: Goal status: INITIAL  2.  Pt will report her BMs are complete due to improved bowel habits and evacuation techniques.  Baseline:  Goal status: INITIAL  3.  Pt to be I with abdominal massage and voiding mechanics to have improved bowel habits. Baseline:  Goal status: INITIAL   LONG TERM GOALS: Target date:  09/25/22    Pt to be I with advanced HEP.  Baseline:  Goal status: INITIAL  2.  Pt will report at least 4 BMs per week due to improved muscle tone and coordination with BMs.  Baseline:  Goal status: INITIAL  3.  Pt to report decreased need to strain by at least 75% to decrease strain at pelvic floor.  Baseline:  Goal status: INITIAL    PLAN: PT FREQUENCY:  every other week  PT DURATION:  4 sessions  PLANNED INTERVENTIONS: Therapeutic exercises, Therapeutic activity, Neuromuscular re-education, Patient/Family education, Joint mobilization, Dry Needling, Spinal mobilization, Cryotherapy, Moist heat, Manual lymph drainage, Taping, Biofeedback, and Manual therapy  PLAN FOR NEXT SESSION: internal rectal assessment as needed and pt consents; abdominal massage   Stacy Gardner, PT, DPT 07/12/238:54 AM    PHYSICAL THERAPY DISCHARGE SUMMARY  Visits from Start of Care: 1  Current functional level related to goals / functional outcomes: 1   Remaining deficits: Pt reports she has been doing much better and feels more regular, requesting DC after eval and has not had additional treatments.    Education / Equipment: HEP   Patient agrees to discharge. Patient goals were partially met. Patient is being  discharged due to being pleased with the current functional level.   Stacy Gardner, PT, DPT 07/31/231:59 PM

## 2022-07-01 ENCOUNTER — Other Ambulatory Visit (HOSPITAL_COMMUNITY): Payer: Self-pay

## 2022-07-02 ENCOUNTER — Other Ambulatory Visit (HOSPITAL_COMMUNITY): Payer: Self-pay

## 2022-07-02 MED ORDER — AMPHETAMINE-DEXTROAMPHET ER 15 MG PO CP24
ORAL_CAPSULE | ORAL | 0 refills | Status: DC
  Filled 2022-07-02: qty 30, 30d supply, fill #0

## 2022-07-03 ENCOUNTER — Other Ambulatory Visit (HOSPITAL_COMMUNITY): Payer: Self-pay

## 2022-07-14 ENCOUNTER — Other Ambulatory Visit (HOSPITAL_COMMUNITY): Payer: Self-pay

## 2022-07-15 ENCOUNTER — Encounter: Payer: Self-pay | Admitting: Physician Assistant

## 2022-07-21 ENCOUNTER — Other Ambulatory Visit (HOSPITAL_COMMUNITY): Payer: Self-pay

## 2022-08-01 ENCOUNTER — Other Ambulatory Visit (HOSPITAL_COMMUNITY): Payer: Self-pay

## 2022-08-01 MED ORDER — AMPHETAMINE-DEXTROAMPHET ER 15 MG PO CP24
ORAL_CAPSULE | ORAL | 0 refills | Status: DC
Start: 2022-08-01 — End: 2022-08-05
  Filled 2022-08-01: qty 30, 30d supply, fill #0

## 2022-08-04 ENCOUNTER — Other Ambulatory Visit (HOSPITAL_COMMUNITY): Payer: Self-pay

## 2022-08-04 ENCOUNTER — Telehealth: Payer: Self-pay | Admitting: Gastroenterology

## 2022-08-04 NOTE — Telephone Encounter (Signed)
Patient called said she is doing well since seeing the physical therapist she was referred to by Dr. Silverio Decamp and canceled the follow up appt.

## 2022-08-05 ENCOUNTER — Other Ambulatory Visit (HOSPITAL_COMMUNITY): Payer: Self-pay

## 2022-08-05 MED ORDER — AMPHETAMINE-DEXTROAMPHET ER 15 MG PO CP24
15.0000 mg | ORAL_CAPSULE | ORAL | 0 refills | Status: DC
  Filled 2022-10-06: qty 30, 30d supply, fill #0

## 2022-08-05 MED ORDER — AMPHETAMINE-DEXTROAMPHET ER 15 MG PO CP24
ORAL_CAPSULE | ORAL | 0 refills | Status: DC
  Filled 2022-09-02: qty 30, 30d supply, fill #0

## 2022-08-05 MED ORDER — AMPHETAMINE-DEXTROAMPHET ER 15 MG PO CP24
15.0000 mg | ORAL_CAPSULE | Freq: Every morning | ORAL | 0 refills | Status: AC
  Filled 2022-12-09: qty 30, 30d supply, fill #0

## 2022-08-07 ENCOUNTER — Ambulatory Visit: Payer: No Typology Code available for payment source | Admitting: Gastroenterology

## 2022-09-02 ENCOUNTER — Other Ambulatory Visit (HOSPITAL_COMMUNITY): Payer: Self-pay

## 2022-09-17 ENCOUNTER — Ambulatory Visit: Payer: No Typology Code available for payment source | Admitting: Physician Assistant

## 2022-09-18 ENCOUNTER — Other Ambulatory Visit: Payer: Self-pay | Admitting: Physician Assistant

## 2022-09-18 DIAGNOSIS — Z1231 Encounter for screening mammogram for malignant neoplasm of breast: Secondary | ICD-10-CM

## 2022-10-01 ENCOUNTER — Other Ambulatory Visit (HOSPITAL_COMMUNITY): Payer: Self-pay

## 2022-10-02 ENCOUNTER — Other Ambulatory Visit (HOSPITAL_COMMUNITY): Payer: Self-pay

## 2022-10-02 MED ORDER — EST ESTROGENS-METHYLTEST 0.625-1.25 MG PO TABS
ORAL_TABLET | ORAL | 0 refills | Status: AC
  Filled 2022-10-02: qty 45, 90d supply, fill #0

## 2022-10-03 ENCOUNTER — Other Ambulatory Visit (HOSPITAL_COMMUNITY): Payer: Self-pay

## 2022-10-06 ENCOUNTER — Other Ambulatory Visit (HOSPITAL_COMMUNITY): Payer: Self-pay

## 2022-10-13 ENCOUNTER — Other Ambulatory Visit (HOSPITAL_COMMUNITY): Payer: Self-pay

## 2022-10-14 ENCOUNTER — Other Ambulatory Visit (HOSPITAL_COMMUNITY): Payer: Self-pay

## 2022-10-14 MED ORDER — TRAZODONE HCL 50 MG PO TABS
50.0000 mg | ORAL_TABLET | Freq: Every day | ORAL | 0 refills | Status: DC
  Filled 2022-10-14: qty 90, 90d supply, fill #0

## 2022-10-20 ENCOUNTER — Other Ambulatory Visit (HOSPITAL_COMMUNITY): Payer: Self-pay

## 2022-10-21 ENCOUNTER — Other Ambulatory Visit (HOSPITAL_COMMUNITY): Payer: Self-pay

## 2022-10-21 MED ORDER — LOSARTAN POTASSIUM-HCTZ 50-12.5 MG PO TABS
1.0000 | ORAL_TABLET | Freq: Every day | ORAL | 0 refills | Status: DC
  Filled 2022-10-21: qty 90, 90d supply, fill #0

## 2022-10-28 ENCOUNTER — Other Ambulatory Visit (HOSPITAL_COMMUNITY): Payer: Self-pay

## 2022-10-28 MED ORDER — FLUTICASONE PROPIONATE 50 MCG/ACT NA SUSP
1.0000 | Freq: Every day | NASAL | 1 refills | Status: DC
  Filled 2022-10-28: qty 16, 60d supply, fill #0

## 2022-10-28 MED ORDER — CYCLOBENZAPRINE HCL 10 MG PO TABS
10.0000 mg | ORAL_TABLET | Freq: Every evening | ORAL | 0 refills | Status: DC | PRN
  Filled 2022-10-28: qty 14, 14d supply, fill #0

## 2022-10-28 MED ORDER — DOXYCYCLINE HYCLATE 100 MG PO CAPS
100.0000 mg | ORAL_CAPSULE | Freq: Two times a day (BID) | ORAL | 0 refills | Status: DC
  Filled 2022-10-28: qty 14, 7d supply, fill #0

## 2022-11-03 ENCOUNTER — Other Ambulatory Visit (HOSPITAL_COMMUNITY): Payer: Self-pay

## 2022-11-03 MED ORDER — AMPHETAMINE-DEXTROAMPHET ER 15 MG PO CP24
15.0000 mg | ORAL_CAPSULE | Freq: Every morning | ORAL | 0 refills | Status: AC
  Filled 2022-11-04: qty 30, 30d supply, fill #0

## 2022-11-03 MED ORDER — AMPHETAMINE-DEXTROAMPHET ER 15 MG PO CP24
15.0000 mg | ORAL_CAPSULE | Freq: Every morning | ORAL | 0 refills | Status: AC
  Filled 2023-01-12: qty 30, 30d supply, fill #0

## 2022-11-03 MED ORDER — AMPHETAMINE-DEXTROAMPHET ER 15 MG PO CP24: 15.0000 mg | ORAL_CAPSULE | Freq: Every morning | ORAL | 0 refills | Status: AC

## 2022-11-04 ENCOUNTER — Other Ambulatory Visit (HOSPITAL_COMMUNITY): Payer: Self-pay

## 2022-11-05 ENCOUNTER — Other Ambulatory Visit (HOSPITAL_COMMUNITY): Payer: Self-pay

## 2022-11-05 ENCOUNTER — Ambulatory Visit
Admission: RE | Admit: 2022-11-05 | Discharge: 2022-11-05 | Disposition: A | Discharge status: Home / Self Care | Payer: No Typology Code available for payment source | Source: Ambulatory Visit

## 2022-11-05 DIAGNOSIS — Z1231 Encounter for screening mammogram for malignant neoplasm of breast: Secondary | ICD-10-CM

## 2022-11-05 MED ORDER — BUPROPION HCL ER (XL) 300 MG PO TB24
ORAL_TABLET | ORAL | 1 refills | Status: AC
  Filled 2022-11-05: qty 90, 90d supply, fill #0

## 2022-12-09 ENCOUNTER — Other Ambulatory Visit: Payer: Self-pay

## 2022-12-09 ENCOUNTER — Other Ambulatory Visit (HOSPITAL_COMMUNITY): Payer: Self-pay

## 2022-12-29 ENCOUNTER — Other Ambulatory Visit (HOSPITAL_COMMUNITY): Payer: Self-pay

## 2023-01-01 ENCOUNTER — Other Ambulatory Visit (HOSPITAL_COMMUNITY): Payer: Self-pay

## 2023-01-01 MED ORDER — EST ESTROGENS-METHYLTEST 0.625-1.25 MG PO TABS
0.5000 | ORAL_TABLET | Freq: Every day | ORAL | 1 refills | Status: AC
  Filled 2023-01-01: qty 45, 90d supply, fill #0
  Filled 2023-03-29: qty 45, 90d supply, fill #1

## 2023-01-06 ENCOUNTER — Other Ambulatory Visit (HOSPITAL_COMMUNITY): Payer: Self-pay

## 2023-01-06 DIAGNOSIS — L821 Other seborrheic keratosis: Secondary | ICD-10-CM | POA: Diagnosis not present

## 2023-01-06 DIAGNOSIS — D225 Melanocytic nevi of trunk: Secondary | ICD-10-CM | POA: Diagnosis not present

## 2023-01-06 DIAGNOSIS — L578 Other skin changes due to chronic exposure to nonionizing radiation: Secondary | ICD-10-CM | POA: Diagnosis not present

## 2023-01-06 DIAGNOSIS — L718 Other rosacea: Secondary | ICD-10-CM | POA: Diagnosis not present

## 2023-01-06 DIAGNOSIS — L814 Other melanin hyperpigmentation: Secondary | ICD-10-CM | POA: Diagnosis not present

## 2023-01-06 DIAGNOSIS — L570 Actinic keratosis: Secondary | ICD-10-CM | POA: Diagnosis not present

## 2023-01-06 DIAGNOSIS — L218 Other seborrheic dermatitis: Secondary | ICD-10-CM | POA: Diagnosis not present

## 2023-01-06 MED ORDER — TRAZODONE HCL 50 MG PO TABS
50.0000 mg | ORAL_TABLET | Freq: Every day | ORAL | 0 refills | Status: DC
  Filled 2023-01-06: qty 90, 90d supply, fill #0

## 2023-01-06 MED ORDER — METRONIDAZOLE 0.75 % EX CREA
TOPICAL_CREAM | CUTANEOUS | 2 refills | Status: DC
  Filled 2023-01-06: qty 45, 30d supply, fill #0

## 2023-01-07 ENCOUNTER — Other Ambulatory Visit: Payer: Self-pay

## 2023-01-12 ENCOUNTER — Other Ambulatory Visit (HOSPITAL_COMMUNITY): Payer: Self-pay

## 2023-01-21 ENCOUNTER — Other Ambulatory Visit (HOSPITAL_COMMUNITY): Payer: Self-pay

## 2023-01-21 DIAGNOSIS — F4322 Adjustment disorder with anxiety: Secondary | ICD-10-CM | POA: Diagnosis not present

## 2023-01-23 ENCOUNTER — Other Ambulatory Visit (HOSPITAL_COMMUNITY): Payer: Self-pay

## 2023-01-23 ENCOUNTER — Other Ambulatory Visit: Payer: Self-pay

## 2023-01-23 MED ORDER — LOSARTAN POTASSIUM-HCTZ 50-12.5 MG PO TABS
1.0000 | ORAL_TABLET | Freq: Every day | ORAL | 0 refills | Status: DC
  Filled 2023-01-23: qty 90, 90d supply, fill #0

## 2023-01-28 DIAGNOSIS — F4322 Adjustment disorder with anxiety: Secondary | ICD-10-CM | POA: Diagnosis not present

## 2023-02-03 ENCOUNTER — Other Ambulatory Visit: Payer: Self-pay

## 2023-02-03 ENCOUNTER — Other Ambulatory Visit (HOSPITAL_COMMUNITY): Payer: Self-pay

## 2023-02-03 DIAGNOSIS — Z79890 Hormone replacement therapy (postmenopausal): Secondary | ICD-10-CM | POA: Diagnosis not present

## 2023-02-03 DIAGNOSIS — I10 Essential (primary) hypertension: Secondary | ICD-10-CM | POA: Diagnosis not present

## 2023-02-03 DIAGNOSIS — H5203 Hypermetropia, bilateral: Secondary | ICD-10-CM | POA: Diagnosis not present

## 2023-02-03 DIAGNOSIS — N1831 Chronic kidney disease, stage 3a: Secondary | ICD-10-CM | POA: Diagnosis not present

## 2023-02-03 DIAGNOSIS — F411 Generalized anxiety disorder: Secondary | ICD-10-CM | POA: Diagnosis not present

## 2023-02-03 DIAGNOSIS — F909 Attention-deficit hyperactivity disorder, unspecified type: Secondary | ICD-10-CM | POA: Diagnosis not present

## 2023-02-03 MED ORDER — BUPROPION HCL ER (XL) 300 MG PO TB24
300.0000 mg | ORAL_TABLET | Freq: Every day | ORAL | 1 refills | Status: DC
  Filled 2023-02-03: qty 90, 90d supply, fill #0
  Filled 2023-05-02: qty 90, 90d supply, fill #1

## 2023-02-03 MED ORDER — AMPHETAMINE-DEXTROAMPHET ER 15 MG PO CP24
15.0000 mg | ORAL_CAPSULE | Freq: Every morning | ORAL | 0 refills | Status: DC
  Filled 2023-02-12: qty 90, 90d supply, fill #0

## 2023-02-03 MED ORDER — EST ESTROGENS-METHYLTEST 0.625-1.25 MG PO TABS
0.5000 | ORAL_TABLET | Freq: Every day | ORAL | 1 refills | Status: AC
  Filled 2023-02-03: qty 45, 90d supply, fill #0

## 2023-02-12 ENCOUNTER — Other Ambulatory Visit (HOSPITAL_COMMUNITY): Payer: Self-pay

## 2023-02-13 ENCOUNTER — Other Ambulatory Visit: Payer: Self-pay

## 2023-02-19 DIAGNOSIS — F4322 Adjustment disorder with anxiety: Secondary | ICD-10-CM | POA: Diagnosis not present

## 2023-02-26 DIAGNOSIS — I10 Essential (primary) hypertension: Secondary | ICD-10-CM | POA: Diagnosis not present

## 2023-02-26 DIAGNOSIS — R519 Headache, unspecified: Secondary | ICD-10-CM | POA: Diagnosis not present

## 2023-02-26 DIAGNOSIS — R42 Dizziness and giddiness: Secondary | ICD-10-CM | POA: Diagnosis not present

## 2023-03-09 DIAGNOSIS — F4322 Adjustment disorder with anxiety: Secondary | ICD-10-CM | POA: Diagnosis not present

## 2023-03-29 ENCOUNTER — Other Ambulatory Visit (HOSPITAL_COMMUNITY): Payer: Self-pay

## 2023-03-30 ENCOUNTER — Other Ambulatory Visit: Payer: Self-pay

## 2023-03-31 ENCOUNTER — Other Ambulatory Visit (HOSPITAL_COMMUNITY): Payer: Self-pay

## 2023-04-01 ENCOUNTER — Other Ambulatory Visit (HOSPITAL_COMMUNITY): Payer: Self-pay

## 2023-04-01 DIAGNOSIS — F4322 Adjustment disorder with anxiety: Secondary | ICD-10-CM | POA: Diagnosis not present

## 2023-04-01 MED ORDER — TRAZODONE HCL 50 MG PO TABS
50.0000 mg | ORAL_TABLET | Freq: Every day | ORAL | 0 refills | Status: DC
  Filled 2023-04-01: qty 90, 90d supply, fill #0

## 2023-04-02 ENCOUNTER — Other Ambulatory Visit: Payer: Self-pay

## 2023-04-20 DIAGNOSIS — F4322 Adjustment disorder with anxiety: Secondary | ICD-10-CM | POA: Diagnosis not present

## 2023-04-21 ENCOUNTER — Other Ambulatory Visit: Payer: Self-pay

## 2023-04-21 ENCOUNTER — Other Ambulatory Visit (HOSPITAL_COMMUNITY): Payer: Self-pay

## 2023-04-21 MED ORDER — LOSARTAN POTASSIUM-HCTZ 50-12.5 MG PO TABS
1.0000 | ORAL_TABLET | Freq: Every day | ORAL | 1 refills | Status: DC
  Filled 2023-04-21: qty 90, 90d supply, fill #0

## 2023-04-23 ENCOUNTER — Other Ambulatory Visit (HOSPITAL_COMMUNITY): Payer: Self-pay

## 2023-04-23 ENCOUNTER — Other Ambulatory Visit: Payer: Self-pay

## 2023-04-23 DIAGNOSIS — Z09 Encounter for follow-up examination after completed treatment for conditions other than malignant neoplasm: Secondary | ICD-10-CM | POA: Diagnosis not present

## 2023-04-23 DIAGNOSIS — L309 Dermatitis, unspecified: Secondary | ICD-10-CM | POA: Diagnosis not present

## 2023-04-23 DIAGNOSIS — L570 Actinic keratosis: Secondary | ICD-10-CM | POA: Diagnosis not present

## 2023-04-23 DIAGNOSIS — L718 Other rosacea: Secondary | ICD-10-CM | POA: Diagnosis not present

## 2023-04-23 MED ORDER — TRIAMCINOLONE ACETONIDE 0.1 % EX CREA
TOPICAL_CREAM | CUTANEOUS | 1 refills | Status: DC
  Filled 2023-04-23: qty 15, 14d supply, fill #0

## 2023-04-23 MED ORDER — HYDROCHLOROTHIAZIDE 12.5 MG PO TABS
12.5000 mg | ORAL_TABLET | Freq: Every morning | ORAL | 0 refills | Status: DC
  Filled 2023-04-23: qty 30, 30d supply, fill #0

## 2023-04-27 ENCOUNTER — Other Ambulatory Visit (HOSPITAL_COMMUNITY): Payer: Self-pay

## 2023-04-30 ENCOUNTER — Other Ambulatory Visit: Payer: Self-pay

## 2023-04-30 ENCOUNTER — Other Ambulatory Visit (HOSPITAL_COMMUNITY): Payer: Self-pay

## 2023-04-30 DIAGNOSIS — N183 Chronic kidney disease, stage 3 unspecified: Secondary | ICD-10-CM | POA: Diagnosis not present

## 2023-04-30 DIAGNOSIS — I10 Essential (primary) hypertension: Secondary | ICD-10-CM | POA: Diagnosis not present

## 2023-04-30 MED ORDER — LOSARTAN POTASSIUM-HCTZ 100-12.5 MG PO TABS
1.0000 | ORAL_TABLET | Freq: Every day | ORAL | 1 refills | Status: DC
  Filled 2023-04-30: qty 30, 30d supply, fill #0
  Filled 2023-05-28: qty 30, 30d supply, fill #1

## 2023-05-02 ENCOUNTER — Other Ambulatory Visit (HOSPITAL_COMMUNITY): Payer: Self-pay

## 2023-05-05 DIAGNOSIS — F4322 Adjustment disorder with anxiety: Secondary | ICD-10-CM | POA: Diagnosis not present

## 2023-05-19 ENCOUNTER — Other Ambulatory Visit (HOSPITAL_COMMUNITY): Payer: Self-pay

## 2023-05-20 ENCOUNTER — Other Ambulatory Visit (HOSPITAL_COMMUNITY): Payer: Self-pay

## 2023-05-20 MED ORDER — AMPHETAMINE-DEXTROAMPHET ER 15 MG PO CP24
15.0000 mg | ORAL_CAPSULE | Freq: Every morning | ORAL | 0 refills | Status: AC
  Filled 2023-05-20 – 2023-05-21 (×2): qty 90, 90d supply, fill #0

## 2023-05-21 ENCOUNTER — Other Ambulatory Visit (HOSPITAL_COMMUNITY): Payer: Self-pay

## 2023-05-28 ENCOUNTER — Other Ambulatory Visit (HOSPITAL_COMMUNITY): Payer: Self-pay

## 2023-06-09 DIAGNOSIS — F4322 Adjustment disorder with anxiety: Secondary | ICD-10-CM | POA: Diagnosis not present

## 2023-06-29 ENCOUNTER — Other Ambulatory Visit (HOSPITAL_COMMUNITY): Payer: Self-pay

## 2023-06-29 DIAGNOSIS — F4322 Adjustment disorder with anxiety: Secondary | ICD-10-CM | POA: Diagnosis not present

## 2023-06-29 MED ORDER — TRAZODONE HCL 50 MG PO TABS
50.0000 mg | ORAL_TABLET | Freq: Every day | ORAL | 0 refills | Status: DC
  Filled 2023-06-29: qty 90, 90d supply, fill #0

## 2023-06-29 MED ORDER — LOSARTAN POTASSIUM-HCTZ 100-12.5 MG PO TABS
1.0000 | ORAL_TABLET | Freq: Every day | ORAL | 1 refills | Status: DC
  Filled 2023-06-29: qty 30, 30d supply, fill #0
  Filled 2023-07-31: qty 30, 30d supply, fill #1

## 2023-06-30 ENCOUNTER — Other Ambulatory Visit (HOSPITAL_COMMUNITY): Payer: Self-pay

## 2023-06-30 ENCOUNTER — Other Ambulatory Visit: Payer: Self-pay

## 2023-06-30 MED ORDER — EST ESTROGENS-METHYLTEST 0.625-1.25 MG PO TABS
0.5000 | ORAL_TABLET | Freq: Every day | ORAL | 0 refills | Status: DC
  Filled 2023-06-30: qty 45, 90d supply, fill #0

## 2023-07-25 ENCOUNTER — Other Ambulatory Visit (HOSPITAL_COMMUNITY): Payer: Self-pay

## 2023-07-25 MED ORDER — BENZONATATE 200 MG PO CAPS
ORAL_CAPSULE | ORAL | 0 refills | Status: DC
  Filled 2023-07-25: qty 21, 7d supply, fill #0

## 2023-07-31 ENCOUNTER — Other Ambulatory Visit (HOSPITAL_COMMUNITY): Payer: Self-pay

## 2023-08-03 ENCOUNTER — Other Ambulatory Visit (HOSPITAL_COMMUNITY): Payer: Self-pay

## 2023-08-03 MED ORDER — BUPROPION HCL ER (XL) 300 MG PO TB24
300.0000 mg | ORAL_TABLET | Freq: Every day | ORAL | 0 refills | Status: AC
  Filled 2023-08-03: qty 90, 90d supply, fill #0

## 2023-08-11 DIAGNOSIS — N1831 Chronic kidney disease, stage 3a: Secondary | ICD-10-CM | POA: Diagnosis not present

## 2023-08-11 DIAGNOSIS — Z7185 Encounter for immunization safety counseling: Secondary | ICD-10-CM | POA: Diagnosis not present

## 2023-08-11 DIAGNOSIS — Z79890 Hormone replacement therapy (postmenopausal): Secondary | ICD-10-CM | POA: Diagnosis not present

## 2023-08-11 DIAGNOSIS — I10 Essential (primary) hypertension: Secondary | ICD-10-CM | POA: Diagnosis not present

## 2023-08-11 DIAGNOSIS — F411 Generalized anxiety disorder: Secondary | ICD-10-CM | POA: Diagnosis not present

## 2023-08-11 DIAGNOSIS — Z0000 Encounter for general adult medical examination without abnormal findings: Secondary | ICD-10-CM | POA: Diagnosis not present

## 2023-08-11 DIAGNOSIS — F909 Attention-deficit hyperactivity disorder, unspecified type: Secondary | ICD-10-CM | POA: Diagnosis not present

## 2023-08-11 DIAGNOSIS — Z13220 Encounter for screening for lipoid disorders: Secondary | ICD-10-CM | POA: Diagnosis not present

## 2023-08-20 ENCOUNTER — Other Ambulatory Visit (HOSPITAL_COMMUNITY): Payer: Self-pay

## 2023-08-20 DIAGNOSIS — F4322 Adjustment disorder with anxiety: Secondary | ICD-10-CM | POA: Diagnosis not present

## 2023-08-20 MED ORDER — AMPHETAMINE-DEXTROAMPHET ER 15 MG PO CP24
15.0000 mg | ORAL_CAPSULE | Freq: Every morning | ORAL | 0 refills | Status: AC
  Filled 2023-08-20: qty 90, 90d supply, fill #0

## 2023-08-21 ENCOUNTER — Other Ambulatory Visit: Payer: Self-pay

## 2023-08-31 ENCOUNTER — Other Ambulatory Visit (HOSPITAL_COMMUNITY): Payer: Self-pay

## 2023-08-31 MED ORDER — LOSARTAN POTASSIUM-HCTZ 100-12.5 MG PO TABS
1.0000 | ORAL_TABLET | Freq: Every day | ORAL | 2 refills | Status: AC
  Filled 2023-08-31 – 2023-11-27 (×2): qty 90, 90d supply, fill #0

## 2023-09-01 ENCOUNTER — Other Ambulatory Visit (HOSPITAL_COMMUNITY): Payer: Self-pay

## 2023-09-15 ENCOUNTER — Telehealth: Payer: Self-pay | Admitting: Gastroenterology

## 2023-09-15 NOTE — Telephone Encounter (Signed)
Spoke with patient.  She states she saw Dr. Lavon Paganini about a year ago for really bad back pain which seemed to be coming from blocked bowels.  She has started experiencing that same back pain and is questioning how to know the difference between blocked bowels and maybe a pulled muscle.  She did state she is having difficulty using the bathroom.  Please call patient and advise.  Thank you.

## 2023-09-16 NOTE — Telephone Encounter (Signed)
Inbound call from patient, following up on call below.

## 2023-09-16 NOTE — Telephone Encounter (Signed)
Patient called this morning to follow up on message below, is asking for a call back as soon as possible because she is in a lot of pain.

## 2023-09-16 NOTE — Telephone Encounter (Addendum)
Spoke with the patient. She has done well with avoiding constipation for a year now. She is reaching out to [place] with complaints of constipation with back pain. She has taken Dulcolax with some relief. She tells me she did not have a bowel movement for 1 week. She developed low back pain into her legs an"decided something had to be done." She ate the foods that normally help her to move her bowels, like granola and taking fiber gummies. On Tuesday night she took Dulcolax. Today as her bowels are moving, the pain has decreased. The patient would like to come in to discuss a plan for management. She agrees to see Fredric Mare on 09/22/23.

## 2023-09-17 NOTE — Telephone Encounter (Signed)
ok 

## 2023-09-22 ENCOUNTER — Ambulatory Visit
Admission: RE | Admit: 2023-09-22 | Discharge: 2023-09-22 | Disposition: A | Discharge status: Home / Self Care | Payer: 59 | Source: Ambulatory Visit | Attending: Gastroenterology

## 2023-09-22 ENCOUNTER — Ambulatory Visit: Payer: 59 | Admitting: Gastroenterology

## 2023-09-22 ENCOUNTER — Encounter: Payer: Self-pay | Admitting: Gastroenterology

## 2023-09-22 VITALS — BP 122/74 | HR 78 | Ht 66.0 in | Wt 162.4 lb

## 2023-09-22 DIAGNOSIS — M6289 Other specified disorders of muscle: Secondary | ICD-10-CM | POA: Diagnosis not present

## 2023-09-22 DIAGNOSIS — D649 Anemia, unspecified: Secondary | ICD-10-CM

## 2023-09-22 DIAGNOSIS — M545 Low back pain, unspecified: Secondary | ICD-10-CM | POA: Diagnosis not present

## 2023-09-22 DIAGNOSIS — K5904 Chronic idiopathic constipation: Secondary | ICD-10-CM

## 2023-09-22 DIAGNOSIS — R1032 Left lower quadrant pain: Secondary | ICD-10-CM

## 2023-09-22 DIAGNOSIS — I878 Other specified disorders of veins: Secondary | ICD-10-CM | POA: Diagnosis not present

## 2023-09-22 DIAGNOSIS — K5909 Other constipation: Secondary | ICD-10-CM

## 2023-09-22 NOTE — Progress Notes (Addendum)
Chief Complaint: Constipation Primary GI MD: Dr. Lavon Paganini  HPI:  57 year old female presents for evaluation of constipation and back pain  Discussed the use of AI scribe software for clinical note transcription with the patient, who gave verbal consent to proceed.  History of Present Illness   The patient, with a history of diverticulosis, interstitial cystitis, and non-fatty liver disease, presents with worsening constipation and back pain. She reports not having a bowel movement for a week, despite taking Dulcolax and a fiber supplement (gummies). The constipation is associated with severe back pain that radiates down the legs, which started after consuming granola and yogurt. The pain is now localized to the lower left side of the back and intensifies with straining during bowel movements. She also reports feeling gassy and experiencing nausea when the pain was at its worst. She denies hematochezia or melena.  The patient typically has two to three bowel movements per week, mostly on weekends, and denies withholding bowel movements during the week due to work-related stress. She expresses concern about potential obstruction due to the severity of her constipation and the reduced force of her urine stream, which she attributes to the constipation pressing against her bladder.      Denies weight loss, melena, hematochezia.  She was referred to pelvic floor physical therapy. At last visit, Dr. Lavon Paganini felt she had pelvic floor dysfunction. Patient notes she went to one session and did not continue.  PREVIOUS GI WORKUP   Colonoscopy 09/23/21 - The perianal and digital rectal examinations were normal. - A 7 mm polyp was found in the ascending colon. The polyp was sessile. The polyp was removed with a cold snare. Resection and retrieval were complete. - Nonbleeding superficial linear ulcerated mucosa were present in the rectum. Biopsies were taken with a cold forceps for histology. -  Non-bleeding external and internal hemorrhoids were found during retroflexion. The hemorrhoids were medium-sized. - The exam was otherwise without abnormality. - repeat 5 years (2027)   1. Surgical [P], colon, ascending, polyp (1) - HYPERPLASTIC POLYP - MULTIPLE STEP SECTIONS WERE EXAMINED 2. Surgical [P], colon, rectum - MILDLY ACTIVE NONSPECIFIC PROCTITIS. SEE NOTE - NEGATIVE FOR FEATURES OF CHRONICITY OR GRANULOMAS Diagnosis Note 2. Differential diagnosis can include drug-effect, infection, stercoral proctitis and ischemia.  Past Medical History:  Diagnosis Date   ADD (attention deficit disorder)    Anxiety    Cervical radiculopathy 2014   L arm (into L4th and 5th fingers)   Depression    Endometriosis    Hypertension    on meds   Interstitial cystitis    OA (osteoarthritis)    PONV (postoperative nausea and vomiting)    Right knee meniscal tear    Wears glasses     Past Surgical History:  Procedure Laterality Date   BLADDER SURGERY     due to interstitial cystitis   BREAST REDUCTION SURGERY  1988   CHONDROPLASTY Right 05/11/2019   Procedure: CHONDROPLASTY;  Surgeon: Ollen Gross, MD;  Location: [facility];  Service: Orthopedics;  Laterality: Right;   CLOSED MANIPULATION SHOULDER Right 11/15/2009   CYSTOSCOPY WITH HYDRODISTENSION AND BIOPSY  01-17-2004    dr Isabel Caprice  @[facility]    for IC   DIAGNOSTIC LAPAROSCOPY  1988;  05-28-2006   KNEE ARTHROSCOPY Bilateral right 1996;  left 11-02-2008   KNEE ARTHROSCOPY Right 05/11/2019   Procedure: ARTHROSCOPY KNEE, MEDIAL MENISCAL DEBRIDEMENT;  Surgeon: Ollen Gross, MD;  Location: [facility];  Service: Orthopedics;  Laterality:  Right;   KNEE ARTHROSCOPY WITH MEDIAL MENISECTOMY Right 08/13/2018   Procedure: RIGHT KNEE ARTHROSCOPY WITH DEBRIDEMENT chondroplasty;  Surgeon: Kathryne Hitch, MD;  Location: [facility];  Service: Orthopedics;  Laterality: Right;   REDUCTION MAMMAPLASTY     SHOULDER  ARTHROSCOPY Bilateral right 08-07-2009;  let 04-17-2011   TOTAL ABDOMINAL HYSTERECTOMY W/ BILATERAL SALPINGOOPHORECTOMY  05-19-2007   dr Sydnee Cabal  @[facility]     Current Outpatient Medications  Medication Sig Dispense Refill   acetaminophen (TYLENOL) 500 MG tablet Take 1,000 mg by mouth every 6 (six) hours as needed for mild pain or headache.     amphetamine-dextroamphetamine (ADDERALL XR) 15 MG 24 hr capsule Take 1 capsule by mouth in the morning by mouth (30 day supply) *Please fill 30 days after last fill 30 capsule 0   amphetamine-dextroamphetamine (ADDERALL XR) 15 MG 24 hr capsule Take 1 capsule by mouth in the morning ...may fill 30 days after last fill * Fill 09/16 30 capsule 0   amphetamine-dextroamphetamine (ADDERALL XR) 15 MG 24 hr capsule Take 1 capsule by mouth every morning. 30 capsule 0   amphetamine-dextroamphetamine (ADDERALL XR) 15 MG 24 hr capsule Take 1 capsule by mouth every morning. may fill 30 days after last refill (11/02/22) 30 capsule 0   amphetamine-dextroamphetamine (ADDERALL XR) 15 MG 24 hr capsule Take 1 capsule by mouth every morning. 30 capsule 0   amphetamine-dextroamphetamine (ADDERALL XR) 15 MG 24 hr capsule Take 1 capsule by mouth every morning. 30 capsule 0   amphetamine-dextroamphetamine (ADDERALL XR) 15 MG 24 hr capsule Take 1 capsule by mouth every morning. 30 capsule 0   amphetamine-dextroamphetamine (ADDERALL XR) 15 MG 24 hr capsule Take 1 capsule by mouth in the morning. 90 capsule 0   amphetamine-dextroamphetamine (ADDERALL XR) 15 MG 24 hr capsule Take 1 capsule by mouth in the morning. 90 capsule 0   buPROPion (WELLBUTRIN XL) 300 MG 24 hr tablet Take 1 tablet by mouth once a day 90 tablet 1   buPROPion (WELLBUTRIN XL) 300 MG 24 hr tablet Take 1 tablet by mouth once a day. 90 tablet 1   buPROPion (WELLBUTRIN XL) 300 MG 24 hr tablet Take 1 tablet (300 mg total) by mouth daily. 90 tablet 0   estrogen-methylTESTOSTERone 0.625-1.25 MG tablet Take 1/2 tablet by mouth  with food once a day for 90 days. 45 tablet 1   estrogen-methylTESTOSTERone 0.625-1.25 MG tablet Take 1/2 tablet once daily. 45 tablet 0   estrogen-methylTESTOSTERone 0.625-1.25 MG tablet Take 0.5 tablets by mouth daily with food 45 tablet 1   estrogen-methylTESTOSTERone 0.625-1.25 MG tablet Take 0.5 tablets by mouth daily with food. 45 tablet 1   estrogen-methylTESTOSTERone 0.625-1.25 MG tablet Take 0.5 tablets by mouth daily with food. 45 tablet 0   FIBER ADULT GUMMIES PO Take 1 tablet by mouth daily.     losartan-hydrochlorothiazide (HYZAAR) 100-12.5 MG tablet Take 1 tablet by mouth daily. 90 tablet 2   traZODone (DESYREL) 50 MG tablet Take 1 tablet (50 mg total) by mouth daily. 90 tablet 0   No current facility-administered medications for this visit.    Allergies as of 09/22/2023   (No Known Allergies)    Family History  Problem Relation Age of Onset   Colon polyps Mother    Heart disease Father    Stroke Father    Mental illness Father    Mental illness Sister    Stroke Brother    Diabetes Brother    Hyperlipidemia Brother    Colon cancer Maternal  Uncle    Colon polyps Maternal Grandmother    Colon cancer Maternal Grandmother    Breast cancer Neg Hx    Stomach cancer Neg Hx    Rectal cancer Neg Hx    Esophageal cancer Neg Hx    Pancreatic cancer Neg Hx     Social History   Socioeconomic History   Marital status: Married    Spouse name: Not on file   Number of children: 2   Years of education: Not on file   Highest education level: Not on file  Occupational History   Not on file  Tobacco Use   Smoking status: Former    Current packs/day: 0.00    Types: Cigarettes    Start date: 08/04/1986    Quit date: 08/04/1990    Years since quitting: 33.1   Smokeless tobacco: Never  Vaping Use   Vaping status: Never Used  Substance and Sexual Activity   Alcohol use: Yes    Alcohol/week: 2.0 standard drinks of alcohol    Types: 2 Standard drinks or equivalent per week     Comment: rare   Drug use: No   Sexual activity: Yes    Birth control/protection: Surgical  Other Topics Concern   Not on file  Social History Narrative   Not on file   Social Determinants of Health   Financial Resource Strain: Not on file  Food Insecurity: Not on file  Transportation Needs: Not on file  Physical Activity: Not on file  Stress: Not on file  Social Connections: Not on file  Intimate Partner Violence: Not on file    Review of Systems:    Constitutional: No weight loss, fever, chills, weakness or fatigue HEENT: Eyes: No change in vision               Ears, Nose, Throat:  No change in hearing or congestion Skin: No rash or itching Cardiovascular: No chest pain, chest pressure or palpitations   Respiratory: No SOB or cough Gastrointestinal: See HPI and otherwise negative Genitourinary: No dysuria or change in urinary frequency Neurological: No headache, dizziness or syncope Musculoskeletal: No new muscle or joint pain Hematologic: No bleeding or bruising Psychiatric: No history of depression or anxiety    Physical Exam:  Vital signs: BP 122/74   Pulse 78   Ht 5\' 6"  (1.676 m)   Wt 73.7 kg   SpO2 95%   BMI 26.21 kg/m   Constitutional: NAD, Well developed, Well nourished, alert and cooperative Head:  Normocephalic and atraumatic. Eyes:   PEERL, EOMI. No icterus. Conjunctiva pink. Respiratory: Respirations even and unlabored. Lungs clear to auscultation bilaterally.   No wheezes, crackles, or rhonchi.  Cardiovascular:  Regular rate and rhythm. No peripheral edema, cyanosis or pallor.  Gastrointestinal:  Soft, nondistended, LLQ tenderness. No rebound or guarding. Normal bowel sounds. No appreciable masses or hepatomegaly. Rectal:  Not performed.  Msk:  Symmetrical without gross deformities. Without edema, no deformity or joint abnormality.  Neurologic:  Alert and  oriented x4;  grossly normal neurologically.  Skin:   Dry and intact without significant  lesions or rashes. Psychiatric: Oriented to person, place and time. Demonstrates good judgement and reason without abnormal affect or behaviors.   RELEVANT LABS AND IMAGING: CBC    Component Value Date/Time   WBC 5.8 07/30/2018 1108   RBC 4.48 07/30/2018 1108   HGB 12.9 05/11/2019 0706   HCT 38.0 05/11/2019 0706   PLT 230 07/30/2018 1108   MCV 92.0 07/30/2018 1108  MCV 89.3 02/01/2016 1449   MCH 31.3 07/30/2018 1108   MCHC 34.0 07/30/2018 1108   RDW 12.4 07/30/2018 1108   LYMPHSABS 2.7 04/26/2013 1710   MONOABS 0.7 04/26/2013 1710   EOSABS 0.1 04/26/2013 1710   BASOSABS 0.0 04/26/2013 1710    CMP     Component Value Date/Time   NA 140 05/11/2019 0706   K 3.5 05/11/2019 0706   CL 104 05/11/2019 0706   CO2 31 07/30/2018 1108   GLUCOSE 96 05/11/2019 0706   BUN 13 05/11/2019 0706   CREATININE 1.00 05/11/2019 0706   CREATININE 1.12 (H) 10/21/2015 1422   CALCIUM 9.3 07/30/2018 1108   PROT 6.9 09/19/2015 1938   ALBUMIN 4.4 09/19/2015 1938   AST 20 09/19/2015 1938   ALT 33 (H) 09/19/2015 1938   ALKPHOS 64 09/19/2015 1938   BILITOT 0.4 09/19/2015 1938   GFRNONAA 50 (L) 07/30/2018 1108   GFRNONAA 56 (L) 09/19/2015 1938   GFRAA 58 (L) 07/30/2018 1108   GFRAA 64 09/19/2015 1938     Assessment/Plan:      Chronic Constipation Severe constipation with back pain and infrequent bowel movements (2-3 times per week). Patient has been using fiber gummies and Dulcolax with limited success. No signs of obstruction on physical exam. -Start MiraLAX daily, adjust dose after one week based on response. -Encourage increased water intake to at least 64 ounces per day. -Order abdominal X-ray to rule out obstruction. -continue pelvic floor therapy exercises at home -Check-in via MyChart in two weeks to assess response to treatment. - follow up in 6 months  Back Pain Pain in lower left side of back, worsens with straining. Possible relation to constipation. -Address constipation as  primary issue, reassess back pain after bowel habits improve.  Interstitial Cystitis (IC) and Non-Alcoholic Fatty Liver Disease (NAFLD) Chronic conditions managed by primary care provider. No acute issues discussed in this visit. -Continue current management as directed by primary care provider.  Diverticulosis No acute issues. Patient was previously misinformed about dietary restrictions. -Educate patient that there are no specific dietary restrictions for diverticulosis. -Advise patient to continue a high fiber diet to manage constipation.      [name] [name]  Gastroenterology 09/22/2023, 10:43 AM  Cc: Aliene Beams, MD   Attending physician's note   I have taken a history, reviewed the chart and examined the patient. I performed a substantive portion of this encounter, including complete performance of at least one of the key components, in conjunction with the APP. I agree with the APP's note, impression and recommendations.   Severe constipation leading to lower back pain Advised patient to start MiraLAX daily, adjust dose after one week based on response. -Encouraged increased water intake to at least 64 ounces per day. -Order abdominal X-ray to rule out obstruction. -Check-in via MyChart in two weeks to assess response to treatment.  The patient was provided an opportunity to ask questions and all were answered. The patient agreed with the plan and demonstrated an understanding of the instructions.   Iona Beard , MD [phone]

## 2023-09-22 NOTE — Patient Instructions (Addendum)
VISIT SUMMARY:  During your visit, we discussed your recurrent severe constipation and pelvic pain, as well as the pain radiating down your leg. We believe these issues may be related to your history of pelvic floor dysfunction and prior hysterectomy. We also discussed your attempts to manage your symptoms, including dietary changes and physical therapy exercises.  YOUR PLAN:  Your provider has requested that you have an abdominal x ray before leaving today. Please go to the basement floor to our Radiology department for the test.   -CHRONIC CONSTIPATION: This is a condition where you have difficulty passing stool or do not have bowel movements regularly.  We will continue with Benefiber three times a day and adjust MiraLAX as needed to maintain soft formed stool.  Start taking Miralax 1 capful (17 grams) 1x / day for 1 week.   If this is not effective, increase to 1 dose 2x / day for 1 week.   If this is still not effective, increase to two capfuls (34 grams) 2x / day.   Can adjust dose as needed based on response. Can take 1/2 cap daily, skip days, or increase per day.    We also recommend using a step stool during bowel movements to aid in evacuation. If constipation continues to be a problem, consider resuming physical therapy. If you have no bowel movement for 3-4 days, use a glycerin suppository. We will also order an abdominal x-ray to rule out bowel obstruction.  -LEG PAIN: This is pain that radiates down your leg, possibly related to your constipation and pressure on the pelvic floor. If this pain persists, we recommend an evaluation by your primary care provider for possible sciatica, a condition where pressure or damage to the sciatic nerve causes pain and discomfort.  INSTRUCTIONS:  Please continue with the Benefiber and MiraLAX as discussed. Use a step stool during bowel movements and consider resuming physical therapy if constipation continues to be a problem. If you have no bowel  movement for 3-4 days, use a glycerin suppository. We will order an abdominal x-ray and will follow-up in six months to monitor your progress. If your leg pain persists, please see your primary care provider for a possible evaluation for sciatica.   Due to recent changes in healthcare laws, you may see the results of your imaging and laboratory studies on MyChart before your provider has had a chance to review them.  We understand that in some cases there may be results that are confusing or concerning to you. Not all laboratory results come back in the same time frame and the provider may be waiting for multiple results in order to interpret others.  Please give [place] 48 hours in order for your provider to thoroughly review all the results before contacting the office for clarification of your results.    I appreciate the  opportunity to care for you  Thank You   Marsa Aris , MD

## 2023-09-24 ENCOUNTER — Other Ambulatory Visit (HOSPITAL_COMMUNITY): Payer: Self-pay

## 2023-09-24 MED ORDER — AMOXICILLIN-POT CLAVULANATE 500-125 MG PO TABS
1.0000 | ORAL_TABLET | Freq: Three times a day (TID) | ORAL | 0 refills | Status: DC
  Filled 2023-09-24: qty 21, 7d supply, fill #0

## 2023-09-28 ENCOUNTER — Other Ambulatory Visit: Payer: Self-pay

## 2023-09-28 ENCOUNTER — Encounter: Payer: Self-pay | Admitting: Gastroenterology

## 2023-09-28 ENCOUNTER — Other Ambulatory Visit (HOSPITAL_COMMUNITY): Payer: Self-pay

## 2023-09-28 DIAGNOSIS — F4322 Adjustment disorder with anxiety: Secondary | ICD-10-CM | POA: Diagnosis not present

## 2023-09-28 MED ORDER — TRAZODONE HCL 50 MG PO TABS
50.0000 mg | ORAL_TABLET | Freq: Every day | ORAL | 0 refills | Status: AC
  Filled 2023-09-28: qty 90, 90d supply, fill #0

## 2023-09-28 MED ORDER — EST ESTROGENS-METHYLTEST 0.625-1.25 MG PO TABS
0.5000 | ORAL_TABLET | Freq: Every day | ORAL | 0 refills | Status: DC
  Filled 2023-09-28: qty 45, 90d supply, fill #0

## 2023-09-29 ENCOUNTER — Other Ambulatory Visit (HOSPITAL_COMMUNITY): Payer: Self-pay

## 2023-09-30 NOTE — Addendum Note (Signed)
Addended by: Napoleon Form on: 09/30/2023 08:49 AM   Modules accepted: Orders, Level of Service

## 2023-10-02 ENCOUNTER — Encounter: Payer: Self-pay | Admitting: Gastroenterology

## 2023-10-06 ENCOUNTER — Encounter: Payer: Self-pay | Admitting: Gastroenterology

## 2023-10-07 ENCOUNTER — Other Ambulatory Visit: Payer: Self-pay

## 2023-10-07 ENCOUNTER — Other Ambulatory Visit (HOSPITAL_COMMUNITY): Payer: Self-pay

## 2023-10-07 MED ORDER — LINACLOTIDE 145 MCG PO CAPS
145.0000 ug | ORAL_CAPSULE | Freq: Every day | ORAL | 3 refills | Status: DC
  Filled 2023-10-07: qty 30, 30d supply, fill #0

## 2023-10-07 MED ORDER — LINACLOTIDE 145 MCG PO CAPS
145.0000 ug | ORAL_CAPSULE | Freq: Every day | ORAL | 3 refills | Status: AC
  Filled 2023-10-07: qty 30, 30d supply, fill #0

## 2023-10-08 ENCOUNTER — Other Ambulatory Visit (HOSPITAL_COMMUNITY): Payer: Self-pay

## 2023-10-17 ENCOUNTER — Other Ambulatory Visit: Payer: Self-pay | Admitting: Family Medicine

## 2023-10-17 DIAGNOSIS — Z1231 Encounter for screening mammogram for malignant neoplasm of breast: Secondary | ICD-10-CM

## 2023-10-19 ENCOUNTER — Other Ambulatory Visit (HOSPITAL_COMMUNITY): Payer: Self-pay

## 2023-10-26 DIAGNOSIS — F4322 Adjustment disorder with anxiety: Secondary | ICD-10-CM | POA: Diagnosis not present

## 2023-11-02 ENCOUNTER — Other Ambulatory Visit: Payer: Self-pay

## 2023-11-02 ENCOUNTER — Other Ambulatory Visit (HOSPITAL_COMMUNITY): Payer: Self-pay

## 2023-11-02 MED ORDER — BUPROPION HCL ER (XL) 300 MG PO TB24
300.0000 mg | ORAL_TABLET | Freq: Every day | ORAL | 2 refills | Status: DC
  Filled 2023-11-02 – 2024-02-04 (×3): qty 90, 90d supply, fill #0
  Filled 2024-05-02: qty 90, 90d supply, fill #1

## 2023-11-05 ENCOUNTER — Other Ambulatory Visit (HOSPITAL_COMMUNITY): Payer: Self-pay

## 2023-11-11 ENCOUNTER — Ambulatory Visit
Admission: RE | Admit: 2023-11-11 | Discharge: 2023-11-11 | Disposition: A | Discharge status: Home / Self Care | Payer: 59 | Source: Ambulatory Visit

## 2023-11-11 DIAGNOSIS — Z1231 Encounter for screening mammogram for malignant neoplasm of breast: Secondary | ICD-10-CM | POA: Diagnosis not present

## 2023-11-23 ENCOUNTER — Other Ambulatory Visit: Payer: Self-pay

## 2023-11-23 ENCOUNTER — Other Ambulatory Visit (HOSPITAL_COMMUNITY): Payer: Self-pay

## 2023-11-23 MED ORDER — AMPHETAMINE-DEXTROAMPHET ER 15 MG PO CP24
15.0000 mg | ORAL_CAPSULE | Freq: Every morning | ORAL | 0 refills | Status: AC
  Filled 2023-11-23: qty 90, 90d supply, fill #0
  Filled 2023-11-27: qty 36, 36d supply, fill #0
  Filled 2023-11-27: qty 54, 54d supply, fill #0
  Filled 2023-11-27: qty 90, 90d supply, fill #0

## 2023-11-24 ENCOUNTER — Other Ambulatory Visit: Payer: Self-pay

## 2023-11-26 ENCOUNTER — Other Ambulatory Visit (HOSPITAL_COMMUNITY): Payer: Self-pay

## 2023-11-27 ENCOUNTER — Other Ambulatory Visit (HOSPITAL_COMMUNITY): Payer: Self-pay

## 2023-11-27 ENCOUNTER — Other Ambulatory Visit: Payer: Self-pay

## 2023-11-30 ENCOUNTER — Other Ambulatory Visit (HOSPITAL_COMMUNITY): Payer: Self-pay

## 2023-11-30 MED ORDER — LOSARTAN POTASSIUM-HCTZ 100-12.5 MG PO TABS
1.0000 | ORAL_TABLET | Freq: Every day | ORAL | 2 refills | Status: AC
  Filled 2023-11-30: qty 90, 90d supply, fill #0
  Filled 2024-02-22: qty 90, 90d supply, fill #1
  Filled 2024-05-21: qty 90, 90d supply, fill #2

## 2023-12-22 ENCOUNTER — Ambulatory Visit: Payer: 59 | Admitting: Nurse Practitioner

## 2023-12-28 ENCOUNTER — Other Ambulatory Visit (HOSPITAL_COMMUNITY): Payer: Self-pay

## 2023-12-28 MED ORDER — ESTRATEST H.S. 0.625-1.25 MG PO TABS
0.5000 | ORAL_TABLET | Freq: Every day | ORAL | 0 refills | Status: AC
  Filled 2023-12-28: qty 45, 90d supply, fill #0

## 2024-01-04 ENCOUNTER — Other Ambulatory Visit (HOSPITAL_COMMUNITY): Payer: Self-pay

## 2024-01-04 MED ORDER — AMOXICILLIN 500 MG PO CAPS
500.0000 mg | ORAL_CAPSULE | Freq: Three times a day (TID) | ORAL | 0 refills | Status: DC
  Filled 2024-01-04: qty 24, 8d supply, fill #0

## 2024-01-12 DIAGNOSIS — L814 Other melanin hyperpigmentation: Secondary | ICD-10-CM | POA: Diagnosis not present

## 2024-01-12 DIAGNOSIS — L578 Other skin changes due to chronic exposure to nonionizing radiation: Secondary | ICD-10-CM | POA: Diagnosis not present

## 2024-01-12 DIAGNOSIS — L821 Other seborrheic keratosis: Secondary | ICD-10-CM | POA: Diagnosis not present

## 2024-01-12 DIAGNOSIS — L570 Actinic keratosis: Secondary | ICD-10-CM | POA: Diagnosis not present

## 2024-01-12 DIAGNOSIS — D225 Melanocytic nevi of trunk: Secondary | ICD-10-CM | POA: Diagnosis not present

## 2024-01-12 DIAGNOSIS — L218 Other seborrheic dermatitis: Secondary | ICD-10-CM | POA: Diagnosis not present

## 2024-01-26 ENCOUNTER — Telehealth: Payer: Self-pay | Admitting: Gastroenterology

## 2024-01-26 ENCOUNTER — Encounter: Payer: Self-pay | Admitting: Gastroenterology

## 2024-01-26 ENCOUNTER — Ambulatory Visit (INDEPENDENT_AMBULATORY_CARE_PROVIDER_SITE_OTHER): Payer: Commercial Managed Care - PPO | Admitting: Gastroenterology

## 2024-01-26 VITALS — BP 118/76 | HR 79 | Ht 64.0 in | Wt 164.0 lb

## 2024-01-26 DIAGNOSIS — K5900 Constipation, unspecified: Secondary | ICD-10-CM | POA: Diagnosis not present

## 2024-01-26 DIAGNOSIS — Z800 Family history of malignant neoplasm of digestive organs: Secondary | ICD-10-CM

## 2024-01-26 DIAGNOSIS — K625 Hemorrhage of anus and rectum: Secondary | ICD-10-CM | POA: Diagnosis not present

## 2024-01-26 DIAGNOSIS — K648 Other hemorrhoids: Secondary | ICD-10-CM | POA: Diagnosis not present

## 2024-01-26 DIAGNOSIS — K581 Irritable bowel syndrome with constipation: Secondary | ICD-10-CM

## 2024-01-26 DIAGNOSIS — K602 Anal fissure, unspecified: Secondary | ICD-10-CM | POA: Diagnosis not present

## 2024-01-26 DIAGNOSIS — Z860100 Personal history of colon polyps, unspecified: Secondary | ICD-10-CM

## 2024-01-26 MED ORDER — AMBULATORY NON FORMULARY MEDICATION: 1 refills | Status: DC

## 2024-01-26 NOTE — Progress Notes (Signed)
[name] [name]    [id number]    10-02-1966  Primary Care Physician:Hagler, Fleet Contras, MD  Referring Physician: Aliene Beams, MD [phone] [address] [place],  [place] [address]   Chief complaint: Rectal bleeding  Discussed the use of AI scribe software for clinical note transcription with the patient, who gave verbal consent to proceed.  History of Present Illness   [name] [name] is a 58 year old very pleasant female who presents with constipation and rectal bleeding.  She has experienced intermittent rectal bleeding over the past week, with episodes noted on Friday and possibly Monday. The bleeding is described as small amounts on the toilet paper, not severe or gushing, and she suspects it might be due to wiping too hard. No discomfort during wiping or bowel movements. A colonoscopy in 2022 revealed one adenomatous colon polyp and small hemorrhoids.  There is an overall improvement in constipation symptoms, but she experienced an episode of extreme pain while straining during a bowel movement on Sunday, described as feeling like her insides were going to fall out. This was the first occurrence in months. No recent changes in diet, medications, or travel that could have contributed to this episode.  She mentions significant stress in her life, including caring for her son, a recovering opiate user, her husband, a recovering alcoholic, and her grandson with special needs. She also manages her mother's bills and has a handicapped brother. Her back pain improved when her husband was away for treatment, suggesting a link between her stress levels and physical symptoms.  Her family history includes a grandmother who likely died of colon cancer and a best friend who died of pancreatic cancer after experiencing rectal bleeding.          Outpatient Encounter Medications as of 01/26/2024  Medication Sig   acetaminophen (TYLENOL) 500 MG tablet Take 1,000 mg by mouth every  6 (six) hours as needed for mild pain or headache.   AMBULATORY NON FORMULARY MEDICATION Medication Name: Nitroglycerin ointment 0.125% use pea sized amount per rectum three times a day for 6-8 weeks   amphetamine-dextroamphetamine (ADDERALL XR) 15 MG 24 hr capsule Take 1 capsule by mouth every morning. may fill 30 days after last refill (11/02/22)   amphetamine-dextroamphetamine (ADDERALL XR) 15 MG 24 hr capsule Take 1 capsule by mouth every morning.   amphetamine-dextroamphetamine (ADDERALL XR) 15 MG 24 hr capsule Take 1 capsule by mouth every morning.   amphetamine-dextroamphetamine (ADDERALL XR) 15 MG 24 hr capsule Take 1 capsule by mouth every morning.   amphetamine-dextroamphetamine (ADDERALL XR) 15 MG 24 hr capsule Take 1 capsule by mouth in the morning.   amphetamine-dextroamphetamine (ADDERALL XR) 15 MG 24 hr capsule Take 1 capsule by mouth in the morning.   amphetamine-dextroamphetamine (ADDERALL XR) 15 MG 24 hr capsule Take 1 capsule by mouth in the morning.   buPROPion (WELLBUTRIN XL) 300 MG 24 hr tablet Take 1 tablet by mouth once a day   buPROPion (WELLBUTRIN XL) 300 MG 24 hr tablet Take 1 tablet by mouth once a day.   buPROPion (WELLBUTRIN XL) 300 MG 24 hr tablet Take 1 tablet (300 mg total) by mouth daily.   buPROPion (WELLBUTRIN XL) 300 MG 24 hr tablet Take 1 tablet (300 mg total) by mouth daily.   estrogen-methylTESTOSTERone (ESTRATEST H.S.) 0.625-1.25 MG tablet Take 1/2 tablet by mouth once daily.   estrogen-methylTESTOSTERone 0.625-1.25 MG tablet Take 1/2 tablet by mouth with food  once a day for 90 days.   estrogen-methylTESTOSTERone 0.625-1.25 MG tablet Take 1/2 tablet once daily.   estrogen-methylTESTOSTERone 0.625-1.25 MG tablet Take 0.5 tablets by mouth daily with food   estrogen-methylTESTOSTERone 0.625-1.25 MG tablet Take 0.5 tablets by mouth daily with food.   FIBER ADULT GUMMIES PO Take 1 tablet by mouth daily.   linaclotide (LINZESS) 145 MCG CAPS capsule Take 1 capsule  (145 mcg total) by mouth daily before breakfast.   losartan-hydrochlorothiazide (HYZAAR) 100-12.5 MG tablet Take 1 tablet by mouth daily.   losartan-hydrochlorothiazide (HYZAAR) 100-12.5 MG tablet Take 1 tablet by mouth daily.   traZODone (DESYREL) 50 MG tablet Take 1 tablet (50 mg total) by mouth at bedtime.   [DISCONTINUED] amoxicillin (AMOXIL) 500 MG capsule Take 1 capsule (500 mg total) by mouth every 8 (eight) hours.   [DISCONTINUED] amoxicillin-clavulanate (AUGMENTIN) 500-125 MG tablet Take 1 tablet by mouth every 8 (eight) hours until finished   [DISCONTINUED] amphetamine-dextroamphetamine (ADDERALL XR) 15 MG 24 hr capsule Take 1 capsule by mouth in the morning by mouth (30 day supply) *Please fill 30 days after last fill   [DISCONTINUED] amphetamine-dextroamphetamine (ADDERALL XR) 15 MG 24 hr capsule Take 1 capsule by mouth in the morning ...may fill 30 days after last fill * Fill 09/16   [DISCONTINUED] amphetamine-dextroamphetamine (ADDERALL XR) 15 MG 24 hr capsule Take 1 capsule by mouth every morning.   No facility-administered encounter medications on file as of 01/26/2024.    Allergies as of 01/26/2024   (No Known Allergies)    Past Medical History:  Diagnosis Date   ADD (attention deficit disorder)    Anxiety    Cervical radiculopathy 2014   L arm (into L4th and 5th fingers)   Depression    Endometriosis    Hypertension    on meds   Interstitial cystitis    OA (osteoarthritis)    PONV (postoperative nausea and vomiting)    Right knee meniscal tear    Wears glasses     Past Surgical History:  Procedure Laterality Date   BLADDER SURGERY     due to interstitial cystitis   BREAST REDUCTION SURGERY  1988   CHONDROPLASTY Right 05/11/2019   Procedure: CHONDROPLASTY;  Surgeon: Ollen Gross, MD;  Location: [facility];  Service: Orthopedics;  Laterality: Right;   CLOSED MANIPULATION SHOULDER Right 11/15/2009   CYSTOSCOPY WITH HYDRODISTENSION AND BIOPSY   01-17-2004    dr Isabel Caprice  @[facility]    for IC   DIAGNOSTIC LAPAROSCOPY  1988;  05-28-2006   KNEE ARTHROSCOPY Bilateral right 1996;  left 11-02-2008   KNEE ARTHROSCOPY Right 05/11/2019   Procedure: ARTHROSCOPY KNEE, MEDIAL MENISCAL DEBRIDEMENT;  Surgeon: Ollen Gross, MD;  Location: [facility];  Service: Orthopedics;  Laterality: Right;   KNEE ARTHROSCOPY WITH MEDIAL MENISECTOMY Right 08/13/2018   Procedure: RIGHT KNEE ARTHROSCOPY WITH DEBRIDEMENT chondroplasty;  Surgeon: Kathryne Hitch, MD;  Location: [facility];  Service: Orthopedics;  Laterality: Right;   REDUCTION MAMMAPLASTY     SHOULDER ARTHROSCOPY Bilateral right 08-07-2009;  let 04-17-2011   TOTAL ABDOMINAL HYSTERECTOMY W/ BILATERAL SALPINGOOPHORECTOMY  05-19-2007   dr Sydnee Cabal  @[facility]     Family History  Problem Relation Age of Onset   Colon polyps Mother    Heart disease Father    Stroke Father    Mental illness Father    Mental illness Sister    Stroke Brother    Diabetes Brother    Hyperlipidemia Brother    Colon cancer Maternal Uncle  Colon polyps Maternal Grandmother    Colon cancer Maternal Grandmother    Breast cancer Neg Hx    Stomach cancer Neg Hx    Rectal cancer Neg Hx    Esophageal cancer Neg Hx    Pancreatic cancer Neg Hx     Social History   Socioeconomic History   Marital status: Married    Spouse name: Not on file   Number of children: 2   Years of education: Not on file   Highest education level: Not on file  Occupational History   Not on file  Tobacco Use   Smoking status: Former    Current packs/day: 0.00    Types: Cigarettes    Start date: 08/04/1986    Quit date: 08/04/1990    Years since quitting: 33.5   Smokeless tobacco: Never  Vaping Use   Vaping status: Never Used  Substance and Sexual Activity   Alcohol use: Yes    Alcohol/week: 2.0 standard drinks of alcohol    Types: 2 Standard drinks or equivalent per week    Comment: rare   Drug use: No   Sexual activity:  Yes    Birth control/protection: Surgical  Other Topics Concern   Not on file  Social History Narrative   Not on file   Social Drivers of Health   Financial Resource Strain: Not on file  Food Insecurity: Not on file  Transportation Needs: Not on file  Physical Activity: Not on file  Stress: Not on file  Social Connections: Not on file  Intimate Partner Violence: Not on file      Review of systems: All other review of systems negative except as mentioned in the HPI.   Physical Exam: Vitals:   01/26/24 0941  BP: 118/76  Pulse: 79   Body mass index is 28.15 kg/m. Gen:      No acute distress HEENT:  sclera anicteric Abd:      soft, non-tender; no palpable masses, no distension Ext:    No edema Neuro: alert and oriented x 3 Psych: normal mood and affect Rectal exam: Normal anal sphincter tone, + anal fissure, no external hemorrhoids Anoscopy: Small internal hemorrhoids, no active bleeding, normal dentate line, no visible nodules, + right anterior anal fissure  Data Reviewed:  Reviewed labs, radiology imaging, old records and pertinent past GI work up     Assessment and Plan    Constipation Significant improvement noted. Experienced an isolated episode of extreme pain during straining on Sunday. Intermittent rectal bleeding on wiping noted on Friday and Monday without associated discomfort during bowel movements secondary to anal fissure.  Anal fissure: Use 0.125% nitroglycerin small pea-sized amount per rectum 3-4 times daily for 2 to 3 months Add Benefiber 1 tablespoon 2-3 times daily with meals  Family history of colon cancer and a personal history of a colonoscopy in 2022 with one polyp removed and non-severe hemorrhoids noted. Explained that rectal bleeding could be due to fissure. If bleeding persists, a colonoscopy may be necessary. - Consider colonoscopy if bleeding persists or worsens  Chronic Back Pain Chronic back pain with significant improvement  during a period of reduced stress when her husband was in treatment. Stress appears to be a contributing factor.  General Health Maintenance Due for a follow-up colonoscopy in 2027. Family history of colon cancer noted. - Schedule follow-up colonoscopy in 2027 unless earlier indicated by symptoms.       Return in 2 to 3 months for follow-up to document healing of  anal fissure   The patient was provided an opportunity to ask questions and all were answered. The patient agreed with the plan and demonstrated an understanding of the instructions.  Iona Beard , MD    CC: Aliene Beams, MD

## 2024-01-26 NOTE — Telephone Encounter (Signed)
Patient called and stated that she came tpo her appointment today and was prescribed a medication called nitroglycerin. Patient stated that [facility] did not have a prescription for her. Patient is requesting a call back. Please advise.

## 2024-01-26 NOTE — Patient Instructions (Addendum)
We have sent a prescription for nitroglycerin 0.125% gel to [facility]. You should apply a pea size amount to your rectum three times daily x 6-8 weeks.  [facility]'s information is below: Address: [address] [place], [place] [address]  [phone]  *Please DO NOT go directly from our office to pick up this medication! Give the pharmacy 1 day to process the prescription as this is compounded and takes time to make. VISIT SUMMARY:  Today, we discussed your recent experiences with constipation and rectal bleeding, as well as your chronic back pain. We also reviewed your general health maintenance, including your family history of colon cancer and your previous colonoscopy results.  YOUR PLAN:  -ANAL FISSURE: Apply small pea size amount of nitroglycerine 0.125% per rectum 3-4 times daily for 2-3 months Use Benefiber 1 tablespoon 2-3 times daily with meals Follow up in GI office in 2-3 months for repeat exam to document healing of the anal fissure  -CONSTIPATION: Constipation is when you have infrequent or difficult bowel movements. You have shown significant improvement, but you experienced extreme pain while straining and intermittent rectal bleeding. This is due to an anal fissure.  If the bleeding continues, a colonoscopy may be necessary.  -CHRONIC BACK PAIN: Chronic back pain is long-lasting pain in your back. Your pain has improved during periods of reduced stress, suggesting that stress may be a contributing factor. Managing stress could help alleviate your symptoms.  -GENERAL HEALTH MAINTENANCE: You are due for a follow-up colonoscopy in 2027, given your family history of colon cancer and your previous colonoscopy results. Regular screenings are important for early detection and prevention.  INSTRUCTIONS:  Please schedule a rectal exam to assess for hemorrhoids or an anal fissure. If the rectal bleeding persists or worsens, we may need to schedule a  colonoscopy. Additionally, remember to schedule your follow-up colonoscopy in 2027 unless symptoms indicate an earlier need.

## 2024-01-27 NOTE — Telephone Encounter (Signed)
I left the patient a message that we should have given her a printed copy yesterday. We did print the rx of Nitroglycerin and Dr Lavon Paganini signed it. I Left message for the patient to check her paperwork ( After Visit Summery ) to make sure its not stapled to that. If she can't find it to call us back and let us know. And I can call it in or reprint it for her

## 2024-01-28 ENCOUNTER — Encounter: Payer: Self-pay | Admitting: Gastroenterology

## 2024-01-28 ENCOUNTER — Other Ambulatory Visit: Payer: Self-pay

## 2024-01-28 MED ORDER — AMBULATORY NON FORMULARY MEDICATION: 1 refills | Status: AC

## 2024-02-04 ENCOUNTER — Other Ambulatory Visit (HOSPITAL_COMMUNITY): Payer: Self-pay

## 2024-02-09 DIAGNOSIS — H5203 Hypermetropia, bilateral: Secondary | ICD-10-CM | POA: Diagnosis not present

## 2024-02-16 ENCOUNTER — Other Ambulatory Visit (HOSPITAL_COMMUNITY): Payer: Self-pay

## 2024-02-16 DIAGNOSIS — F339 Major depressive disorder, recurrent, unspecified: Secondary | ICD-10-CM | POA: Diagnosis not present

## 2024-02-16 DIAGNOSIS — I10 Essential (primary) hypertension: Secondary | ICD-10-CM | POA: Diagnosis not present

## 2024-02-16 DIAGNOSIS — Z79890 Hormone replacement therapy (postmenopausal): Secondary | ICD-10-CM | POA: Diagnosis not present

## 2024-02-16 DIAGNOSIS — G43009 Migraine without aura, not intractable, without status migrainosus: Secondary | ICD-10-CM | POA: Diagnosis not present

## 2024-02-16 DIAGNOSIS — F909 Attention-deficit hyperactivity disorder, unspecified type: Secondary | ICD-10-CM | POA: Diagnosis not present

## 2024-02-16 MED ORDER — TRAZODONE HCL 100 MG PO TABS
100.0000 mg | ORAL_TABLET | Freq: Every day | ORAL | 1 refills | Status: DC
  Filled 2024-02-16: qty 90, 90d supply, fill #0
  Filled 2024-05-13: qty 90, 90d supply, fill #1

## 2024-02-29 ENCOUNTER — Other Ambulatory Visit (HOSPITAL_COMMUNITY): Payer: Self-pay

## 2024-02-29 MED ORDER — AMPHETAMINE-DEXTROAMPHET ER 15 MG PO CP24
15.0000 mg | ORAL_CAPSULE | Freq: Every morning | ORAL | 0 refills | Status: DC
  Filled 2024-02-29: qty 90, 90d supply, fill #0

## 2024-03-01 ENCOUNTER — Other Ambulatory Visit: Payer: Self-pay

## 2024-03-03 DIAGNOSIS — F4322 Adjustment disorder with anxiety: Secondary | ICD-10-CM | POA: Diagnosis not present

## 2024-03-21 DIAGNOSIS — F4322 Adjustment disorder with anxiety: Secondary | ICD-10-CM | POA: Diagnosis not present

## 2024-03-30 ENCOUNTER — Other Ambulatory Visit (HOSPITAL_COMMUNITY): Payer: Self-pay

## 2024-03-30 MED ORDER — ESTRATEST H.S. 0.625-1.25 MG PO TABS
0.5000 | ORAL_TABLET | Freq: Every day | ORAL | 1 refills | Status: AC
Start: 2024-03-30 — End: ?
  Filled 2024-03-30: qty 45, 90d supply, fill #0
  Filled 2024-06-27: qty 45, 90d supply, fill #1

## 2024-04-01 ENCOUNTER — Other Ambulatory Visit (HOSPITAL_COMMUNITY): Payer: Self-pay

## 2024-04-01 ENCOUNTER — Encounter: Payer: Self-pay | Admitting: Pharmacist

## 2024-04-19 ENCOUNTER — Other Ambulatory Visit: Payer: Self-pay

## 2024-04-19 ENCOUNTER — Ambulatory Visit: Admitting: Family Medicine

## 2024-04-19 VITALS — BP 118/78 | HR 71 | Ht 64.0 in | Wt 161.0 lb

## 2024-04-19 DIAGNOSIS — M25511 Pain in right shoulder: Secondary | ICD-10-CM

## 2024-04-19 DIAGNOSIS — M25512 Pain in left shoulder: Secondary | ICD-10-CM | POA: Diagnosis not present

## 2024-04-19 DIAGNOSIS — M19011 Primary osteoarthritis, right shoulder: Secondary | ICD-10-CM | POA: Diagnosis not present

## 2024-04-19 DIAGNOSIS — M170 Bilateral primary osteoarthritis of knee: Secondary | ICD-10-CM

## 2024-04-19 DIAGNOSIS — M19012 Primary osteoarthritis, left shoulder: Secondary | ICD-10-CM

## 2024-04-19 NOTE — Assessment & Plan Note (Signed)
 Arthritic changes noted in left greater than left.  Any of this.  Discussed which activities to do and from this time forward.  Follow-up again in 6 to 8 weeks to decide if any other injection is needed.  Will try topical anti-inflammatories over-the-counter.  Has a trip to [place] and [place] in September she needs to be ready for.

## 2024-04-19 NOTE — Patient Instructions (Addendum)
 AC jt arthritis Exercises Keep working with [name] [name] Voltaren Approval for gel for next visit if needed Tart cherry extract at bedtime 1200mg  See me in 6 weeks

## 2024-04-19 NOTE — Progress Notes (Unsigned)
 [facility] [address] [place] [address] Phone: [phone] Subjective:   Delwyn Filippo, am serving as a scribe for Dr. Ronnell Coins.  I'm seeing this patient by the request  of:  Dorena Gander, MD  CC: Bilateral shoulder pain  BJY:[id number]  QUIN BLANKINSHIP is a 58 y.o. female coming in with complaint of B shoulder pain. Hx of R shoulder surgery in 2012. L shoulder surgery in 2013. Had to have a second surgery on R shoulder to release adhesions. R>L. Shoulder will lock and she gets shooting pain in anterior shoulder that has not pattern to when it presents. Patient states that she does pilates with modification. Able to do pushups without pain. L shoulder is uncomfortable when she lies on her side at night.      Past Medical History:  Diagnosis Date   ADD (attention deficit disorder)    Anxiety    Cervical radiculopathy 2014   L arm (into L4th and 5th fingers)   Depression    Endometriosis    Hypertension    on meds   Interstitial cystitis    OA (osteoarthritis)    PONV (postoperative nausea and vomiting)    Right knee meniscal tear    Wears glasses    Past Surgical History:  Procedure Laterality Date   BLADDER SURGERY     due to interstitial cystitis   BREAST REDUCTION SURGERY  1988   CHONDROPLASTY Right 05/11/2019   Procedure: CHONDROPLASTY;  Surgeon: Liliane Rei, MD;  Location: [facility];  Service: Orthopedics;  Laterality: Right;   CLOSED MANIPULATION SHOULDER Right 11/15/2009   CYSTOSCOPY WITH HYDRODISTENSION AND BIOPSY  01-17-2004    dr Bosie Bye  @[facility]    for IC   DIAGNOSTIC LAPAROSCOPY  1988;  05-28-2006   KNEE ARTHROSCOPY Bilateral right 1996;  left 11-02-2008   KNEE ARTHROSCOPY Right 05/11/2019   Procedure: ARTHROSCOPY KNEE, MEDIAL MENISCAL DEBRIDEMENT;  Surgeon: Liliane Rei, MD;  Location: [facility];  Service: Orthopedics;  Laterality: Right;   KNEE ARTHROSCOPY WITH MEDIAL  MENISECTOMY Right 08/13/2018   Procedure: RIGHT KNEE ARTHROSCOPY WITH DEBRIDEMENT chondroplasty;  Surgeon: Arnie Lao, MD;  Location: [facility];  Service: Orthopedics;  Laterality: Right;   REDUCTION MAMMAPLASTY     SHOULDER ARTHROSCOPY Bilateral right 08-07-2009;  let 04-17-2011   TOTAL ABDOMINAL HYSTERECTOMY W/ BILATERAL SALPINGOOPHORECTOMY  05-19-2007   dr Evalene Hilda  @[facility]    Social History   Socioeconomic History   Marital status: Married    Spouse name: Not on file   Number of children: 2   Years of education: Not on file   Highest education level: Not on file  Occupational History   Not on file  Tobacco Use   Smoking status: Former    Current packs/day: 0.00    Types: Cigarettes    Start date: 08/04/1986    Quit date: 08/04/1990    Years since quitting: 33.7   Smokeless tobacco: Never  Vaping Use   Vaping status: Never Used  Substance and Sexual Activity   Alcohol use: Yes    Alcohol/week: 2.0 standard drinks of alcohol    Types: 2 Standard drinks or equivalent per week    Comment: rare   Drug use: No   Sexual activity: Yes    Birth control/protection: Surgical  Other Topics Concern   Not on file  Social History Narrative   Not on file   Social Drivers of Health   Financial Resource Strain:  Not on file  Food Insecurity: Not on file  Transportation Needs: Not on file  Physical Activity: Not on file  Stress: Not on file  Social Connections: Not on file   No Known Allergies Family History  Problem Relation Age of Onset   Colon polyps Mother    Heart disease Father    Stroke Father    Mental illness Father    Mental illness Sister    Stroke Brother    Diabetes Brother    Hyperlipidemia Brother    Colon cancer Maternal Uncle    Colon polyps Maternal Grandmother    Colon cancer Maternal Grandmother    Breast cancer Neg Hx    Stomach cancer Neg Hx    Rectal cancer Neg Hx    Esophageal cancer Neg Hx    Pancreatic cancer Neg Hx     Current  Outpatient Medications (Endocrine & Metabolic):    estrogen-methylTESTOSTERone  (ESTRATEST H.S.) 0.625-1.25 MG tablet, Take 1/2 tablet by mouth once daily.   estrogen-methylTESTOSTERone  (ESTRATEST H.S.) 0.625-1.25 MG tablet, Take 0.5 tablets by mouth daily.   estrogen-methylTESTOSTERone  0.625-1.25 MG tablet, Take 1/2 tablet by mouth with food once a day for 90 days.   estrogen-methylTESTOSTERone  0.625-1.25 MG tablet, Take 1/2 tablet once daily.   estrogen-methylTESTOSTERone  0.625-1.25 MG tablet, Take 0.5 tablets by mouth daily with food   estrogen-methylTESTOSTERone  0.625-1.25 MG tablet, Take 0.5 tablets by mouth daily with food.  Current Outpatient Medications (Cardiovascular):    losartan -hydrochlorothiazide  (HYZAAR ) 100-12.5 MG tablet, Take 1 tablet by mouth daily.   losartan -hydrochlorothiazide  (HYZAAR ) 100-12.5 MG tablet, Take 1 tablet by mouth daily.   Current Outpatient Medications (Analgesics):    acetaminophen  (TYLENOL ) 500 MG tablet, Take 1,000 mg by mouth every 6 (six) hours as needed for mild pain or headache.   Current Outpatient Medications (Other):    AMBULATORY NON FORMULARY MEDICATION, Medication Name: Nitroglycerin ointment 0.125% use pea sized amount per rectum three times a day for 6-8 weeks   amphetamine -dextroamphetamine  (ADDERALL  XR) 15 MG 24 hr capsule, Take 1 capsule by mouth every morning. may fill 30 days after last refill (11/02/22)   amphetamine -dextroamphetamine  (ADDERALL  XR) 15 MG 24 hr capsule, Take 1 capsule by mouth every morning.   amphetamine -dextroamphetamine  (ADDERALL  XR) 15 MG 24 hr capsule, Take 1 capsule by mouth every morning.   amphetamine -dextroamphetamine  (ADDERALL  XR) 15 MG 24 hr capsule, Take 1 capsule by mouth every morning.   amphetamine -dextroamphetamine  (ADDERALL  XR) 15 MG 24 hr capsule, Take 1 capsule by mouth in the morning.   amphetamine -dextroamphetamine  (ADDERALL  XR) 15 MG 24 hr capsule, Take 1 capsule by mouth in the morning.    amphetamine -dextroamphetamine  (ADDERALL  XR) 15 MG 24 hr capsule, Take 1 capsule by mouth in the morning.   amphetamine -dextroamphetamine  (ADDERALL  XR) 15 MG 24 hr capsule, Take 1 capsule by mouth in the morning.   buPROPion  (WELLBUTRIN  XL) 300 MG 24 hr tablet, Take 1 tablet by mouth once a day   buPROPion  (WELLBUTRIN  XL) 300 MG 24 hr tablet, Take 1 tablet by mouth once a day.   buPROPion  (WELLBUTRIN  XL) 300 MG 24 hr tablet, Take 1 tablet (300 mg total) by mouth daily.   buPROPion  (WELLBUTRIN  XL) 300 MG 24 hr tablet, Take 1 tablet (300 mg total) by mouth daily.   FIBER ADULT GUMMIES PO, Take 1 tablet by mouth daily.   linaclotide  (LINZESS ) 145 MCG CAPS capsule, Take 1 capsule (145 mcg total) by mouth daily before breakfast.   traZODone  (DESYREL ) 100 MG tablet, Take 1 tablet (  100 mg total) by mouth at bedtime.   traZODone  (DESYREL ) 50 MG tablet, Take 1 tablet (50 mg total) by mouth at bedtime.   Reviewed prior external information including notes and imaging from  primary care provider As well as notes that were available from care everywhere and other healthcare systems.  Past medical history, social, surgical and family history all reviewed in electronic medical record.  No pertanent information unless stated regarding to the chief complaint.   Review of Systems:  No headache, visual changes, nausea, vomiting, diarrhea, constipation, dizziness, abdominal pain, skin rash, fevers, chills, night sweats, weight loss, swollen lymph nodes, body aches, joint swelling, chest pain, shortness of breath, mood changes. POSITIVE muscle aches  Objective  There were no vitals taken for this visit.   General: No apparent distress alert and oriented x3 mood and affect normal, dressed appropriately.  HEENT: Pupils equal, extraocular movements intact  Respiratory: Patient's speak in full sentences and does not appear short of breath  Cardiovascular: No lower extremity edema, non tender, no erythema   Bilateral shoulder exam shows   Limited muscular skeletal ultrasound was performed and interpreted by Ronnell Coins, M     Impression and Recommendations:     The above documentation has been reviewed and is accurate and complete Jocsan Mcginley M Prerna Harold, DO

## 2024-04-19 NOTE — Assessment & Plan Note (Signed)
 Arthritic changes noted of the knees bilaterally.  Fairly severe overall.  Has had surgical intervention.  Will see if she is a candidate for viscosupplementation and we will try to get approval.  Would like to get her to at least 62 if not 58 years of age before any type of replacement is done.

## 2024-04-20 ENCOUNTER — Encounter: Payer: Self-pay | Admitting: Family Medicine

## 2024-04-22 ENCOUNTER — Telehealth: Payer: Self-pay

## 2024-04-22 NOTE — Telephone Encounter (Signed)
 Patient has an appointment on 06/15/2024. Medication needs to be ordered.   Monovisc approved for bilateral knees.  Fax received with approval from [place].  Case Number: [id number] Service Dates: 04/21/2024 - 04/20/2025

## 2024-04-25 NOTE — Telephone Encounter (Signed)
 noted

## 2024-04-26 ENCOUNTER — Ambulatory Visit: Payer: Commercial Managed Care - PPO | Admitting: Gastroenterology

## 2024-05-05 ENCOUNTER — Other Ambulatory Visit (HOSPITAL_COMMUNITY): Payer: Self-pay

## 2024-05-30 ENCOUNTER — Other Ambulatory Visit (HOSPITAL_COMMUNITY): Payer: Self-pay

## 2024-05-31 ENCOUNTER — Ambulatory Visit: Admitting: Family Medicine

## 2024-05-31 ENCOUNTER — Other Ambulatory Visit (HOSPITAL_COMMUNITY): Payer: Self-pay

## 2024-05-31 MED ORDER — AMPHETAMINE-DEXTROAMPHET ER 15 MG PO CP24
15.0000 mg | ORAL_CAPSULE | Freq: Every morning | ORAL | 0 refills | Status: AC
  Filled 2024-05-31: qty 90, 90d supply, fill #0

## 2024-06-02 DIAGNOSIS — F4322 Adjustment disorder with anxiety: Secondary | ICD-10-CM | POA: Diagnosis not present

## 2024-06-03 ENCOUNTER — Other Ambulatory Visit (HOSPITAL_COMMUNITY): Payer: Self-pay

## 2024-06-13 NOTE — Progress Notes (Unsigned)
 [name] [address] [place] [address] Phone: [phone] Subjective:   [name] [name], am serving as a scribe for Dr. Arthea Claudene.  I'm seeing this patient by the request  of:  Rolinda Millman, MD  CC: Bilateral knee pain  YEP:Dlagzrupcz  04/19/2024 Arthritic changes noted of the knees bilaterally. Fairly severe overall. Has had surgical intervention. Will see if she is a candidate for viscosupplementation and we will try to get approval. Would like to get her to at least 62 if not 58 years of age before any type of replacement is done.  Arthritic changes noted in left greater than left. Any of this. Discussed which activities to do and from this time forward. Follow-up again in 6 to 8 weeks to decide if any other injection is needed. Will try topical anti-inflammatories over-the-counter. Has a trip to [place] and [place] in September she needs to be ready for.   Updated 06/15/2024 [name] [name] is a 58 y.o. female coming in with complaint of B knee pain. Here to discuss the possibility for gel injections. L knee for certain. R knee wants opinion.       Past Medical History:  Diagnosis Date   ADD (attention deficit disorder)    Anxiety    Cervical radiculopathy 2014   L arm (into L4th and 5th fingers)   Depression    Endometriosis    Hypertension    on meds   Interstitial cystitis    OA (osteoarthritis)    PONV (postoperative nausea and vomiting)    Right knee meniscal tear    Wears glasses    Past Surgical History:  Procedure Laterality Date   BLADDER SURGERY     due to interstitial cystitis   BREAST REDUCTION SURGERY  1988   CHONDROPLASTY Right 05/11/2019   Procedure: CHONDROPLASTY;  Surgeon: Melodi Lerner, MD;  Location[facility];  Service: Orthopedics;  Laterality: Right;   CLOSED MANIPULATION SHOULDER Right 11/15/2009   CYSTOSCOPY WITH HYDRODISTENSION AND BIOPSY  01-17-2004    dr alline  @[facility]    for IC    DIAGNOSTIC LAPAROSCOPY  1988;  05-28-2006   KNEE ARTHROSCOPY Bilateral right 1996;  left 11-02-2008   KNEE ARTHROSCOPY Right 05/11/2019   Procedure: ARTHROSCOPY KNEE, MEDIAL MENISCAL DEBRIDEMENT;  Surgeon: Melodi Lerner, MD;  Location: [facility];  Service: Orthopedics;  Laterality: Right;   KNEE ARTHROSCOPY WITH MEDIAL MENISECTOMY Right 08/13/2018   Procedure: RIGHT KNEE ARTHROSCOPY WITH DEBRIDEMENT chondroplasty;  Surgeon: Vernetta Lonni GRADE, MD;  Location: [facility];  Service: Orthopedics;  Laterality: Right;   REDUCTION MAMMAPLASTY     SHOULDER ARTHROSCOPY Bilateral right 08-07-2009;  let 04-17-2011   TOTAL ABDOMINAL HYSTERECTOMY W/ BILATERAL SALPINGOOPHORECTOMY  05-19-2007   dr jannetta  @[facility]    Social History   Socioeconomic History   Marital status: Married    Spouse name: Not on file   Number of children: 2   Years of education: Not on file   Highest education level: Not on file  Occupational History   Not on file  Tobacco Use   Smoking status: Former    Current packs/day: 0.00    Types: Cigarettes    Start date: 08/04/1986    Quit date: 08/04/1990    Years since quitting: 33.8   Smokeless tobacco: Never  Vaping Use   Vaping status: Never Used  Substance and Sexual Activity   Alcohol use: Yes    Alcohol/week: 2.0 standard drinks of alcohol  Types: 2 Standard drinks or equivalent per week    Comment: rare   Drug use: No   Sexual activity: Yes    Birth control/protection: Surgical  Other Topics Concern   Not on file  Social History Narrative   Not on file   Social Drivers of Health   Financial Resource Strain: Not on file  Food Insecurity: Not on file  Transportation Needs: Not on file  Physical Activity: Not on file  Stress: Not on file  Social Connections: Not on file   No Known Allergies Family History  Problem Relation Age of Onset   Colon polyps Mother    Heart disease Father    Stroke Father    Mental illness Father    Mental  illness Sister    Stroke Brother    Diabetes Brother    Hyperlipidemia Brother    Colon cancer Maternal Uncle    Colon polyps Maternal Grandmother    Colon cancer Maternal Grandmother    Breast cancer Neg Hx    Stomach cancer Neg Hx    Rectal cancer Neg Hx    Esophageal cancer Neg Hx    Pancreatic cancer Neg Hx     Current Outpatient Medications (Endocrine & Metabolic):    estrogen-methylTESTOSTERone  (ESTRATEST  H.S.) 0.625-1.25 MG tablet, Take 1/2 tablet by mouth once daily.   estrogen-methylTESTOSTERone  (ESTRATEST  H.S.) 0.625-1.25 MG tablet, Take 0.5 tablets by mouth daily.   estrogen-methylTESTOSTERone  0.625-1.25 MG tablet, Take 1/2 tablet by mouth with food once a day for 90 days.   estrogen-methylTESTOSTERone  0.625-1.25 MG tablet, Take 1/2 tablet once daily.   estrogen-methylTESTOSTERone  0.625-1.25 MG tablet, Take 0.5 tablets by mouth daily with food   estrogen-methylTESTOSTERone  0.625-1.25 MG tablet, Take 0.5 tablets by mouth daily with food.   Current Outpatient Medications (Cardiovascular):    losartan -hydrochlorothiazide  (HYZAAR ) 100-12.5 MG tablet, Take 1 tablet by mouth daily.   losartan -hydrochlorothiazide  (HYZAAR ) 100-12.5 MG tablet, Take 1 tablet by mouth daily.     Current Outpatient Medications (Analgesics):    acetaminophen  (TYLENOL ) 500 MG tablet, Take 1,000 mg by mouth every 6 (six) hours as needed for mild pain or headache.     Current Outpatient Medications (Other):    AMBULATORY NON FORMULARY MEDICATION, Medication Name: Nitroglycerin ointment 0.125% use pea sized amount per rectum three times a day for 6-8 weeks   amphetamine -dextroamphetamine  (ADDERALL  XR) 15 MG 24 hr capsule, Take 1 capsule by mouth every morning. may fill 30 days after last refill (11/02/22)   amphetamine -dextroamphetamine  (ADDERALL  XR) 15 MG 24 hr capsule, Take 1 capsule by mouth every morning.   amphetamine -dextroamphetamine  (ADDERALL  XR) 15 MG 24 hr capsule, Take 1 capsule by mouth  every morning.   amphetamine -dextroamphetamine  (ADDERALL  XR) 15 MG 24 hr capsule, Take 1 capsule by mouth every morning.   amphetamine -dextroamphetamine  (ADDERALL  XR) 15 MG 24 hr capsule, Take 1 capsule by mouth in the morning.   amphetamine -dextroamphetamine  (ADDERALL  XR) 15 MG 24 hr capsule, Take 1 capsule by mouth in the morning.   amphetamine -dextroamphetamine  (ADDERALL  XR) 15 MG 24 hr capsule, Take 1 capsule by mouth in the morning.   amphetamine -dextroamphetamine  (ADDERALL  XR) 15 MG 24 hr capsule, Take 1 capsule by mouth in the morning.   buPROPion  (WELLBUTRIN  XL) 300 MG 24 hr tablet, Take 1 tablet by mouth once a day   buPROPion  (WELLBUTRIN  XL) 300 MG 24 hr tablet, Take 1 tablet by mouth once a day.   buPROPion  (WELLBUTRIN  XL) 300 MG 24 hr tablet, Take 1 tablet (300 mg total)  by mouth daily.   buPROPion  (WELLBUTRIN  XL) 300 MG 24 hr tablet, Take 1 tablet (300 mg total) by mouth daily.   FIBER ADULT GUMMIES PO, Take 1 tablet by mouth daily.   linaclotide  (LINZESS ) 145 MCG CAPS capsule, Take 1 capsule (145 mcg total) by mouth daily before breakfast.   traZODone  (DESYREL ) 100 MG tablet, Take 1 tablet (100 mg total) by mouth at bedtime.   traZODone  (DESYREL ) 50 MG tablet, Take 1 tablet (50 mg total) by mouth at bedtime.  Current Facility-Administered Medications (Other):    Hyaluronan (MONOVISC) intra-articular injection 176 mg   Reviewed prior external information including notes and imaging from  primary care provider As well as notes that were available from care everywhere and other healthcare systems.  Past medical history, social, surgical and family history all reviewed in electronic medical record.  No pertanent information unless stated regarding to the chief complaint.   Review of Systems:  No headache, visual changes, nausea, vomiting, diarrhea, constipation, dizziness, abdominal pain, skin rash, fevers, chills, night sweats, weight loss, swollen lymph nodes, body aches, joint  swelling, chest pain, shortness of breath, mood changes. POSITIVE muscle aches  Objective  Blood pressure 118/84, pulse 75, height 5' 4 (1.626 m), weight 160 lb (72.6 kg), SpO2 96%.   General: No apparent distress alert and oriented x3 mood and affect normal, dressed appropriately.  HEENT: Pupils equal, extraocular movements intact  Respiratory: Patient's speak in full sentences and does not appear short of breath  Cardiovascular: No lower extremity edema, non tender, no erythema  Shoulder exam shows Knee exam shows tightness noted.  Tender to palpation diffusely.  Mildly with some varus force  After informed written and verbal consent, patient was seated on exam table. Right knee was prepped with alcohol swab and utilizing anterolateral approach, patient's right knee space was injected with 48 mg per 3 mL of Monovisc (sodium hyaluronate) in a prefilled syringe was injected easily into the knee through a 22-gauge needle..Patient tolerated the procedure well without immediate complications.  After informed written and verbal consent, patient was seated on exam table. Left knee was prepped with alcohol swab and utilizing anterolateral approach, patient's left knee space was injected with 48 mg per 3 mL of Monovisc (sodium hyaluronate) in a prefilled syringe was injected easily into the knee through a 22-gauge needle..Patient tolerated the procedure well without immediate complications.    Impression and Recommendations:    The above documentation has been reviewed and is accurate and complete Sweta Halseth M Lashaundra Lehrmann, DO

## 2024-06-15 ENCOUNTER — Ambulatory Visit: Admitting: Family Medicine

## 2024-06-15 ENCOUNTER — Encounter: Payer: Self-pay | Admitting: Family Medicine

## 2024-06-15 VITALS — BP 118/84 | HR 75 | Ht 64.0 in | Wt 160.0 lb

## 2024-06-15 DIAGNOSIS — M170 Bilateral primary osteoarthritis of knee: Secondary | ICD-10-CM | POA: Diagnosis not present

## 2024-06-15 MED ORDER — HYALURONAN 88 MG/4ML IX SOSY: 176.0000 mg | PREFILLED_SYRINGE | Freq: Once | INTRA_ARTICULAR | Status: AC

## 2024-06-15 NOTE — Patient Instructions (Signed)
 Monovisc injections today PRP handout See me again in 2 months

## 2024-06-15 NOTE — Assessment & Plan Note (Signed)
 Patient given injection and tolerated the procedure well, discussed icing regimen and home exercises, increase activity slowly.  Discussed icing regimen.  Follow-up again in 7 to 8 weeks.  Patient wants to avoid any type of surgical intervention and hopefully well for quite some time

## 2024-06-27 ENCOUNTER — Other Ambulatory Visit (HOSPITAL_COMMUNITY): Payer: Self-pay

## 2024-06-27 ENCOUNTER — Other Ambulatory Visit: Payer: Self-pay

## 2024-06-27 DIAGNOSIS — R109 Unspecified abdominal pain: Secondary | ICD-10-CM | POA: Diagnosis not present

## 2024-06-27 DIAGNOSIS — K5900 Constipation, unspecified: Secondary | ICD-10-CM | POA: Diagnosis not present

## 2024-06-27 DIAGNOSIS — M545 Low back pain, unspecified: Secondary | ICD-10-CM | POA: Diagnosis not present

## 2024-06-27 MED ORDER — CYCLOBENZAPRINE HCL 5 MG PO TABS
5.0000 mg | ORAL_TABLET | Freq: Two times a day (BID) | ORAL | 0 refills | Status: AC | PRN
  Filled 2024-06-27: qty 10, 5d supply, fill #0

## 2024-06-28 ENCOUNTER — Other Ambulatory Visit (HOSPITAL_COMMUNITY): Payer: Self-pay

## 2024-06-28 DIAGNOSIS — R109 Unspecified abdominal pain: Secondary | ICD-10-CM | POA: Diagnosis not present

## 2024-06-28 DIAGNOSIS — K5900 Constipation, unspecified: Secondary | ICD-10-CM | POA: Diagnosis not present

## 2024-06-28 DIAGNOSIS — M545 Low back pain, unspecified: Secondary | ICD-10-CM | POA: Diagnosis not present

## 2024-07-14 DIAGNOSIS — F4322 Adjustment disorder with anxiety: Secondary | ICD-10-CM | POA: Diagnosis not present

## 2024-07-20 ENCOUNTER — Encounter: Payer: Self-pay | Admitting: Orthopedic Surgery

## 2024-07-20 ENCOUNTER — Other Ambulatory Visit (INDEPENDENT_AMBULATORY_CARE_PROVIDER_SITE_OTHER): Payer: Self-pay

## 2024-07-20 ENCOUNTER — Ambulatory Visit: Admitting: Orthopedic Surgery

## 2024-07-20 ENCOUNTER — Other Ambulatory Visit: Payer: Self-pay

## 2024-07-20 DIAGNOSIS — M7501 Adhesive capsulitis of right shoulder: Secondary | ICD-10-CM

## 2024-07-20 DIAGNOSIS — M25511 Pain in right shoulder: Secondary | ICD-10-CM | POA: Diagnosis not present

## 2024-07-20 NOTE — Progress Notes (Signed)
 Office Visit Note   Patient: [name] [name]           Date of Birth: [birth year]           MRN: [id number] Visit Date: 07/20/2024 Requested by: Rolinda Millman, MD [phone] [place],  [place] [address] PCP: Rolinda Millman, MD  Subjective: Chief Complaint  Patient presents with   Right Shoulder - Pain    HPI: [name] [name] is a 58 y.o. female who presents to the office reporting right shoulder pain.  Denies any history of injury.  Previous bilateral shoulder surgery with Dr. Anderson.  She has been doing physical therapy exercises with a physical therapist at her fitness facility.  Takes trazodone  at night.  Patient does do Pilates and does push-ups but she states she is not really sleeping well.  She has had an ultrasound examination which did show some soft tissue pathology by report.  Denies any radicular pain or numbness and tingling..                ROS: All systems reviewed are negative as they relate to the chief complaint within the history of present illness.  Patient denies fevers or chills.  Assessment & Plan: Visit Diagnoses:  1. Right shoulder pain, unspecified chronicity     Plan: Impression is likely early adhesive capsulitis.  Glenohumeral joint injection performed today.  She did have a vasovagal response to that but recovered.  Right shoulder does have some asymmetry with passive range of motion but very good rotator cuff strength and no coarseness or weakness.  Injection performed today under ultrasound guidance was Marcaine  plus Toradol .  Will see what the MRI scan shows in terms of structural pathology which was suggested by the ultrasound but not really confirmed on exam or history.  Follow-up after the scan.  Follow-Up Instructions: No follow-ups on file.   Orders:  Orders Placed This Encounter  Procedures   XR Shoulder Right   US  Guided Needle Placement - No Linked Charges   MR SHOULDER RIGHT W CONTRAST   Arthrogram   No orders of the  defined types were placed in this encounter.     Procedures: Large Joint Inj: R glenohumeral on 07/20/2024 8:36 PM Indications: diagnostic evaluation and pain Details: 22 G 3.5 in needle, ultrasound-guided posterior approach  Arthrogram: No  Medications: 9 mL bupivacaine  0.5 %; 5 mL lidocaine  1 %; 40 mg triamcinolone  acetonide 40 MG/ML Outcome: tolerated well, no immediate complications Procedure, treatment alternatives, risks and benefits explained, specific risks discussed. Consent was given by the patient. Immediately prior to procedure a time out was called to verify the correct patient, procedure, equipment, support staff and site/side marked as required. Patient was prepped and draped in the usual sterile fashion.       Clinical Data: No additional findings.  Objective: Vital Signs: There were no vitals taken for this visit.  Physical Exam:  Constitutional: Patient appears well-developed HEENT:  Head: Normocephalic Eyes:EOM are normal Neck: Normal range of motion Cardiovascular: Normal rate Pulmonary/chest: Effort normal Neurologic: Patient is alert Skin: Skin is warm Psychiatric: Patient has normal mood and affect  Ortho Exam: Ortho exam demonstrates passive range of motion on the right of 40/95/160.  Passive range of motion of the left is 55/100/170.  Patient has excellent rotator cuff strength infraspinatus supraspinatus and subscap muscle testing.  No coarse grinding or crepitus with internal/external rotation of that right arm.  No discrete AC joint tenderness on that  right-hand side.  Cervical spine range of motion full.  Specialty Comments:  No specialty comments available.  Imaging: No results found.   PMFS History: Patient Active Problem List   Diagnosis Date Noted   Arthritis of both acromioclavicular joints 04/19/2024   Degenerative arthritis of knee, bilateral 04/19/2024   Derangement of posterior horn of medial meniscus due to old tear or injury,  right knee 05/13/2018   Chronic pain of right knee 03/15/2018   Chronic pain of left knee 03/15/2018   Unilateral primary osteoarthritis, left knee 03/15/2018   Unilateral primary osteoarthritis, right knee 03/15/2018   H/O shoulder surgery    Constipation 11/28/2015   Family history of colon cancer 11/28/2015   Rectal bleeding 11/28/2015   Low back pain 11/28/2015   Dysthymia 08/04/2012   ADD (attention deficit disorder) 08/04/2012   Sleep disturbance 08/04/2012   IC (interstitial cystitis) 08/04/2012   H/O hysterectomy for benign disease 04/07/2012   Past Medical History:  Diagnosis Date   ADD (attention deficit disorder)    Anxiety    Cervical radiculopathy 2014   L arm (into L4th and 5th fingers)   Depression    Endometriosis    Hypertension    on meds   Interstitial cystitis    OA (osteoarthritis)    PONV (postoperative nausea and vomiting)    Right knee meniscal tear    Wears glasses     Family History  Problem Relation Age of Onset   Colon polyps Mother    Heart disease Father    Stroke Father    Mental illness Father    Mental illness Sister    Stroke Brother    Diabetes Brother    Hyperlipidemia Brother    Colon cancer Maternal Uncle    Colon polyps Maternal Grandmother    Colon cancer Maternal Grandmother    Breast cancer Neg Hx    Stomach cancer Neg Hx    Rectal cancer Neg Hx    Esophageal cancer Neg Hx    Pancreatic cancer Neg Hx     Past Surgical History:  Procedure Laterality Date   BLADDER SURGERY     due to interstitial cystitis   BREAST REDUCTION SURGERY  1988   CHONDROPLASTY Right 05/11/2019   Procedure: CHONDROPLASTY;  Surgeon: Melodi Lerner, MD;  Location: [facility];  Service: Orthopedics;  Laterality: Right;   CLOSED MANIPULATION SHOULDER Right 11/15/2009   CYSTOSCOPY WITH HYDRODISTENSION AND BIOPSY  01-17-2004    dr alline  @[facility]    for IC   DIAGNOSTIC LAPAROSCOPY  1988;  05-28-2006   KNEE ARTHROSCOPY Bilateral right  1996;  left 11-02-2008   KNEE ARTHROSCOPY Right 05/11/2019   Procedure: ARTHROSCOPY KNEE, MEDIAL MENISCAL DEBRIDEMENT;  Surgeon: Melodi Lerner, MD;  Location: [facility];  Service: Orthopedics;  Laterality: Right;   KNEE ARTHROSCOPY WITH MEDIAL MENISECTOMY Right 08/13/2018   Procedure: RIGHT KNEE ARTHROSCOPY WITH DEBRIDEMENT chondroplasty;  Surgeon: Vernetta Lonni GRADE, MD;  Location: [facility];  Service: Orthopedics;  Laterality: Right;   REDUCTION MAMMAPLASTY     SHOULDER ARTHROSCOPY Bilateral right 08-07-2009;  let 04-17-2011   TOTAL ABDOMINAL HYSTERECTOMY W/ BILATERAL SALPINGOOPHORECTOMY  05-19-2007   dr jannetta  @[facility]    Social History   Occupational History   Not on file  Tobacco Use   Smoking status: Former    Current packs/day: 0.00    Types: Cigarettes    Start date: 08/04/1986    Quit date: 08/04/1990    Years since quitting: 60.9  Smokeless tobacco: Never  Vaping Use   Vaping status: Never Used  Substance and Sexual Activity   Alcohol use: Yes    Alcohol/week: 2.0 standard drinks of alcohol    Types: 2 Standard drinks or equivalent per week    Comment: rare   Drug use: No   Sexual activity: Yes    Birth control/protection: Surgical

## 2024-07-21 ENCOUNTER — Encounter: Payer: Self-pay | Admitting: Orthopedic Surgery

## 2024-07-21 ENCOUNTER — Telehealth: Payer: Self-pay

## 2024-07-21 NOTE — Telephone Encounter (Signed)
 VOB submitted for Monovisc, bilateral knee

## 2024-07-22 ENCOUNTER — Telehealth: Payer: Self-pay | Admitting: Orthopedic Surgery

## 2024-07-22 NOTE — Telephone Encounter (Signed)
 Patient called and was asking for something for when she has her MRI done on the 13th to keep her calm. CB#[phone] She wants it to go to [facility] pharmacy.

## 2024-07-23 MED ORDER — TRIAMCINOLONE ACETONIDE 40 MG/ML IJ SUSP
40.0000 mg | INTRAMUSCULAR | Status: AC | PRN
  Administered 2024-07-20: 40 mg via INTRA_ARTICULAR

## 2024-07-23 MED ORDER — LIDOCAINE HCL 1 % IJ SOLN
5.0000 mL | INTRAMUSCULAR | Status: AC | PRN
  Administered 2024-07-20: 5 mL

## 2024-07-23 MED ORDER — BUPIVACAINE HCL 0.5 % IJ SOLN
9.0000 mL | INTRAMUSCULAR | Status: AC | PRN
  Administered 2024-07-20: 9 mL via INTRA_ARTICULAR

## 2024-07-25 ENCOUNTER — Telehealth: Payer: Self-pay | Admitting: Orthopedic Surgery

## 2024-07-25 NOTE — Telephone Encounter (Signed)
 Patient called and said she needs her Valium  before wednesday for her MRI. CB#[phone]

## 2024-07-26 ENCOUNTER — Telehealth: Payer: Self-pay | Admitting: Orthopedic Surgery

## 2024-07-26 ENCOUNTER — Telehealth: Payer: Self-pay

## 2024-07-26 ENCOUNTER — Other Ambulatory Visit: Payer: Self-pay | Admitting: Orthopedic Surgery

## 2024-07-26 ENCOUNTER — Other Ambulatory Visit (HOSPITAL_COMMUNITY): Payer: Self-pay

## 2024-07-26 MED ORDER — DIAZEPAM 5 MG PO TABS
5.0000 mg | ORAL_TABLET | Freq: Two times a day (BID) | ORAL | 0 refills | Status: AC | PRN
  Filled 2024-07-26: qty 6, 3d supply, fill #0

## 2024-07-26 NOTE — Telephone Encounter (Signed)
 Sent thx

## 2024-07-26 NOTE — Telephone Encounter (Signed)
 Patient called and needs a Valium  for the MRI tomorrow. CB#[phone]

## 2024-07-27 ENCOUNTER — Ambulatory Visit
Admission: RE | Admit: 2024-07-27 | Discharge: 2024-07-27 | Disposition: A | Discharge status: Home / Self Care | Source: Ambulatory Visit | Attending: Orthopedic Surgery | Admitting: Orthopedic Surgery

## 2024-07-27 DIAGNOSIS — M25511 Pain in right shoulder: Secondary | ICD-10-CM

## 2024-07-27 DIAGNOSIS — G8929 Other chronic pain: Secondary | ICD-10-CM | POA: Diagnosis not present

## 2024-07-27 DIAGNOSIS — S46011A Strain of muscle(s) and tendon(s) of the rotator cuff of right shoulder, initial encounter: Secondary | ICD-10-CM | POA: Diagnosis not present

## 2024-07-27 MED ORDER — IOPAMIDOL (ISOVUE-M 200) INJECTION 41%
10.0000 mL | Freq: Once | INTRAMUSCULAR | Status: AC
  Administered 2024-07-27 (×2): 10 mL via INTRA_ARTICULAR

## 2024-07-27 NOTE — Procedures (Signed)
 Radiology Procedure Note  Risks and benefits of joint injection were discussed with the patient including, but not limited to bleeding, infection, damage to adjacent structures, allergic reaction, and extraarticular contrast injection.    All of the patient's questions were answered, patient is agreeable to proceed. Consent signed and in chart.  A timeout was performed with all members of the team prior to start of the procedure. Correct patient and correct procedure was confirmed. Allergies were reviewed.   PROCEDURE SUMMARY:  Successful fluoro guided right shoulder arthrogram. No immediate complications.  Patient tolerated well.   EBL = trace  Please see full dictation in imaging section of Epic for procedure details.   Antwion Carpenter H Rhilyn Battle PA-C 07/27/2024 3:30 PM

## 2024-08-02 ENCOUNTER — Other Ambulatory Visit (HOSPITAL_COMMUNITY): Payer: Self-pay

## 2024-08-02 MED ORDER — BUPROPION HCL ER (XL) 300 MG PO TB24
300.0000 mg | ORAL_TABLET | Freq: Every day | ORAL | 0 refills | Status: DC
  Filled 2024-08-02: qty 90, 90d supply, fill #0

## 2024-08-04 DIAGNOSIS — F4322 Adjustment disorder with anxiety: Secondary | ICD-10-CM | POA: Diagnosis not present

## 2024-08-08 ENCOUNTER — Other Ambulatory Visit (HOSPITAL_COMMUNITY): Payer: Self-pay

## 2024-08-08 MED ORDER — TRAZODONE HCL 100 MG PO TABS
100.0000 mg | ORAL_TABLET | Freq: Every evening | ORAL | 0 refills | Status: DC
  Filled 2024-08-08: qty 90, 90d supply, fill #0

## 2024-08-11 ENCOUNTER — Other Ambulatory Visit (HOSPITAL_COMMUNITY): Payer: Self-pay

## 2024-08-12 ENCOUNTER — Other Ambulatory Visit: Payer: Self-pay

## 2024-08-12 DIAGNOSIS — G8929 Other chronic pain: Secondary | ICD-10-CM

## 2024-08-16 ENCOUNTER — Other Ambulatory Visit (HOSPITAL_COMMUNITY): Payer: Self-pay

## 2024-08-16 ENCOUNTER — Other Ambulatory Visit: Payer: Self-pay

## 2024-08-16 MED ORDER — LOSARTAN POTASSIUM-HCTZ 100-12.5 MG PO TABS
1.0000 | ORAL_TABLET | Freq: Every day | ORAL | 1 refills | Status: AC
  Filled 2024-08-16: qty 90, 90d supply, fill #0
  Filled 2024-11-20: qty 90, 90d supply, fill #1

## 2024-08-17 ENCOUNTER — Ambulatory Visit (INDEPENDENT_AMBULATORY_CARE_PROVIDER_SITE_OTHER): Admitting: Orthopedic Surgery

## 2024-08-17 ENCOUNTER — Encounter: Payer: Self-pay | Admitting: Orthopedic Surgery

## 2024-08-17 ENCOUNTER — Other Ambulatory Visit (HOSPITAL_COMMUNITY): Payer: Self-pay

## 2024-08-17 ENCOUNTER — Telehealth: Payer: Self-pay

## 2024-08-17 DIAGNOSIS — M170 Bilateral primary osteoarthritis of knee: Secondary | ICD-10-CM | POA: Diagnosis not present

## 2024-08-17 DIAGNOSIS — G8929 Other chronic pain: Secondary | ICD-10-CM

## 2024-08-17 NOTE — Telephone Encounter (Signed)
 Order placed for bil knee gel PA to be done in Jan per Dr Addie

## 2024-08-17 NOTE — Progress Notes (Signed)
 Office Visit Note   Patient: [name] [name]           Date of Birth: 04-10-1966           MRN: [id number] Visit Date: 08/17/2024 Requested by: Rolinda Millman, MD [phone] [address] [place],  [place] [address] PCP: Rolinda Millman, MD  Subjective: Chief Complaint  Patient presents with   Right Shoulder - Pain   Right Knee - Pain   Left Knee - Pain    HPI: [name] [name] is a 58 y.o. female who presents to the office reporting right shoulder pain in bilateral knee pain.  She had Monovisc injections in both knees over a month ago.  Also had cortisone injection into the right shoulder glenohumeral joint on 07/20/2024 which did not give her too much help.  She did have a vasovagal episode after that.  She likes to do Pilates and push-ups as part of her fitness routine.  Cannot take anti-inflammatories because of kidney and liver issues.  MRI scan shows mild rotator cuff tendinosis without tearing with severe tendinosis of the long head of the biceps tendon and severe arthritis of the glenohumeral joint..                ROS: All systems reviewed are negative as they relate to the chief complaint within the history of present illness.  Patient denies fevers or chills.  Assessment & Plan: Visit Diagnoses: No diagnosis found.  Plan: Impression is right shoulder arthritis and biceps tendinitis.  Plan at this time is to continue with his much motion to the shoulder as possible.  Would avoid heavy compressive loading exercises of the glenohumeral joint such as planks and push-ups.  She will come back in January and we will radiographs the knees at that time and get her set up for another round of gel injections.  She has good strength and well-maintained range of motion. Follow-Up Instructions: No follow-ups on file.   Orders:  No orders of the defined types were placed in this encounter.  No orders of the defined types were placed in this encounter.     Procedures: No procedures  performed   Clinical Data: No additional findings.  Objective: Vital Signs: There were no vitals taken for this visit.  Physical Exam:  Constitutional: Patient appears well-developed HEENT:  Head: Normocephalic Eyes:EOM are normal Neck: Normal range of motion Cardiovascular: Normal rate Pulmonary/chest: Effort normal Neurologic: Patient is alert Skin: Skin is warm Psychiatric: Patient has normal mood and affect  Ortho Exam: Ortho exam demonstrates passive range of motion on the right of 40/95/160. Passive range of motion of the left is 55/100/170. Patient has excellent rotator cuff strength infraspinatus supraspinatus and subscap muscle testing. No coarse grinding or crepitus with internal/external rotation of that right arm. No discrete AC joint tenderness on that right-hand side. Cervical spine range of motion full.   This patient is diagnosed with osteoarthritis of the knee(s).    Radiographs show evidence of joint space narrowing, osteophytes, subchondral sclerosis and/or subchondral cysts.  This patient has knee pain which interferes with functional and activities of daily living.    This patient has experienced inadequate response, adverse effects and/or intolerance with conservative treatments such as acetaminophen , NSAIDS, topical creams, physical therapy or regular exercise, knee bracing and/or weight loss.   This patient has experienced inadequate response or has a contraindication to intra articular steroid injections for at least 3 months.   This patient is not scheduled  to have a total knee replacement within 6 months of starting treatment with viscosupplementation.   Specialty Comments:  No specialty comments available.  Imaging: No results found.   PMFS History: Patient Active Problem List   Diagnosis Date Noted   Arthritis of both acromioclavicular joints 04/19/2024   Degenerative arthritis of knee, bilateral 04/19/2024   Derangement of posterior horn of  medial meniscus due to old tear or injury, right knee 05/13/2018   Chronic pain of right knee 03/15/2018   Chronic pain of left knee 03/15/2018   Unilateral primary osteoarthritis, left knee 03/15/2018   Unilateral primary osteoarthritis, right knee 03/15/2018   H/O shoulder surgery    Constipation 11/28/2015   Family history of colon cancer 11/28/2015   Rectal bleeding 11/28/2015   Low back pain 11/28/2015   Dysthymia 08/04/2012   ADD (attention deficit disorder) 08/04/2012   Sleep disturbance 08/04/2012   IC (interstitial cystitis) 08/04/2012   H/O hysterectomy for benign disease 04/07/2012   Past Medical History:  Diagnosis Date   ADD (attention deficit disorder)    Anxiety    Cervical radiculopathy 2014   L arm (into L4th and 5th fingers)   Depression    Endometriosis    Hypertension    on meds   Interstitial cystitis    OA (osteoarthritis)    PONV (postoperative nausea and vomiting)    Right knee meniscal tear    Wears glasses     Family History  Problem Relation Age of Onset   Colon polyps Mother    Heart disease Father    Stroke Father    Mental illness Father    Mental illness Sister    Stroke Brother    Diabetes Brother    Hyperlipidemia Brother    Colon cancer Maternal Uncle    Colon polyps Maternal Grandmother    Colon cancer Maternal Grandmother    Breast cancer Neg Hx    Stomach cancer Neg Hx    Rectal cancer Neg Hx    Esophageal cancer Neg Hx    Pancreatic cancer Neg Hx     Past Surgical History:  Procedure Laterality Date   BLADDER SURGERY     due to interstitial cystitis   BREAST REDUCTION SURGERY  1988   CHONDROPLASTY Right 05/11/2019   Procedure: CHONDROPLASTY;  Surgeon: Melodi Lerner, MD;  Location: [facility];  Service: Orthopedics;  Laterality: Right;   CLOSED MANIPULATION SHOULDER Right 11/15/2009   CYSTOSCOPY WITH HYDRODISTENSION AND BIOPSY  01-17-2004    dr alline  @[facility]    for IC   DIAGNOSTIC LAPAROSCOPY  1988;   05-28-2006   KNEE ARTHROSCOPY Bilateral right 1996;  left 11-02-2008   KNEE ARTHROSCOPY Right 05/11/2019   Procedure: ARTHROSCOPY KNEE, MEDIAL MENISCAL DEBRIDEMENT;  Surgeon: Melodi Lerner, MD;  Location: [facility];  Service: Orthopedics;  Laterality: Right;   KNEE ARTHROSCOPY WITH MEDIAL MENISECTOMY Right 08/13/2018   Procedure: RIGHT KNEE ARTHROSCOPY WITH DEBRIDEMENT chondroplasty;  Surgeon: Vernetta Lonni GRADE, MD;  Location: [facility];  Service: Orthopedics;  Laterality: Right;   REDUCTION MAMMAPLASTY     SHOULDER ARTHROSCOPY Bilateral right 08-07-2009;  let 04-17-2011   TOTAL ABDOMINAL HYSTERECTOMY W/ BILATERAL SALPINGOOPHORECTOMY  05-19-2007   dr jannetta  @[facility]    Social History   Occupational History   Not on file  Tobacco Use   Smoking status: Former    Current packs/day: 0.00    Types: Cigarettes    Start date: 08/04/1986    Quit date: 08/04/1990  Years since quitting: 34.0   Smokeless tobacco: Never  Vaping Use   Vaping status: Never Used  Substance and Sexual Activity   Alcohol use: Yes    Alcohol/week: 2.0 standard drinks of alcohol    Types: 2 Standard drinks or equivalent per week    Comment: rare   Drug use: No   Sexual activity: Yes    Birth control/protection: Surgical

## 2024-08-18 ENCOUNTER — Ambulatory Visit: Admitting: Family Medicine

## 2024-08-21 ENCOUNTER — Encounter: Payer: Self-pay | Admitting: Orthopedic Surgery

## 2024-08-25 ENCOUNTER — Other Ambulatory Visit (HOSPITAL_COMMUNITY): Payer: Self-pay

## 2024-09-07 ENCOUNTER — Other Ambulatory Visit (HOSPITAL_COMMUNITY): Payer: Self-pay

## 2024-09-07 ENCOUNTER — Other Ambulatory Visit: Payer: Self-pay

## 2024-09-07 ENCOUNTER — Other Ambulatory Visit: Payer: Self-pay | Admitting: Family Medicine

## 2024-09-07 DIAGNOSIS — Z79890 Hormone replacement therapy (postmenopausal): Secondary | ICD-10-CM | POA: Diagnosis not present

## 2024-09-07 DIAGNOSIS — Z0000 Encounter for general adult medical examination without abnormal findings: Secondary | ICD-10-CM | POA: Diagnosis not present

## 2024-09-07 DIAGNOSIS — G43009 Migraine without aura, not intractable, without status migrainosus: Secondary | ICD-10-CM | POA: Diagnosis not present

## 2024-09-07 DIAGNOSIS — Z13220 Encounter for screening for lipoid disorders: Secondary | ICD-10-CM | POA: Diagnosis not present

## 2024-09-07 DIAGNOSIS — Z1231 Encounter for screening mammogram for malignant neoplasm of breast: Secondary | ICD-10-CM

## 2024-09-07 DIAGNOSIS — I10 Essential (primary) hypertension: Secondary | ICD-10-CM | POA: Diagnosis not present

## 2024-09-07 DIAGNOSIS — Z7185 Encounter for immunization safety counseling: Secondary | ICD-10-CM | POA: Diagnosis not present

## 2024-09-07 DIAGNOSIS — G4700 Insomnia, unspecified: Secondary | ICD-10-CM | POA: Diagnosis not present

## 2024-09-07 DIAGNOSIS — F411 Generalized anxiety disorder: Secondary | ICD-10-CM | POA: Diagnosis not present

## 2024-09-07 DIAGNOSIS — F909 Attention-deficit hyperactivity disorder, unspecified type: Secondary | ICD-10-CM | POA: Diagnosis not present

## 2024-09-07 MED ORDER — LOSARTAN POTASSIUM-HCTZ 100-12.5 MG PO TABS: 1.0000 | ORAL_TABLET | Freq: Every day | ORAL | 1 refills | Status: AC

## 2024-09-07 MED ORDER — NURTEC 75 MG PO TBDP
75.0000 mg | ORAL_TABLET | Freq: Every day | ORAL | 0 refills | Status: DC | PRN
  Filled 2024-09-07: qty 4, 14d supply, fill #0

## 2024-09-07 MED ORDER — AMPHETAMINE-DEXTROAMPHET ER 15 MG PO CP24
15.0000 mg | ORAL_CAPSULE | Freq: Every morning | ORAL | 0 refills | Status: DC
  Filled 2024-09-07: qty 90, 90d supply, fill #0

## 2024-09-07 MED ORDER — BUPROPION HCL ER (XL) 300 MG PO TB24
300.0000 mg | ORAL_TABLET | Freq: Every day | ORAL | 1 refills | Status: AC
  Filled 2024-10-29: qty 90, 90d supply, fill #0

## 2024-09-07 MED ORDER — ESTRATEST H.S. 0.625-1.25 MG PO TABS
0.5000 | ORAL_TABLET | Freq: Every day | ORAL | 1 refills | Status: AC
  Filled 2024-09-21 – 2024-09-23 (×2): qty 45, 90d supply, fill #0
  Filled 2024-12-18: qty 45, 90d supply, fill #1

## 2024-09-13 DIAGNOSIS — Z23 Encounter for immunization: Secondary | ICD-10-CM | POA: Diagnosis not present

## 2024-09-21 ENCOUNTER — Other Ambulatory Visit (HOSPITAL_COMMUNITY): Payer: Self-pay

## 2024-09-21 ENCOUNTER — Other Ambulatory Visit: Payer: Self-pay

## 2024-09-23 ENCOUNTER — Other Ambulatory Visit (HOSPITAL_COMMUNITY): Payer: Self-pay

## 2024-10-17 ENCOUNTER — Encounter: Payer: Self-pay | Admitting: Radiology

## 2024-10-24 DIAGNOSIS — J029 Acute pharyngitis, unspecified: Secondary | ICD-10-CM | POA: Diagnosis not present

## 2024-10-24 DIAGNOSIS — H6502 Acute serous otitis media, left ear: Secondary | ICD-10-CM | POA: Diagnosis not present

## 2024-10-24 DIAGNOSIS — H66001 Acute suppurative otitis media without spontaneous rupture of ear drum, right ear: Secondary | ICD-10-CM | POA: Diagnosis not present

## 2024-10-24 DIAGNOSIS — J40 Bronchitis, not specified as acute or chronic: Secondary | ICD-10-CM | POA: Diagnosis not present

## 2024-10-24 DIAGNOSIS — J329 Chronic sinusitis, unspecified: Secondary | ICD-10-CM | POA: Diagnosis not present

## 2024-10-24 DIAGNOSIS — H6123 Impacted cerumen, bilateral: Secondary | ICD-10-CM | POA: Diagnosis not present

## 2024-10-31 ENCOUNTER — Other Ambulatory Visit: Payer: Self-pay

## 2024-10-31 ENCOUNTER — Other Ambulatory Visit (HOSPITAL_COMMUNITY): Payer: Self-pay

## 2024-10-31 MED ORDER — FLUCONAZOLE 150 MG PO TABS
150.0000 mg | ORAL_TABLET | Freq: Every day | ORAL | 0 refills | Status: AC
  Filled 2024-10-31 (×2): qty 2, 2d supply, fill #0

## 2024-11-07 ENCOUNTER — Other Ambulatory Visit (HOSPITAL_COMMUNITY): Payer: Self-pay

## 2024-11-08 ENCOUNTER — Other Ambulatory Visit (HOSPITAL_COMMUNITY): Payer: Self-pay

## 2024-11-08 MED ORDER — TRAZODONE HCL 100 MG PO TABS
100.0000 mg | ORAL_TABLET | Freq: Every day | ORAL | 0 refills | Status: AC
  Filled 2024-11-08: qty 90, 90d supply, fill #0

## 2024-11-22 ENCOUNTER — Other Ambulatory Visit (HOSPITAL_COMMUNITY): Payer: Self-pay

## 2024-11-22 ENCOUNTER — Other Ambulatory Visit: Payer: Self-pay

## 2024-11-22 ENCOUNTER — Ambulatory Visit
Admission: RE | Admit: 2024-11-22 | Discharge: 2024-11-22 | Disposition: A | Source: Ambulatory Visit | Attending: Family Medicine

## 2024-11-22 DIAGNOSIS — Z1231 Encounter for screening mammogram for malignant neoplasm of breast: Secondary | ICD-10-CM

## 2024-11-22 MED ORDER — NURTEC 75 MG PO TBDP: 75.0000 mg | ORAL_TABLET | Freq: Every day | ORAL | 0 refills | Status: AC | PRN

## 2024-11-22 MED ORDER — NURTEC 75 MG PO TBDP
75.0000 mg | ORAL_TABLET | Freq: Every day | ORAL | 0 refills | Status: AC | PRN
  Filled 2024-11-22: qty 8, 30d supply, fill #0
  Filled 2024-11-22: qty 8, 16d supply, fill #0

## 2024-12-06 ENCOUNTER — Other Ambulatory Visit (HOSPITAL_COMMUNITY): Payer: Self-pay

## 2024-12-06 ENCOUNTER — Other Ambulatory Visit: Payer: Self-pay

## 2024-12-06 MED ORDER — AMPHETAMINE-DEXTROAMPHET ER 15 MG PO CP24
15.0000 mg | ORAL_CAPSULE | ORAL | 0 refills | Status: AC
  Filled 2024-12-06: qty 90, 90d supply, fill #0

## 2024-12-19 ENCOUNTER — Other Ambulatory Visit (HOSPITAL_COMMUNITY): Payer: Self-pay

## 2024-12-20 ENCOUNTER — Other Ambulatory Visit (HOSPITAL_COMMUNITY): Payer: Self-pay

## 2024-12-21 ENCOUNTER — Ambulatory Visit: Admitting: Orthopedic Surgery
